# Patient Record
Sex: Male | Born: 1945 | Race: White | Hispanic: No | Marital: Married | Smoking: Former smoker
Health system: Southern US, Community
[De-identification: ages and names within clinical notes are randomized; demographics above are authoritative.]

## PROBLEM LIST (undated history)

## (undated) DIAGNOSIS — N400 Enlarged prostate without lower urinary tract symptoms: Secondary | ICD-10-CM

## (undated) DIAGNOSIS — N183 Chronic kidney disease, stage 3 unspecified: Secondary | ICD-10-CM

## (undated) DIAGNOSIS — I219 Acute myocardial infarction, unspecified: Secondary | ICD-10-CM

## (undated) DIAGNOSIS — E119 Type 2 diabetes mellitus without complications: Secondary | ICD-10-CM

## (undated) DIAGNOSIS — E1142 Type 2 diabetes mellitus with diabetic polyneuropathy: Secondary | ICD-10-CM

## (undated) DIAGNOSIS — I129 Hypertensive chronic kidney disease with stage 1 through stage 4 chronic kidney disease, or unspecified chronic kidney disease: Secondary | ICD-10-CM

## (undated) DIAGNOSIS — I10 Essential (primary) hypertension: Secondary | ICD-10-CM

## (undated) DIAGNOSIS — I519 Heart disease, unspecified: Secondary | ICD-10-CM

## (undated) DIAGNOSIS — E785 Hyperlipidemia, unspecified: Secondary | ICD-10-CM

## (undated) HISTORY — PX: HERNIA REPAIR: SHX51

## (undated) HISTORY — DX: Type 2 diabetes mellitus without complications: E11.9

## (undated) HISTORY — DX: Essential (primary) hypertension: I10

## (undated) HISTORY — DX: Type 2 diabetes mellitus with diabetic polyneuropathy: E11.42

## (undated) HISTORY — DX: Heart disease, unspecified: I51.9

## (undated) HISTORY — DX: Hyperlipidemia, unspecified: E78.5

## (undated) HISTORY — DX: Chronic kidney disease, stage 3 unspecified: N18.30

## (undated) HISTORY — DX: Chronic kidney disease, stage 3 (moderate): N18.3

## (undated) HISTORY — DX: Acute myocardial infarction, unspecified: I21.9

## (undated) HISTORY — PX: CARDIAC SURGERY: SHX584

## (undated) HISTORY — DX: Hypertensive chronic kidney disease with stage 1 through stage 4 chronic kidney disease, or unspecified chronic kidney disease: I12.9

## (undated) HISTORY — DX: Benign prostatic hyperplasia without lower urinary tract symptoms: N40.0

## (undated) HISTORY — PX: ANGIOPLASTY: SHX39

---

## 1989-04-07 HISTORY — PX: CORONARY ARTERY BYPASS GRAFT: SHX141

## 2011-12-30 DIAGNOSIS — I10 Essential (primary) hypertension: Secondary | ICD-10-CM | POA: Insufficient documentation

## 2013-04-27 ENCOUNTER — Ambulatory Visit (INDEPENDENT_AMBULATORY_CARE_PROVIDER_SITE_OTHER): Payer: Medicare Other

## 2013-04-27 ENCOUNTER — Ambulatory Visit (INDEPENDENT_AMBULATORY_CARE_PROVIDER_SITE_OTHER): Payer: Medicare Other | Admitting: Podiatry

## 2013-04-27 ENCOUNTER — Encounter: Payer: Self-pay | Admitting: Podiatry

## 2013-04-27 VITALS — BP 109/66 | HR 67 | Resp 16 | Ht 67.0 in | Wt 145.0 lb

## 2013-04-27 DIAGNOSIS — M79673 Pain in unspecified foot: Secondary | ICD-10-CM

## 2013-04-27 DIAGNOSIS — M79609 Pain in unspecified limb: Secondary | ICD-10-CM

## 2013-04-27 DIAGNOSIS — E1149 Type 2 diabetes mellitus with other diabetic neurological complication: Secondary | ICD-10-CM

## 2013-04-27 MED ORDER — GABAPENTIN 300 MG PO CAPS: ORAL_CAPSULE | ORAL | Status: DC

## 2013-04-27 NOTE — Progress Notes (Signed)
   Subjective:    Patient ID: Matthew CreaseJerry Wolf, male    DOB: 01-11-1946, 68 y.o.   MRN: [id number]  HPI Comments: My toes on both of my feet hurt. They have been hurting since 2011 and they have gotten worse over the years. i took lyrica for my feet and it didn't help. Dr Al Corpushyatt gave me neurontin and mobic and it didn't help either. i lay on my stomach and let my feet hang straight down and that gives me some relief. i also soak in epson salt and that's it.  Foot Pain      Review of Systems  Constitutional: Negative.   HENT: Negative.   Eyes: Positive for itching.  Respiratory: Negative.   Cardiovascular: Negative.   Gastrointestinal: Negative.   Endocrine: Negative.   Genitourinary: Negative.   Musculoskeletal: Negative.   Skin: Negative.   Allergic/Immunologic: Negative.   Neurological: Negative.   Hematological: Negative.   Psychiatric/Behavioral: Negative.        Objective:   Physical Exam: I have reviewed his past medical history medications allergies surgeries and social history. Pulses are strongly palpable bilateral. Neurologic sensorium per Semmes-Weinstein monofilament is diminished to the toes bilateral. Deep tendon reflexes are in non-elicitable. Muscle strength is 5 over 5 dorsiflexors plantar flexors inverters everters all intrinsic musculature is intact. Orthopedic evaluation demonstrates all joints distal to the ankle have a full range of motion without crepitus he does have hallux interphalangeum as well as hammertoe deformities 2 through 5 bilateral. Cutaneous evaluation demonstrates supple well hydrated cutis there is no erythema edema saline is drainage or odor.        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  Plan: Discussed etiology pathology conservative versus surgical therapies. At this point we are going to restart him on Neurontin. Neurontin 300 mg 1 twice daily and I will followup with him in one  month.

## 2013-05-25 ENCOUNTER — Ambulatory Visit (INDEPENDENT_AMBULATORY_CARE_PROVIDER_SITE_OTHER): Payer: Medicare Other | Admitting: Podiatry

## 2013-05-25 VITALS — BP 111/63 | HR 70 | Resp 16 | Ht 66.0 in | Wt 145.0 lb

## 2013-05-25 DIAGNOSIS — E1149 Type 2 diabetes mellitus with other diabetic neurological complication: Secondary | ICD-10-CM

## 2013-05-25 MED ORDER — GABAPENTIN 300 MG PO CAPS: ORAL_CAPSULE | ORAL | Status: DC

## 2013-05-25 NOTE — Progress Notes (Signed)
He has been taking his gabapentin twice daily, 1 300 mg capsule at lunch time and another 300 mg capsule at bedtime. He states that he really has not noticed a big difference other than being able to sleep through the night.  Objective: Vital signs are stable he is alert and oriented x3 pulses are strongly palpable bilateral foot. No change in neurological status.  Assessment: Diabetic peripheral neuropathy right greater than left.  Plan: Discussed etiology pathology conservative versus surgical therapies I increase his gabapentin to 600 mg twice daily and I will followup with him in one month at which time we will discuss the possible need for orthotics do his job type.

## 2013-06-27 ENCOUNTER — Ambulatory Visit: Payer: Medicare Other | Admitting: Podiatry

## 2013-07-26 ENCOUNTER — Ambulatory Visit: Payer: Self-pay | Admitting: Urology

## 2013-08-04 ENCOUNTER — Ambulatory Visit: Payer: Self-pay | Admitting: Gastroenterology

## 2013-08-04 LAB — HM COLONOSCOPY

## 2013-08-05 LAB — PATHOLOGY REPORT

## 2014-01-05 ENCOUNTER — Other Ambulatory Visit: Payer: Self-pay | Admitting: *Deleted

## 2014-01-05 MED ORDER — GABAPENTIN 300 MG PO CAPS: ORAL_CAPSULE | ORAL | Status: DC

## 2014-01-05 NOTE — Telephone Encounter (Signed)
Ok to refill gabapentin 

## 2014-07-29 ENCOUNTER — Other Ambulatory Visit: Payer: Self-pay | Admitting: Podiatry

## 2014-08-16 ENCOUNTER — Ambulatory Visit
Admission: RE | Admit: 2014-08-16 | Discharge: 2014-08-16 | Disposition: A | Discharge status: Home / Self Care | Payer: Medicare Other | Source: Ambulatory Visit | Attending: Family Medicine | Admitting: Family Medicine

## 2014-08-16 ENCOUNTER — Other Ambulatory Visit: Payer: Self-pay | Admitting: Family Medicine

## 2014-08-16 DIAGNOSIS — R6883 Chills (without fever): Secondary | ICD-10-CM | POA: Diagnosis not present

## 2014-08-16 DIAGNOSIS — R05 Cough: Secondary | ICD-10-CM | POA: Insufficient documentation

## 2014-08-16 DIAGNOSIS — Z951 Presence of aortocoronary bypass graft: Secondary | ICD-10-CM | POA: Diagnosis not present

## 2014-08-16 DIAGNOSIS — N183 Chronic kidney disease, stage 3 (moderate): Secondary | ICD-10-CM | POA: Diagnosis not present

## 2014-08-16 DIAGNOSIS — R059 Cough, unspecified: Secondary | ICD-10-CM

## 2014-08-16 DIAGNOSIS — D72829 Elevated white blood cell count, unspecified: Secondary | ICD-10-CM | POA: Diagnosis not present

## 2014-08-16 DIAGNOSIS — R509 Fever, unspecified: Secondary | ICD-10-CM | POA: Diagnosis not present

## 2014-08-16 DIAGNOSIS — M791 Myalgia: Secondary | ICD-10-CM | POA: Diagnosis not present

## 2014-08-16 DIAGNOSIS — E1129 Type 2 diabetes mellitus with other diabetic kidney complication: Secondary | ICD-10-CM | POA: Diagnosis not present

## 2014-08-16 DIAGNOSIS — R634 Abnormal weight loss: Secondary | ICD-10-CM | POA: Diagnosis not present

## 2014-08-18 DIAGNOSIS — I129 Hypertensive chronic kidney disease with stage 1 through stage 4 chronic kidney disease, or unspecified chronic kidney disease: Secondary | ICD-10-CM | POA: Diagnosis not present

## 2014-08-18 DIAGNOSIS — N182 Chronic kidney disease, stage 2 (mild): Secondary | ICD-10-CM | POA: Diagnosis not present

## 2014-08-18 DIAGNOSIS — E1140 Type 2 diabetes mellitus with diabetic neuropathy, unspecified: Secondary | ICD-10-CM | POA: Diagnosis not present

## 2014-08-18 DIAGNOSIS — E785 Hyperlipidemia, unspecified: Secondary | ICD-10-CM | POA: Diagnosis not present

## 2014-09-04 DIAGNOSIS — H3531 Nonexudative age-related macular degeneration: Secondary | ICD-10-CM | POA: Diagnosis not present

## 2014-09-04 DIAGNOSIS — H40003 Preglaucoma, unspecified, bilateral: Secondary | ICD-10-CM | POA: Diagnosis not present

## 2014-10-12 ENCOUNTER — Telehealth: Payer: Self-pay | Admitting: Unknown Physician Specialty

## 2014-10-12 NOTE — Telephone Encounter (Signed)
Routing to provider. Elnita MaxwellCheryl wrote this rx for the patient but Dr. Sherie DonLada is the PCP according to practice partner. Practice partner number is [phone], pharmacy [facility], and patient was last seen on 08/18/14 with Dr. Sherie DonLada.

## 2014-10-12 NOTE — Telephone Encounter (Signed)
I do believe this is your patient. Sent to you!

## 2014-10-12 NOTE — Telephone Encounter (Signed)
[facility] [place] [facility] stated pt needs refill on: Metformin.   Thanks.

## 2014-10-13 MED ORDER — METFORMIN HCL 1000 MG PO TABS: 1000.0000 mg | ORAL_TABLET | Freq: Two times a day (BID) | ORAL | Status: DC

## 2014-10-13 NOTE — Telephone Encounter (Signed)
Last creatinine 0.82; approved

## 2014-11-20 ENCOUNTER — Other Ambulatory Visit: Payer: Self-pay | Admitting: Family Medicine

## 2014-11-20 NOTE — Telephone Encounter (Signed)
E-fax came through for refill on: Rx: metFORMIN (GLUCOPHAGE) 1000 MG tablet Copy in basket.

## 2014-11-21 MED ORDER — METFORMIN HCL 1000 MG PO TABS: 1000.0000 mg | ORAL_TABLET | Freq: Two times a day (BID) | ORAL | Status: DC

## 2014-11-21 NOTE — Telephone Encounter (Signed)
He would like rx for Metformin changed to a 90 day supply.

## 2014-11-21 NOTE — Telephone Encounter (Signed)
Last creatinine reviewed; he has appt August 25th (next week) Rx approved

## 2014-11-23 DIAGNOSIS — I10 Essential (primary) hypertension: Secondary | ICD-10-CM | POA: Insufficient documentation

## 2014-11-23 DIAGNOSIS — E1142 Type 2 diabetes mellitus with diabetic polyneuropathy: Secondary | ICD-10-CM | POA: Insufficient documentation

## 2014-11-23 DIAGNOSIS — N182 Chronic kidney disease, stage 2 (mild): Secondary | ICD-10-CM

## 2014-11-23 DIAGNOSIS — I152 Hypertension secondary to endocrine disorders: Secondary | ICD-10-CM | POA: Insufficient documentation

## 2014-11-23 DIAGNOSIS — E1122 Type 2 diabetes mellitus with diabetic chronic kidney disease: Secondary | ICD-10-CM | POA: Insufficient documentation

## 2014-11-23 DIAGNOSIS — E785 Hyperlipidemia, unspecified: Secondary | ICD-10-CM

## 2014-11-23 DIAGNOSIS — I129 Hypertensive chronic kidney disease with stage 1 through stage 4 chronic kidney disease, or unspecified chronic kidney disease: Secondary | ICD-10-CM | POA: Insufficient documentation

## 2014-11-23 DIAGNOSIS — E119 Type 2 diabetes mellitus without complications: Secondary | ICD-10-CM | POA: Insufficient documentation

## 2014-11-23 DIAGNOSIS — E1169 Type 2 diabetes mellitus with other specified complication: Secondary | ICD-10-CM | POA: Insufficient documentation

## 2014-11-30 ENCOUNTER — Ambulatory Visit (INDEPENDENT_AMBULATORY_CARE_PROVIDER_SITE_OTHER): Payer: Medicare Other | Admitting: Family Medicine

## 2014-11-30 ENCOUNTER — Encounter: Payer: Self-pay | Admitting: Family Medicine

## 2014-11-30 VITALS — BP 111/67 | HR 62 | Temp 97.9°F | Ht 65.75 in | Wt 139.0 lb

## 2014-11-30 DIAGNOSIS — E785 Hyperlipidemia, unspecified: Secondary | ICD-10-CM | POA: Diagnosis not present

## 2014-11-30 DIAGNOSIS — I10 Essential (primary) hypertension: Secondary | ICD-10-CM

## 2014-11-30 DIAGNOSIS — G629 Polyneuropathy, unspecified: Secondary | ICD-10-CM | POA: Diagnosis not present

## 2014-11-30 DIAGNOSIS — E1342 Other specified diabetes mellitus with diabetic polyneuropathy: Secondary | ICD-10-CM | POA: Diagnosis not present

## 2014-11-30 DIAGNOSIS — N183 Chronic kidney disease, stage 3 unspecified: Secondary | ICD-10-CM

## 2014-11-30 DIAGNOSIS — Z5181 Encounter for therapeutic drug level monitoring: Secondary | ICD-10-CM | POA: Diagnosis not present

## 2014-11-30 DIAGNOSIS — E1142 Type 2 diabetes mellitus with diabetic polyneuropathy: Secondary | ICD-10-CM

## 2014-11-30 DIAGNOSIS — R748 Abnormal levels of other serum enzymes: Secondary | ICD-10-CM

## 2014-11-30 DIAGNOSIS — D72829 Elevated white blood cell count, unspecified: Secondary | ICD-10-CM | POA: Diagnosis not present

## 2014-11-30 DIAGNOSIS — I2510 Atherosclerotic heart disease of native coronary artery without angina pectoris: Secondary | ICD-10-CM

## 2014-11-30 MED ORDER — GABAPENTIN 600 MG PO TABS: 600.0000 mg | ORAL_TABLET | Freq: Three times a day (TID) | ORAL | Status: DC

## 2014-11-30 NOTE — Assessment & Plan Note (Signed)
Elevated when sick last visit; recheck to ensure return to baseline

## 2014-11-30 NOTE — Patient Instructions (Addendum)
For the next 5 days, take one gabapentin pill in the morning, two in the early afternoon, and two at bedtime Then increase to 600 mg three times a day New prescription was sent to your pharmacy Do not ever stop the gabapentin cold [place] Return next week one morning for fasting labs for cholesterol and diabetes Check feet every night See Dr. Clydene Pugh Try to limit salt Return in 3-1/2 months (about 14 weeks) for your next follow-up for diabetes if your A1C is 7 or higher, or we can see you back in 6-1/2 months if A1C is less than 6; we'll see what your labs show

## 2014-11-30 NOTE — Assessment & Plan Note (Signed)
Check fasting lipids next week; ideal LDL less than 70; limit saturated fats; continue statin

## 2014-11-30 NOTE — Progress Notes (Signed)
BP 111/67 mmHg  Pulse 62  Temp(Src) 97.9 F (36.6 C)  Ht 5' 5.75" (1.67 m)  Wt 139 lb (63.05 kg)  BMI 22.61 kg/m2  SpO2 97%   Subjective:    Patient ID: Matthew Wolf, male    DOB: 06-Oct-1945, 69 y.o.   MRN: [id number]  HPI: Matthew Wolf is a 69 y.o. male  Chief Complaint  Patient presents with  . Hypertension  . Hyperlipidemia  . Diabetes    Diabetes; not checking FSBS per my blessing; some dry mouth; no frequent urination; some blurred vision yesterday with stress; neuropathy in both feet, worse on the bottom, burning sensation; already on 600 mg of gabepentin BID; last eye exam UTD with Dr. Clydene Pugh, sees him every 6 months  High blood pressure; well-controlled today; does use some salt; avoids decongestants; uses Aleve every once in a while, not very often  High cholesterol; on statin; no muscle aches or abd pain; no hot dogs, bologna; might eat two eggs a week; just doesn't eat steak, maybe one a month  Coronary artery disease; he has been on 80 mg of atorvastatin ever since after his heart attack; no chest pain; [facility]son [facility] at [facility]; goes to see him December 1st  Relevant past medical, surgical, family and social history reviewed and updated as indicated. Interim medical history since our last visit reviewed. Allergies and medications reviewed and updated.  Review of Systems  Constitutional: Negative for unexpected weight change.  HENT: Negative for hearing loss.   Eyes: Positive for visual disturbance (blurred vision for just a bit yesterday; sees Dr. Clydene Pugh every 6 months).  Respiratory: Negative for wheezing.   Cardiovascular: Negative for chest pain and leg swelling.  Gastrointestinal: Negative for blood in stool.  Endocrine: Negative for cold intolerance and polydipsia.  Genitourinary: Negative for dysuria.  Skin: Negative for wound.  Allergic/Immunologic: Negative for environmental allergies.  Neurological: Positive for numbness (burning in both feet).   Hematological: Does not bruise/bleed easily.  Psychiatric/Behavioral: Negative for sleep disturbance.    Per HPI unless specifically indicated above     Objective:    BP 111/67 mmHg  Pulse 62  Temp(Src) 97.9 F (36.6 C)  Ht 5' 5.75" (1.67 m)  Wt 139 lb (63.05 kg)  BMI 22.61 kg/m2  SpO2 97%  Wt Readings from Last 3 Encounters:  11/30/14 139 lb (63.05 kg)  08/18/14 140 lb (63.504 kg)  05/25/13 145 lb (65.772 kg)    Physical Exam  Constitutional: He appears well-developed and well-nourished. No distress.  Small framed; feet size 5  HENT:  Head: Normocephalic and atraumatic.  Eyes: EOM are normal. No scleral icterus.  Neck: No thyromegaly present.  Cardiovascular: Normal rate and regular rhythm.   Pulmonary/Chest: Effort normal and breath sounds normal.  Abdominal: Soft. Bowel sounds are normal. He exhibits no distension.  Musculoskeletal: He exhibits no edema.  Neurological: Coordination normal.  Skin: Skin is warm and dry. No pallor.  Psychiatric: He has a normal mood and affect. His behavior is normal. Judgment and thought content normal.   Diabetic Foot Form - Detailed   Diabetic Foot Exam - detailed  Diabetic Foot exam was performed with the following findings:  Yes 11/30/2014  4:57 PM  Visual Foot Exam completed.:  Yes  Is there a history of foot ulcer?:  No  Can the patient see the bottom of their feet?:  Yes  Are the shoes appropriate in style and fit?:  Yes  Is there swelling or and  abnormal foot shape?:  No  Are the toenails long?:  No  Are the toenails thick?:  No  Are the toenails ingrown?:  No    Pulse Foot Exam completed.:  Yes  Right Dorsalis Pedis:  Present Left Dorsalis Pedis:  Present  Sensory Foot Exam Completed.:  Yes  Semmes-Weinstein Monofilament Test  R Site 1-Great Toe:  Neg L Site 1-Great Toe:  Neg  R Site 4:  Neg L Site 4:  Neg  R Site 5:  Neg L Site 5:  Neg       Results for orders placed or performed in visit on 11/23/14  HM  COLONOSCOPY  Result Value Ref Range   HM Colonoscopy per PP, repeat in 5 years       Assessment & Plan:   Problem List Items Addressed This Visit      Cardiovascular and Mediastinum   Essential hypertension, benign    Well-controlled on beta-blocker and ACE-I; patient does not want to decrease the dose; he is well-controlled despite not limiting salt much; avoid decongestants      Coronary artery disease    No chest pain; on aspirin, statin, beta-blocker; sees cardiologist yearly        Endocrine   Diabetic peripheral neuropathy - Primary    Increase the gabapentin from 0-600-600 to 300-600-600 for five days and then 600-600-600; new Rx sent; explained to never ever stop this medicine cold [place], could cause seizure; will check A1C to see if better controlled; A1C less than 7.5 at least, and under 7 would be ideal; foot exam by MD today; patient checking feet regularly; he sees eye doctor every six months      Relevant Medications   gabapentin (NEURONTIN) 600 MG tablet   Other Relevant Orders   Hgb A1c w/o eAG   Microalbumin / creatinine urine ratio     Genitourinary   CKD (chronic kidney disease) stage 3, GFR 30-59 ml/min    Check creatinine and GFR; continue ACE-I; he uses NSAID very sparingly      Relevant Orders   Microalbumin / creatinine urine ratio     Other   Hyperlipidemia    Check fasting lipids next week; ideal LDL less than 70; limit saturated fats; continue statin      Relevant Orders   Lipid Panel w/o Chol/HDL Ratio   Medication monitoring encounter    Check sgpt and creatinine and K+ (on statin and ACE-I, respectively)      Relevant Orders   Comprehensive metabolic panel   Alkaline phosphatase elevation    Check level, very mild elevation last time when he was ill      Relevant Orders   Comprehensive metabolic panel   Leukocytosis    Elevated when sick last visit; recheck to ensure return to baseline      Relevant Orders   CBC with  Differential/Platelet      Follow up plan: Return in about 14 weeks (around 03/08/2015) for diabetes and cholesterol.   Meds ordered this encounter  Medications  . gabapentin (NEURONTIN) 600 MG tablet    Sig: Take 1 tablet (600 mg total) by mouth 3 (three) times daily.    Dispense:  90 tablet    Refill:  3    Pharmacist -- I'm taking this over and increasing dose   Orders Placed This Encounter  Procedures  . Hgb A1c w/o eAG  . Comprehensive metabolic panel  . CBC with Differential/Platelet  . Lipid Panel w/o Chol/HDL Ratio  .  Microalbumin / creatinine urine ratio

## 2014-11-30 NOTE — Assessment & Plan Note (Signed)
Check sgpt and creatinine and K+ (on statin and ACE-I, respectively)

## 2014-11-30 NOTE — Assessment & Plan Note (Signed)
Well-controlled on beta-blocker and ACE-I; patient does not want to decrease the dose; he is well-controlled despite not limiting salt much; avoid decongestants

## 2014-11-30 NOTE — Assessment & Plan Note (Signed)
Check creatinine and GFR; continue ACE-I; he uses NSAID very sparingly

## 2014-11-30 NOTE — Assessment & Plan Note (Signed)
Check level, very mild elevation last time when he was ill

## 2014-11-30 NOTE — Assessment & Plan Note (Addendum)
No chest pain; on aspirin, statin, beta-blocker; sees cardiologist yearly

## 2014-11-30 NOTE — Assessment & Plan Note (Signed)
Increase the gabapentin from 0-600-600 to 300-600-600 for five days and then 600-600-600; new Rx sent; explained to never ever stop this medicine cold [place], could cause seizure; will check A1C to see if better controlled; A1C less than 7.5 at least, and under 7 would be ideal; foot exam by MD today; patient checking feet regularly; he sees eye doctor every six months

## 2014-12-04 ENCOUNTER — Other Ambulatory Visit: Payer: Medicare Other

## 2014-12-04 DIAGNOSIS — E785 Hyperlipidemia, unspecified: Secondary | ICD-10-CM

## 2014-12-04 DIAGNOSIS — G629 Polyneuropathy, unspecified: Secondary | ICD-10-CM | POA: Diagnosis not present

## 2014-12-04 DIAGNOSIS — E1142 Type 2 diabetes mellitus with diabetic polyneuropathy: Secondary | ICD-10-CM

## 2014-12-04 DIAGNOSIS — Z5181 Encounter for therapeutic drug level monitoring: Secondary | ICD-10-CM

## 2014-12-04 DIAGNOSIS — D72829 Elevated white blood cell count, unspecified: Secondary | ICD-10-CM

## 2014-12-04 DIAGNOSIS — R748 Abnormal levels of other serum enzymes: Secondary | ICD-10-CM | POA: Diagnosis not present

## 2014-12-04 DIAGNOSIS — E1342 Other specified diabetes mellitus with diabetic polyneuropathy: Secondary | ICD-10-CM | POA: Diagnosis not present

## 2014-12-04 DIAGNOSIS — N183 Chronic kidney disease, stage 3 unspecified: Secondary | ICD-10-CM

## 2014-12-05 ENCOUNTER — Telehealth: Payer: Self-pay | Admitting: Family Medicine

## 2014-12-05 LAB — CBC WITH DIFFERENTIAL/PLATELET
BASOS ABS: 0 10*3/uL (ref 0.0–0.2)
Basos: 1 %
EOS (ABSOLUTE): 0.2 10*3/uL (ref 0.0–0.4)
EOS: 3 %
Hematocrit: 39.8 % (ref 37.5–51.0)
Hemoglobin: 13.8 g/dL (ref 12.6–17.7)
IMMATURE GRANULOCYTES: 0 %
Immature Grans (Abs): 0 10*3/uL (ref 0.0–0.1)
Lymphocytes Absolute: 2.6 10*3/uL (ref 0.7–3.1)
Lymphs: 31 %
MCH: 32 pg (ref 26.6–33.0)
MCHC: 34.7 g/dL (ref 31.5–35.7)
MCV: 92 fL (ref 79–97)
MONOS ABS: 0.6 10*3/uL (ref 0.1–0.9)
Monocytes: 7 %
NEUTROS PCT: 58 %
Neutrophils Absolute: 4.7 10*3/uL (ref 1.4–7.0)
PLATELETS: 241 10*3/uL (ref 150–379)
RBC: 4.31 x10E6/uL (ref 4.14–5.80)
RDW: 13.1 % (ref 12.3–15.4)
WBC: 8.2 10*3/uL (ref 3.4–10.8)

## 2014-12-05 LAB — COMPREHENSIVE METABOLIC PANEL
ALK PHOS: 104 IU/L (ref 39–117)
ALT: 18 IU/L (ref 0–44)
AST: 15 IU/L (ref 0–40)
Albumin/Globulin Ratio: 2 (ref 1.1–2.5)
Albumin: 4.5 g/dL (ref 3.5–4.8)
BUN/Creatinine Ratio: 12 (ref 10–22)
BUN: 10 mg/dL (ref 8–27)
Bilirubin Total: 0.4 mg/dL (ref 0.0–1.2)
CHLORIDE: 100 mmol/L (ref 97–108)
CO2: 21 mmol/L (ref 18–29)
CREATININE: 0.83 mg/dL (ref 0.76–1.27)
Calcium: 9.6 mg/dL (ref 8.6–10.2)
GFR calc Af Amer: 102 mL/min/{1.73_m2} (ref >59)
GFR calc non Af Amer: 89 mL/min/{1.73_m2} (ref >59)
GLOBULIN, TOTAL: 2.2 g/dL (ref 1.5–4.5)
Glucose: 183 mg/dL — ABNORMAL HIGH (ref 65–99)
POTASSIUM: 4.9 mmol/L (ref 3.5–5.2)
SODIUM: 140 mmol/L (ref 134–144)
Total Protein: 6.7 g/dL (ref 6.0–8.5)

## 2014-12-05 LAB — MICROALBUMIN / CREATININE URINE RATIO
CREATININE, UR: 86.7 mg/dL
MICROALB/CREAT RATIO: 29 mg/g creat (ref 0.0–30.0)
MICROALBUM., U, RANDOM: 25.1 ug/mL

## 2014-12-05 LAB — LIPID PANEL W/O CHOL/HDL RATIO
Cholesterol, Total: 124 mg/dL (ref 100–199)
HDL: 34 mg/dL — AB (ref >39)
LDL Calculated: 64 mg/dL (ref 0–99)
Triglycerides: 130 mg/dL (ref 0–149)
VLDL CHOLESTEROL CAL: 26 mg/dL (ref 5–40)

## 2014-12-05 LAB — HGB A1C W/O EAG: Hgb A1c MFr Bld: 8 % — ABNORMAL HIGH (ref 4.8–5.6)

## 2014-12-05 NOTE — Telephone Encounter (Signed)
I talked with patient about lab results His A1C is 8, down from 8.8 in May; recheck in 3 months Offered another medicine, but he declined and will stay on the two he's got and avoid simple carbs; reviewed those in detail He does not want additional diabetic teaching LDL better; HDL not quite high enough, try walking but don't be extreme

## 2015-02-17 ENCOUNTER — Other Ambulatory Visit: Payer: Self-pay | Admitting: Family Medicine

## 2015-02-19 NOTE — Telephone Encounter (Signed)
Your patient.  Thanks 

## 2015-02-19 NOTE — Telephone Encounter (Signed)
Creatinine 0.83 August 2016; Rx approved

## 2015-03-09 ENCOUNTER — Ambulatory Visit: Payer: Medicare Other | Admitting: Family Medicine

## 2015-03-09 DIAGNOSIS — I2510 Atherosclerotic heart disease of native coronary artery without angina pectoris: Secondary | ICD-10-CM | POA: Diagnosis not present

## 2015-03-09 DIAGNOSIS — E782 Mixed hyperlipidemia: Secondary | ICD-10-CM | POA: Diagnosis not present

## 2015-03-09 DIAGNOSIS — I10 Essential (primary) hypertension: Secondary | ICD-10-CM | POA: Diagnosis not present

## 2015-03-12 ENCOUNTER — Ambulatory Visit (INDEPENDENT_AMBULATORY_CARE_PROVIDER_SITE_OTHER): Payer: Medicare Other | Admitting: Family Medicine

## 2015-03-12 ENCOUNTER — Encounter: Payer: Self-pay | Admitting: Family Medicine

## 2015-03-12 VITALS — BP 125/77 | HR 55 | Temp 97.3°F | Wt 137.0 lb

## 2015-03-12 DIAGNOSIS — I129 Hypertensive chronic kidney disease with stage 1 through stage 4 chronic kidney disease, or unspecified chronic kidney disease: Secondary | ICD-10-CM | POA: Diagnosis not present

## 2015-03-12 DIAGNOSIS — I10 Essential (primary) hypertension: Secondary | ICD-10-CM | POA: Diagnosis not present

## 2015-03-12 DIAGNOSIS — Z125 Encounter for screening for malignant neoplasm of prostate: Secondary | ICD-10-CM | POA: Diagnosis not present

## 2015-03-12 DIAGNOSIS — R634 Abnormal weight loss: Secondary | ICD-10-CM

## 2015-03-12 DIAGNOSIS — E1142 Type 2 diabetes mellitus with diabetic polyneuropathy: Secondary | ICD-10-CM | POA: Diagnosis not present

## 2015-03-12 DIAGNOSIS — I2510 Atherosclerotic heart disease of native coronary artery without angina pectoris: Secondary | ICD-10-CM

## 2015-03-12 DIAGNOSIS — E119 Type 2 diabetes mellitus without complications: Secondary | ICD-10-CM

## 2015-03-12 LAB — BAYER DCA HB A1C WAIVED: HB A1C (BAYER DCA - WAIVED): 7.2 % — ABNORMAL HIGH (ref <7.0)

## 2015-03-12 MED ORDER — METFORMIN HCL ER 500 MG PO TB24: ORAL_TABLET | ORAL | Status: DC

## 2015-03-12 MED ORDER — GABAPENTIN 600 MG PO TABS: ORAL_TABLET | ORAL | Status: DC

## 2015-03-12 NOTE — Patient Instructions (Addendum)
Do check your feet every night Keep a food diary Return in the next week for additional labs Increase the night-time gabapentin to 1.5 pills (that will equal 900 mg) I'm pleased with how you brought down your A1c Keep up the good work We'll change the metformin to the longer acting version and have you take 1000 mg in the morning and 1000 mg in the evening Return in 6 months for 30 minute visit for diabetes Return for lab in the next week (nonfasting)

## 2015-03-12 NOTE — Progress Notes (Signed)
BP 125/77 mmHg  Pulse 55  Temp(Src) 97.3 F (36.3 C)  Wt 137 lb (62.143 kg)  SpO2 100%  Subjective:    Patient ID: Matthew Wolf, male    DOB: 1946/02/18, 69 y.o.   MRN: [id number]  HPI: Matthew Wolf is a 69 y.o. male  Chief Complaint  Patient presents with  . Diabetes  . Hypertension  . Hyperlipidemia   Diabetes; type 2; has diabetic neuropathy; burning and pain from the balls of the feet distally; worse at night; no sugary drinks; no low sugars Lab Results  Component Value Date   HGBA1C 8.0* 12/04/2014   Coronary arter disease; he just saw his cardiologist last week; had 3 vessel bypass and angioplasties; they told him everything was fine; his mother passed away at age 48 Lab Results  Component Value Date   CHOL 124 12/04/2014   HDL 34* 12/04/2014   LDLCALC 64 12/04/2014   TRIG 130 12/04/2014    Dyslipidemia; has eggs once a week, bacon once a week; reviewed last lipid panel (HDL 26) at [facility]  When he gets through eating, he goes straight to the bathroom, then 2-3 times after that, then doesn't pass anything but water; some days, he is just fine; not sure if any food that he can associate; nothing really new; city water; no mucous, no blood; drinks bottled water to work; going on for 4-5 months; takes prilosec  Stage 3 CKD in the chart, but last GFR was 89 so he's actually stage 2 CKD Lab Results  Component Value Date   CREATININE 0.83 12/04/2014    Relevant past medical, surgical, family and social history reviewed and updated as indicated. Mother had heart attack at age 21; no known thyroid disease in the family  Interim medical history since our last visit reviewed. Saw cardiologist; we reviewed those labs today that were drawn March 09, 2015 Allergies and medications reviewed and updated.  Review of Systems  Constitutional: Positive for unexpected weight change (lost maybe 13 pounds over the last year).  Gastrointestinal: Positive for diarrhea. Negative  for blood in stool.   Per HPI unless specifically indicated above     Objective:    BP 125/77 mmHg  Pulse 55  Temp(Src) 97.3 F (36.3 C)  Wt 137 lb (62.143 kg)  SpO2 100%  Wt Readings from Last 3 Encounters:  03/12/15 137 lb (62.143 kg)  11/30/14 139 lb (63.05 kg)  08/18/14 140 lb (63.504 kg)  body mass index is 22.28 kg/(m^2).  Physical Exam  Constitutional: He appears well-developed and well-nourished. No distress.  Loss of three pounds over last 6-1/2 months  HENT:  Mouth/Throat: Oropharynx is clear and moist.  Eyes: EOM are normal. No scleral icterus.  Neck: No JVD present.  Cardiovascular: Normal rate and regular rhythm.   Pulmonary/Chest: Effort normal and breath sounds normal.  Abdominal: Soft. He exhibits no distension.  Musculoskeletal: He exhibits no edema.  Neurological: He is alert. He displays no tremor. A sensory deficit (diminished in toes, right foot > left foot) is present. He exhibits normal muscle tone.  Skin: No pallor.  Psychiatric: He has a normal mood and affect. His behavior is normal. Judgment and thought content normal.   Diabetic Foot Form - Detailed   Diabetic Foot Exam - detailed  Diabetic Foot exam was performed with the following findings:  Yes 03/12/2015  4:34 PM  Visual Foot Exam completed.:  Yes  Are the toenails long?:  No  Are the toenails thick?:  No  Are the toenails ingrown?:  No    Pulse Foot Exam completed.:  Yes  Right Dorsalis Pedis:  Present Left Dorsalis Pedis:  Present  Sensory Foot Exam Completed.:  Yes  Semmes-Weinstein Monofilament Test  R Site 1-Great Toe:  Pos L Site 1-Great Toe:  Pos  R Site 4:  Pos L Site 4:  Pos  R Site 5:  Pos L Site 5:  Pos        Results for orders placed or performed in visit on 03/12/15  Bayer DCA Hb A1c Waived (STAT)  Result Value Ref Range   Bayer DCA Hb A1c Waived 7.2 (H) <7.0 %      Assessment & Plan:   Problem List Items Addressed This Visit      Cardiovascular and  Mediastinum   Essential hypertension, benign    Excellent control; continue current medicines; DASH guidelines      Relevant Medications   aspirin EC 325 MG tablet   Coronary artery disease    Recently seen by cardiologist; goal LDL less than 70; continue 80 mg atorvastatin; next lipids can be checked in late February (every 6 months) along with sgpt; continue aspirin 325 mg daily and beta-blocker      Relevant Medications   aspirin EC 325 MG tablet     Endocrine   Diabetic peripheral neuropathy (HCC)    Will increase night-time gabapentin; try to keep A1c controlled, obviously; foot exam by MD today, patient to check feet every night      Relevant Medications   aspirin EC 325 MG tablet   gabapentin (NEURONTIN) 600 MG tablet   metFORMIN (GLUCOPHAGE-XR) 500 MG 24 hr tablet     Genitourinary   Hypertensive kidney disease    Stage 2 CKD; last urine microalbumin reviewed (Aug 2016) and creatinine reviewed; avoid NSAIDs; keep glucose and BP and lipids controlled        Other   Weight loss    Reviewed last set of labs; will have him return for thyroid testing, PSA, CMP, celiac panel; colonoscopy was just done April 2015, next due April 2020 (I reviewed the report from Dr. Shelle Iron personally); consider thyroid disease as cardiac symptoms such as tachycardia/palpitations which may be blocked or subdued by beta-blocker; notify me of any new sx or changes      Relevant Orders   TSH   Comprehensive metabolic panel   Celiac Disease Ab Screen w/Rfx   T4, free   Prostate cancer screening    With weight loss, will check PSA      Relevant Orders   PSA    Other Visit Diagnoses    Type 2 diabetes mellitus without complication, without long-term current use of insulin (HCC)    -  Primary    Relevant Medications    aspirin EC 325 MG tablet    metFORMIN (GLUCOPHAGE-XR) 500 MG 24 hr tablet    Other Relevant Orders    Bayer DCA Hb A1c Waived (STAT) (Completed)       Follow up  plan: Return in about 6 months (around 09/10/2015) for 30 minutes for fasting labs and diabetes, lab visit this week (NONfasting).  Orders Placed This Encounter  Procedures  . Bayer DCA Hb A1c Waived (STAT)  . PSA  . TSH  . Comprehensive metabolic panel  . Celiac Disease Ab Screen w/Rfx  . T4, free   An after-visit summary was printed and given to the patient at check-out.  Please see the patient  instructions which may contain other information and recommendations beyond what is mentioned above in the assessment and plan.

## 2015-03-14 DIAGNOSIS — E119 Type 2 diabetes mellitus without complications: Secondary | ICD-10-CM | POA: Diagnosis not present

## 2015-03-14 DIAGNOSIS — H353131 Nonexudative age-related macular degeneration, bilateral, early dry stage: Secondary | ICD-10-CM | POA: Diagnosis not present

## 2015-03-14 DIAGNOSIS — H2513 Age-related nuclear cataract, bilateral: Secondary | ICD-10-CM | POA: Diagnosis not present

## 2015-03-14 DIAGNOSIS — H40013 Open angle with borderline findings, low risk, bilateral: Secondary | ICD-10-CM | POA: Diagnosis not present

## 2015-03-14 LAB — HM DIABETES EYE EXAM

## 2015-03-16 ENCOUNTER — Ambulatory Visit: Payer: Medicare Other

## 2015-03-16 DIAGNOSIS — Z23 Encounter for immunization: Secondary | ICD-10-CM

## 2015-03-16 DIAGNOSIS — Z125 Encounter for screening for malignant neoplasm of prostate: Secondary | ICD-10-CM | POA: Insufficient documentation

## 2015-03-16 NOTE — Assessment & Plan Note (Signed)
Excellent control; continue current medicines; DASH guidelines

## 2015-03-16 NOTE — Assessment & Plan Note (Signed)
With weight loss, will check PSA

## 2015-03-16 NOTE — Assessment & Plan Note (Addendum)
Reviewed last set of labs; will have him return for thyroid testing, PSA, CMP, celiac panel; colonoscopy was just done April 2015, next due April 2020 (I reviewed the report from Dr. Shelle Ironein personally); consider thyroid disease as cardiac symptoms such as tachycardia/palpitations which may be blocked or subdued by beta-blocker; notify me of any new sx or changes

## 2015-03-16 NOTE — Assessment & Plan Note (Addendum)
Recently seen by cardiologist; goal LDL less than 70; continue 80 mg atorvastatin; next lipids can be checked in late February (every 6 months) along with sgpt; continue aspirin 325 mg daily and beta-blocker

## 2015-03-16 NOTE — Assessment & Plan Note (Signed)
Will increase night-time gabapentin; try to keep A1c controlled, obviously; foot exam by MD today, patient to check feet every night

## 2015-03-16 NOTE — Assessment & Plan Note (Signed)
Stage 2 CKD; last urine microalbumin reviewed (Aug 2016) and creatinine reviewed; avoid NSAIDs; keep glucose and BP and lipids controlled

## 2015-04-30 DIAGNOSIS — H40013 Open angle with borderline findings, low risk, bilateral: Secondary | ICD-10-CM | POA: Diagnosis not present

## 2015-04-30 DIAGNOSIS — H25012 Cortical age-related cataract, left eye: Secondary | ICD-10-CM | POA: Diagnosis not present

## 2015-04-30 DIAGNOSIS — H2512 Age-related nuclear cataract, left eye: Secondary | ICD-10-CM | POA: Diagnosis not present

## 2015-04-30 DIAGNOSIS — E119 Type 2 diabetes mellitus without complications: Secondary | ICD-10-CM | POA: Diagnosis not present

## 2015-04-30 LAB — HM DIABETES EYE EXAM

## 2015-05-15 DIAGNOSIS — H2512 Age-related nuclear cataract, left eye: Secondary | ICD-10-CM | POA: Diagnosis not present

## 2015-05-28 ENCOUNTER — Telehealth: Payer: Self-pay | Admitting: Family Medicine

## 2015-05-28 DIAGNOSIS — Z5181 Encounter for therapeutic drug level monitoring: Secondary | ICD-10-CM

## 2015-05-28 DIAGNOSIS — E785 Hyperlipidemia, unspecified: Secondary | ICD-10-CM

## 2015-05-28 DIAGNOSIS — R634 Abnormal weight loss: Secondary | ICD-10-CM

## 2015-05-28 DIAGNOSIS — Z125 Encounter for screening for malignant neoplasm of prostate: Secondary | ICD-10-CM

## 2015-05-28 NOTE — Assessment & Plan Note (Signed)
Ordering lipid

## 2015-05-28 NOTE — Assessment & Plan Note (Signed)
Check PSA. ?

## 2015-05-28 NOTE — Telephone Encounter (Signed)
Patient has several labs that still have not resulted; please ask him to come back in to have blood drawn It's almost time for his lipids to be rechecked, so I just entered that; this is in addition to the TSH, T4, PSA, CMP, and celiac tests that are outstanding

## 2015-05-28 NOTE — Telephone Encounter (Signed)
Great, thank you New orders entered

## 2015-05-28 NOTE — Telephone Encounter (Signed)
Called and left patient a voicemail asking for him to please return my call. Dr. Sherie Don, it says the TSH, T4, PSA, CMP, and celiac test orders have expired so I think they need to be re-entered. Please route this message back to me so we can get his lab appointment scheduled.

## 2015-05-28 NOTE — Telephone Encounter (Signed)
Previous orders have expired, please enter the previous orders for future

## 2015-05-28 NOTE — Telephone Encounter (Signed)
Patient returned call and scheduled lab appointment for tomorrow morning.

## 2015-05-28 NOTE — Assessment & Plan Note (Signed)
labs

## 2015-05-28 NOTE — Assessment & Plan Note (Signed)
Check thyroid, celiac, other labs

## 2015-05-29 ENCOUNTER — Other Ambulatory Visit: Payer: Medicare Other

## 2015-05-29 DIAGNOSIS — Z125 Encounter for screening for malignant neoplasm of prostate: Secondary | ICD-10-CM | POA: Diagnosis not present

## 2015-05-29 DIAGNOSIS — R634 Abnormal weight loss: Secondary | ICD-10-CM

## 2015-05-29 DIAGNOSIS — E785 Hyperlipidemia, unspecified: Secondary | ICD-10-CM | POA: Diagnosis not present

## 2015-05-29 DIAGNOSIS — Z5181 Encounter for therapeutic drug level monitoring: Secondary | ICD-10-CM | POA: Diagnosis not present

## 2015-05-30 LAB — CELIAC DISEASE AB SCREEN W/RFX
ANTIGLIADIN ABS, IGA: 2 U (ref 0–19)
IgA/Immunoglobulin A, Serum: 239 mg/dL (ref 61–437)
Transglutaminase IgA: <2 U/mL (ref 0–3)

## 2015-05-30 LAB — COMPREHENSIVE METABOLIC PANEL
ALT: 13 IU/L (ref 0–44)
AST: 14 IU/L (ref 0–40)
Albumin/Globulin Ratio: 2.4 (ref 1.1–2.5)
Albumin: 4.4 g/dL (ref 3.5–4.8)
Alkaline Phosphatase: 104 IU/L (ref 39–117)
BUN / CREAT RATIO: 18 (ref 10–22)
BUN: 14 mg/dL (ref 8–27)
Bilirubin Total: 0.5 mg/dL (ref 0.0–1.2)
CALCIUM: 8.9 mg/dL (ref 8.6–10.2)
CO2: 22 mmol/L (ref 18–29)
CREATININE: 0.8 mg/dL (ref 0.76–1.27)
Chloride: 101 mmol/L (ref 96–106)
GFR calc Af Amer: 104 mL/min/{1.73_m2} (ref >59)
GFR, EST NON AFRICAN AMERICAN: 90 mL/min/{1.73_m2} (ref >59)
GLOBULIN, TOTAL: 1.8 g/dL (ref 1.5–4.5)
GLUCOSE: 142 mg/dL — AB (ref 65–99)
Potassium: 5 mmol/L (ref 3.5–5.2)
Sodium: 141 mmol/L (ref 134–144)
Total Protein: 6.2 g/dL (ref 6.0–8.5)

## 2015-05-30 LAB — CBC WITH DIFFERENTIAL/PLATELET
BASOS ABS: 0 10*3/uL (ref 0.0–0.2)
BASOS: 0 %
EOS (ABSOLUTE): 0.3 10*3/uL (ref 0.0–0.4)
Eos: 4 %
HEMOGLOBIN: 13 g/dL (ref 12.6–17.7)
Hematocrit: 38.2 % (ref 37.5–51.0)
IMMATURE GRANS (ABS): 0 10*3/uL (ref 0.0–0.1)
IMMATURE GRANULOCYTES: 1 %
LYMPHS: 35 %
Lymphocytes Absolute: 2.7 10*3/uL (ref 0.7–3.1)
MCH: 32.3 pg (ref 26.6–33.0)
MCHC: 34 g/dL (ref 31.5–35.7)
MCV: 95 fL (ref 79–97)
MONOCYTES: 9 %
Monocytes Absolute: 0.7 10*3/uL (ref 0.1–0.9)
NEUTROS ABS: 4.1 10*3/uL (ref 1.4–7.0)
Neutrophils: 51 %
Platelets: 212 10*3/uL (ref 150–379)
RBC: 4.02 x10E6/uL — ABNORMAL LOW (ref 4.14–5.80)
RDW: 12.7 % (ref 12.3–15.4)
WBC: 7.9 10*3/uL (ref 3.4–10.8)

## 2015-05-30 LAB — LIPID PANEL W/O CHOL/HDL RATIO
CHOLESTEROL TOTAL: 101 mg/dL (ref 100–199)
HDL: 26 mg/dL — ABNORMAL LOW (ref >39)
LDL Calculated: 47 mg/dL (ref 0–99)
TRIGLYCERIDES: 142 mg/dL (ref 0–149)
VLDL Cholesterol Cal: 28 mg/dL (ref 5–40)

## 2015-05-30 LAB — PSA: PROSTATE SPECIFIC AG, SERUM: 0.7 ng/mL (ref 0.0–4.0)

## 2015-05-30 LAB — TSH: TSH: 3.16 u[IU]/mL (ref 0.450–4.500)

## 2015-05-30 LAB — T4, FREE: FREE T4: 0.85 ng/dL (ref 0.82–1.77)

## 2015-06-01 ENCOUNTER — Encounter: Payer: Self-pay | Admitting: Family Medicine

## 2015-06-04 DIAGNOSIS — H25011 Cortical age-related cataract, right eye: Secondary | ICD-10-CM | POA: Diagnosis not present

## 2015-06-04 DIAGNOSIS — H2511 Age-related nuclear cataract, right eye: Secondary | ICD-10-CM | POA: Diagnosis not present

## 2015-06-06 HISTORY — PX: CATARACT EXTRACTION, BILATERAL: SHX1313

## 2015-06-12 DIAGNOSIS — H2511 Age-related nuclear cataract, right eye: Secondary | ICD-10-CM | POA: Diagnosis not present

## 2015-07-09 ENCOUNTER — Other Ambulatory Visit: Payer: Self-pay | Admitting: Family Medicine

## 2015-07-10 NOTE — Telephone Encounter (Signed)
Please clarify if patient is taking this medicine; it was discontinued in med list earlier; thanks

## 2015-07-11 NOTE — Telephone Encounter (Signed)
Please call patient; see if he is taking this; my last note says it was discontinued; thank you

## 2015-07-13 ENCOUNTER — Telehealth: Payer: Self-pay | Admitting: Unknown Physician Specialty

## 2015-07-13 NOTE — Telephone Encounter (Signed)
rx was written for 90 with 1 refill in Dec. Pharmacy notified that he has a refill remaining.

## 2015-07-13 NOTE — Telephone Encounter (Signed)
Pt would like refill for januvia sent to [facility](only has 1 pill left). Original refill request was sent to Dr Sherie DonLada but the pt has chosen to [facility] and see C.Wicker.

## 2015-07-16 NOTE — Telephone Encounter (Signed)
Patient states was unaware that it was discontinued, so he was still taking, but has since ran out?

## 2015-07-16 NOTE — Telephone Encounter (Signed)
That's fine; it just wasn't in his med list; I sent new Rx; thank you

## 2015-08-13 ENCOUNTER — Telehealth: Payer: Self-pay

## 2015-08-13 MED ORDER — GABAPENTIN 600 MG PO TABS: ORAL_TABLET | ORAL | Status: DC

## 2015-08-13 NOTE — Telephone Encounter (Signed)
Tried to call patient because we got a refill request for him. He was seeing Dr. Sherie DonLada, but I noticed that in a previous encounter in his chart, it was stated that he was staying her to see Elnita MaxwellCheryl. According to chart, patient has follow-up appointment scheduled with [facility] at [facility]. I tried to call patient to see which office and provider he is going to see but the patient did not answer so I left him a voicemail asking for him to please return my call. Patient needs f/u scheduled with Elnita MaxwellCheryl if he is staying here at [facility].

## 2015-08-13 NOTE — Telephone Encounter (Signed)
Thanks, refilled Gabapentin

## 2015-08-13 NOTE — Telephone Encounter (Signed)
Patient returned call and stated he was staying here to see Elnita MaxwellCheryl so I schedule him a f/u with us for 09/10/15.

## 2015-09-10 ENCOUNTER — Ambulatory Visit: Payer: Self-pay | Admitting: Family Medicine

## 2015-09-10 ENCOUNTER — Encounter: Payer: Self-pay | Admitting: Unknown Physician Specialty

## 2015-09-10 ENCOUNTER — Ambulatory Visit (INDEPENDENT_AMBULATORY_CARE_PROVIDER_SITE_OTHER): Payer: Medicare Other | Admitting: Unknown Physician Specialty

## 2015-09-10 VITALS — BP 124/75 | HR 57 | Temp 97.7°F | Ht 66.3 in | Wt 142.6 lb

## 2015-09-10 DIAGNOSIS — E1142 Type 2 diabetes mellitus with diabetic polyneuropathy: Secondary | ICD-10-CM

## 2015-09-10 DIAGNOSIS — I10 Essential (primary) hypertension: Secondary | ICD-10-CM | POA: Diagnosis not present

## 2015-09-10 DIAGNOSIS — E785 Hyperlipidemia, unspecified: Secondary | ICD-10-CM

## 2015-09-10 DIAGNOSIS — E1122 Type 2 diabetes mellitus with diabetic chronic kidney disease: Secondary | ICD-10-CM | POA: Diagnosis not present

## 2015-09-10 DIAGNOSIS — N182 Chronic kidney disease, stage 2 (mild): Secondary | ICD-10-CM | POA: Diagnosis not present

## 2015-09-10 DIAGNOSIS — I129 Hypertensive chronic kidney disease with stage 1 through stage 4 chronic kidney disease, or unspecified chronic kidney disease: Secondary | ICD-10-CM

## 2015-09-10 LAB — MICROALBUMIN, URINE WAIVED
CREATININE, URINE WAIVED: 200 mg/dL (ref 10–300)
MICROALB, UR WAIVED: 80 mg/L — AB (ref 0–19)

## 2015-09-10 LAB — BAYER DCA HB A1C WAIVED: HB A1C: 8.6 % — AB (ref <7.0)

## 2015-09-10 MED ORDER — SITAGLIPTIN PHOSPHATE 100 MG PO TABS: 100.0000 mg | ORAL_TABLET | Freq: Every day | ORAL | Status: DC

## 2015-09-10 MED ORDER — GLIPIZIDE ER 5 MG PO TB24: 5.0000 mg | ORAL_TABLET | Freq: Every day | ORAL | Status: DC

## 2015-09-10 NOTE — Assessment & Plan Note (Signed)
Hgb A1C is 8.6.  He did not tolerate Metformin.  Will start Glucotrol 5 mg XR.

## 2015-09-10 NOTE — Progress Notes (Signed)
BP 124/75 mmHg  Pulse 57  Temp(Src) 97.7 F (36.5 C)  Ht 5' 6.3" (1.684 m)  Wt 142 lb 9.6 oz (64.683 kg)  BMI 22.81 kg/m2  SpO2 98%   Subjective:    Patient ID: Matthew RakeJerry C Wolf, male    DOB: 1946/03/16, 70 y.o.   MRN: [id number]  HPI: Matthew Wolf is a 70 y.o. male  Chief Complaint  Patient presents with  . Diabetes  . Hyperlipidemia  . Hypertension   Diabetes: Just taking Januvia.  Stopped Metformin due to diarrhea.   Using medications without difficulties No hypoglycemic episodes No hyperglycemic episodes Feet problems: Takes Gabapentin for his feet.  This is not helping and is in pain all the time.  Night time seems to be the worse.   Blood Sugars averaging: eye exam within last year Last Hgb A1C: 7.2  Hypertension  Using medications without difficulty Average home BPs runs wnl   Using medication without problems or lightheadedness No chest pain with exertion or shortness of breath No Edema  Elevated Cholesterol Using medications without problems No Muscle aches  Diet: good Exercise: None besides work.    Relevant past medical, surgical, family and social history reviewed and updated as indicated. Interim medical history since our last visit reviewed. Allergies and medications reviewed and updated.  Review of Systems  Per HPI unless specifically indicated above     Objective:    BP 124/75 mmHg  Pulse 57  Temp(Src) 97.7 F (36.5 C)  Ht 5' 6.3" (1.684 m)  Wt 142 lb 9.6 oz (64.683 kg)  BMI 22.81 kg/m2  SpO2 98%  Wt Readings from Last 3 Encounters:  09/10/15 142 lb 9.6 oz (64.683 kg)  03/12/15 137 lb (62.143 kg)  11/30/14 139 lb (63.05 kg)    Physical Exam  Results for orders placed or performed in visit on 05/29/15  Lipid Panel w/o Chol/HDL Ratio  Result Value Ref Range   Cholesterol, Total 101 100 - 199 mg/dL   Triglycerides 409142 0 - 149 mg/dL   HDL 26 (L) >81>39 mg/dL   VLDL Cholesterol Cal 28 5 - 40 mg/dL   LDL Calculated 47 0 - 99 mg/dL   T4, free  Result Value Ref Range   Free T4 0.85 0.82 - 1.77 ng/dL  CBC with Differential/Platelet  Result Value Ref Range   WBC 7.9 3.4 - 10.8 x10E3/uL   RBC 4.02 (L) 4.14 - 5.80 x10E6/uL   Hemoglobin 13.0 12.6 - 17.7 g/dL   Hematocrit 19.138.2 47.837.5 - 51.0 %   MCV 95 79 - 97 fL   MCH 32.3 26.6 - 33.0 pg   MCHC 34.0 31.5 - 35.7 g/dL   RDW 29.512.7 62.112.3 - 30.815.4 %   Platelets 212 150 - 379 x10E3/uL   Neutrophils 51 %   Lymphs 35 %   Monocytes 9 %   Eos 4 %   Basos 0 %   Neutrophils Absolute 4.1 1.4 - 7.0 x10E3/uL   Lymphocytes Absolute 2.7 0.7 - 3.1 x10E3/uL   Monocytes Absolute 0.7 0.1 - 0.9 x10E3/uL   EOS (ABSOLUTE) 0.3 0.0 - 0.4 x10E3/uL   Basophils Absolute 0.0 0.0 - 0.2 x10E3/uL   Immature Granulocytes 1 %   Immature Grans (Abs) 0.0 0.0 - 0.1 x10E3/uL  PSA  Result Value Ref Range   Prostate Specific Ag, Serum 0.7 0.0 - 4.0 ng/mL  TSH  Result Value Ref Range   TSH 3.160 0.450 - 4.500 uIU/mL  Comprehensive metabolic panel  Result  Value Ref Range   Glucose 142 (H) 65 - 99 mg/dL   BUN 14 8 - 27 mg/dL   Creatinine, Ser 1.61 0.76 - 1.27 mg/dL   GFR calc non Af Amer 90 >59 mL/min/1.73   GFR calc Af Amer 104 >59 mL/min/1.73   BUN/Creatinine Ratio 18 10 - 22   Sodium 141 134 - 144 mmol/L   Potassium 5.0 3.5 - 5.2 mmol/L   Chloride 101 96 - 106 mmol/L   CO2 22 18 - 29 mmol/L   Calcium 8.9 8.6 - 10.2 mg/dL   Total Protein 6.2 6.0 - 8.5 g/dL   Albumin 4.4 3.5 - 4.8 g/dL   Globulin, Total 1.8 1.5 - 4.5 g/dL   Albumin/Globulin Ratio 2.4 1.1 - 2.5   Bilirubin Total 0.5 0.0 - 1.2 mg/dL   Alkaline Phosphatase 104 39 - 117 IU/L   AST 14 0 - 40 IU/L   ALT 13 0 - 44 IU/L  Celiac Disease Ab Screen w/Rfx  Result Value Ref Range   Antigliadin Abs, IgA 2 0 - 19 units   Transglutaminase IgA <2 0 - 3 U/mL   IgA/Immunoglobulin A, Serum 239 61 - 437 mg/dL      Assessment & Plan:   Problem List Items Addressed This Visit      Unprioritized   Diabetic peripheral neuropathy (HCC)     This is a big problem for him.  Discussed supplements.  Consider Lyrica.        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  He did not tolerate Metformin.  Will start Glucotrol 5 mg XR.        Relevant Medications   glipiZIDE (GLUCOTROL XL) 5 MG 24 hr tablet   Other Relevant Orders   Bayer DCA Hb A1c Waived   Comprehensive metabolic panel       Follow up plan: Return in about 3 months (around 12/11/2015).

## 2015-09-10 NOTE — Assessment & Plan Note (Signed)
This is a big problem for him.  Discussed supplements.  Consider Lyrica.

## 2015-09-10 NOTE — Patient Instructions (Signed)
For foot pain: ALA - alpha lipoic Acid Evening primrose oil

## 2015-09-10 NOTE — Addendum Note (Signed)
Addended by: Gabriel CirriWICKER, Raymond Bhardwaj on: 09/10/2015 03:52 PM   Modules accepted: Orders

## 2015-09-11 DIAGNOSIS — H401131 Primary open-angle glaucoma, bilateral, mild stage: Secondary | ICD-10-CM | POA: Diagnosis not present

## 2015-09-11 DIAGNOSIS — H353131 Nonexudative age-related macular degeneration, bilateral, early dry stage: Secondary | ICD-10-CM | POA: Diagnosis not present

## 2015-09-11 LAB — COMPREHENSIVE METABOLIC PANEL
ALBUMIN: 4.2 g/dL (ref 3.5–4.8)
ALK PHOS: 95 IU/L (ref 39–117)
ALT: 15 IU/L (ref 0–44)
AST: 15 IU/L (ref 0–40)
Albumin/Globulin Ratio: 1.9 (ref 1.2–2.2)
BUN / CREAT RATIO: 21 (ref 10–24)
BUN: 23 mg/dL (ref 8–27)
Bilirubin Total: 0.5 mg/dL (ref 0.0–1.2)
CALCIUM: 8.6 mg/dL (ref 8.6–10.2)
CO2: 20 mmol/L (ref 18–29)
CREATININE: 1.07 mg/dL (ref 0.76–1.27)
Chloride: 93 mmol/L — ABNORMAL LOW (ref 96–106)
GFR calc Af Amer: 80 mL/min/{1.73_m2} (ref >59)
GFR, EST NON AFRICAN AMERICAN: 69 mL/min/{1.73_m2} (ref >59)
GLOBULIN, TOTAL: 2.2 g/dL (ref 1.5–4.5)
GLUCOSE: 144 mg/dL — AB (ref 65–99)
Potassium: 5.2 mmol/L (ref 3.5–5.2)
SODIUM: 130 mmol/L — AB (ref 134–144)
Total Protein: 6.4 g/dL (ref 6.0–8.5)

## 2015-09-11 LAB — HM DIABETES EYE EXAM

## 2015-12-25 ENCOUNTER — Encounter: Payer: Self-pay | Admitting: Unknown Physician Specialty

## 2015-12-25 ENCOUNTER — Ambulatory Visit (INDEPENDENT_AMBULATORY_CARE_PROVIDER_SITE_OTHER): Payer: Medicare Other | Admitting: Unknown Physician Specialty

## 2015-12-25 VITALS — BP 114/68 | HR 62 | Temp 97.5°F | Ht 65.7 in | Wt 145.0 lb

## 2015-12-25 DIAGNOSIS — Z23 Encounter for immunization: Secondary | ICD-10-CM

## 2015-12-25 DIAGNOSIS — E785 Hyperlipidemia, unspecified: Secondary | ICD-10-CM

## 2015-12-25 DIAGNOSIS — N182 Chronic kidney disease, stage 2 (mild): Secondary | ICD-10-CM | POA: Diagnosis not present

## 2015-12-25 DIAGNOSIS — I129 Hypertensive chronic kidney disease with stage 1 through stage 4 chronic kidney disease, or unspecified chronic kidney disease: Secondary | ICD-10-CM | POA: Diagnosis not present

## 2015-12-25 DIAGNOSIS — E1142 Type 2 diabetes mellitus with diabetic polyneuropathy: Secondary | ICD-10-CM | POA: Diagnosis not present

## 2015-12-25 DIAGNOSIS — I10 Essential (primary) hypertension: Secondary | ICD-10-CM

## 2015-12-25 DIAGNOSIS — E1122 Type 2 diabetes mellitus with diabetic chronic kidney disease: Secondary | ICD-10-CM

## 2015-12-25 LAB — BAYER DCA HB A1C WAIVED: HB A1C (BAYER DCA - WAIVED): 7 % — ABNORMAL HIGH (ref <7.0)

## 2015-12-25 NOTE — Assessment & Plan Note (Addendum)
Still symptomatic with gabapentin. Seeing a podiatrist for further management.

## 2015-12-25 NOTE — Assessment & Plan Note (Signed)
Stable, continue current medications.  

## 2015-12-25 NOTE — Progress Notes (Signed)
BP 114/68 (BP Location: Left Arm, Patient Position: Sitting, Cuff Size: Normal)   Pulse 62   Temp 97.5 F (36.4 C)   Ht 5' 5.7" (1.669 m) Comment: pt had shoes on  Wt 145 lb (65.8 kg) Comment: pt had shoes on  SpO2 95%   BMI 23.62 kg/m    Subjective:    Patient ID: Matthew Wolf, male    DOB: [birth year], 70 y.o.   MRN: [id number]  HPI: Matthew Wolf is a 70 y.o. male presents today for follow up regarding Diabetes and Hypertension.  Chief Complaint  Patient presents with  . Diabetes  . Hypertension   Diabetes Not checking blood sugar at home. Still having complaints of neuropathy in bilateral feet despite gabapentin use, seeing a podiatrist next week.  Denies symptoms of hypoglycemia, including feeling clammy, weak or shaky.  Doing well with his diet, reports eating fruits, vegetables and lean meats.  Doesn't have a designated exercise regimen, but remains active working 6 days a week.  Last hbg A1C 8.6, started on Glucotrol and continued Januvia, taking as instructed. hbg A1C today was 7.0.   Hypertension Checking at home, reports 110's/60's is typical for him.  Denies headaches, blurred vision. Also denies chest pain, palpitations, shortness of breath or swelling.    Relevant past medical, surgical, family and social history reviewed and updated as indicated. Interim medical history since our last visit reviewed. Allergies and medications reviewed and updated.  Review of Systems  Respiratory: Negative for shortness of breath.   Cardiovascular: Negative for chest pain, palpitations and leg swelling.  Neurological: Positive for numbness.    Per HPI unless specifically indicated above     Objective:    BP 114/68 (BP Location: Left Arm, Patient Position: Sitting, Cuff Size: Normal)   Pulse 62   Temp 97.5 F (36.4 C)   Ht 5' 5.7" (1.669 m) Comment: pt had shoes on  Wt 145 lb (65.8 kg) Comment: pt had shoes on  SpO2 95%   BMI 23.62 kg/m   Wt Readings from  Last 3 Encounters:  12/25/15 145 lb (65.8 kg)  09/10/15 142 lb 9.6 oz (64.7 kg)  03/12/15 137 lb (62.1 kg)    Physical Exam  Constitutional: He is oriented to person, place, and time. He appears well-developed and well-nourished. No distress.  Cardiovascular: Normal rate, regular rhythm, normal heart sounds and intact distal pulses.   Pulmonary/Chest: Effort normal and breath sounds normal.  Musculoskeletal: He exhibits no edema.       Right foot: There is no swelling.       Left foot: There is no swelling.       Feet:  Neurological: He is alert and oriented to person, place, and time.  Skin:     Psychiatric: He has a normal mood and affect. His behavior is normal. Judgment and thought content normal.    Results for orders placed or performed in visit on 09/12/15  HM DIABETES EYE EXAM  Result Value Ref Range   HM Diabetic Eye Exam No Retinopathy No Retinopathy      Assessment & Plan:   Problem List Items Addressed This Visit      Cardiovascular and Mediastinum   Essential hypertension, benign    Stable, continue current medications.       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 Continue current medications.       Relevant Orders  Comprehensive metabolic panel   Bayer DCA Hb W0J Waived     Endocrine   Diabetic peripheral neuropathy (HCC)    Still symptomatic with gabapentin. Seeing a podiatrist for further management.         Other   Hyperlipidemia    Continue Lipitor.       Other Visit Diagnoses    Need for influenza vaccination    -  Primary   Relevant Orders   Flu vaccine HIGH DOSE PF (Completed)       Follow up plan: Follow up for physical in 2 months.

## 2015-12-25 NOTE — Progress Notes (Deleted)
   BP 114/68 (BP Location: Left Arm, Patient Position: Sitting, Cuff Size: Normal)   Pulse 62   Temp 97.5 F (36.4 C)   Ht 5' 5.7" (1.669 m) Comment: pt had shoes on  Wt 145 lb (65.8 kg) Comment: pt had shoes on  SpO2 95%   BMI 23.62 kg/m    Subjective:    Patient ID: Keith RakeJerry C Zynda, male    DOB: [birth year], 70 y.o.   MRN: [id number]  HPI: Keith RakeJerry C Mesina is a 70 y.o. male  Chief Complaint  Patient presents with  . Diabetes  . Hypertension    Relevant past medical, surgical, family and social history reviewed and updated as indicated. Interim medical history since our last visit reviewed. Allergies and medications reviewed and updated.  Review of Systems  Per HPI unless specifically indicated above     Objective:    BP 114/68 (BP Location: Left Arm, Patient Position: Sitting, Cuff Size: Normal)   Pulse 62   Temp 97.5 F (36.4 C)   Ht 5' 5.7" (1.669 m) Comment: pt had shoes on  Wt 145 lb (65.8 kg) Comment: pt had shoes on  SpO2 95%   BMI 23.62 kg/m   Wt Readings from Last 3 Encounters:  12/25/15 145 lb (65.8 kg)  09/10/15 142 lb 9.6 oz (64.7 kg)  03/12/15 137 lb (62.1 kg)    Physical Exam  Results for orders placed or performed in visit on 09/12/15  HM DIABETES EYE EXAM  Result Value Ref Range   HM Diabetic Eye Exam No Retinopathy No Retinopathy      Assessment & Plan:   Problem List Items Addressed This Visit    None    Visit Diagnoses    Need for influenza vaccination    -  Primary   Relevant Orders   Flu vaccine HIGH DOSE PF (Completed)       Follow up plan: No Follow-up on file.

## 2015-12-25 NOTE — Patient Instructions (Addendum)

## 2015-12-25 NOTE — Assessment & Plan Note (Signed)
-  Continue Lipitor °

## 2015-12-25 NOTE — Assessment & Plan Note (Signed)
hbg A1C today was 7.0. Continue current medications.

## 2015-12-26 ENCOUNTER — Encounter: Payer: Self-pay | Admitting: Unknown Physician Specialty

## 2015-12-26 LAB — COMPREHENSIVE METABOLIC PANEL
ALK PHOS: 115 IU/L (ref 39–117)
ALT: 16 IU/L (ref 0–44)
AST: 13 IU/L (ref 0–40)
Albumin/Globulin Ratio: 2.2 (ref 1.2–2.2)
Albumin: 4.3 g/dL (ref 3.5–4.8)
BILIRUBIN TOTAL: 0.8 mg/dL (ref 0.0–1.2)
BUN/Creatinine Ratio: 16 (ref 10–24)
BUN: 14 mg/dL (ref 8–27)
CHLORIDE: 96 mmol/L (ref 96–106)
CO2: 23 mmol/L (ref 18–29)
CREATININE: 0.87 mg/dL (ref 0.76–1.27)
Calcium: 8.7 mg/dL (ref 8.6–10.2)
GFR calc Af Amer: 100 mL/min/{1.73_m2} (ref >59)
GFR calc non Af Amer: 86 mL/min/{1.73_m2} (ref >59)
GLOBULIN, TOTAL: 2 g/dL (ref 1.5–4.5)
GLUCOSE: 72 mg/dL (ref 65–99)
Potassium: 4.1 mmol/L (ref 3.5–5.2)
SODIUM: 135 mmol/L (ref 134–144)
Total Protein: 6.3 g/dL (ref 6.0–8.5)

## 2016-01-01 ENCOUNTER — Encounter: Payer: Self-pay | Admitting: Podiatry

## 2016-01-01 ENCOUNTER — Ambulatory Visit (INDEPENDENT_AMBULATORY_CARE_PROVIDER_SITE_OTHER): Payer: Medicare Other | Admitting: Podiatry

## 2016-01-01 VITALS — BP 137/68 | HR 58 | Ht 67.0 in | Wt 140.0 lb

## 2016-01-01 DIAGNOSIS — M79671 Pain in right foot: Secondary | ICD-10-CM | POA: Diagnosis not present

## 2016-01-01 DIAGNOSIS — E1143 Type 2 diabetes mellitus with diabetic autonomic (poly)neuropathy: Secondary | ICD-10-CM | POA: Diagnosis not present

## 2016-01-01 DIAGNOSIS — M79672 Pain in left foot: Secondary | ICD-10-CM | POA: Diagnosis not present

## 2016-01-03 ENCOUNTER — Telehealth: Payer: Self-pay | Admitting: *Deleted

## 2016-01-03 NOTE — Telephone Encounter (Addendum)
Pt states his gabapentin was not at the pharmacy, and the cream was to come from [facility]. 01/04/2016-I left message informing pt the medication discussed yesterday were ordered by Dr. Logan BoresEvans.

## 2016-01-04 ENCOUNTER — Other Ambulatory Visit: Payer: Self-pay | Admitting: Podiatry

## 2016-01-04 DIAGNOSIS — E1143 Type 2 diabetes mellitus with diabetic autonomic (poly)neuropathy: Secondary | ICD-10-CM

## 2016-01-04 MED ORDER — GABAPENTIN 600 MG PO TABS: ORAL_TABLET | ORAL | 3 refills | Status: DC

## 2016-01-04 MED ORDER — NONFORMULARY OR COMPOUNDED ITEM: 1.0000 g | Freq: Four times a day (QID) | 2 refills | Status: DC

## 2016-01-04 MED ORDER — L-METHYLFOLATE-B6-B12 3-35-2 MG PO TABS: 1.0000 | ORAL_TABLET | Freq: Every day | ORAL | 2 refills | Status: AC

## 2016-01-04 NOTE — Telephone Encounter (Signed)
We refaxed a [facility] this morning, and I reordered his Gabapentin this AM as well. It should be at his pharmacy.  Thanks!

## 2016-01-04 NOTE — Progress Notes (Signed)
Order placed for Metanx and Gabapentin based on office visit 01/01/16.  Felecia ShellingBrent M. Brodie Scovell

## 2016-01-05 NOTE — Progress Notes (Signed)
Subjective:  Patient presents today for pain and tenderness to the toes bilaterally. Patient states that he's had the pain since February 2015. Patient is concerned that his neuropathy is getting worse in both of his feet. Patient presents today for further treatment and evaluation    Objective: Physical Exam General: The patient is alert and oriented x3 in no acute distress.  Dermatology: Skin is warm, dry and supple bilateral lower extremities. Negative for open lesions or macerations.  Vascular: Palpable pedal pulses bilaterally. No edema or erythema noted. Capillary refill within normal limits.  Neurological: Epicritic and protective threshold grossly intact bilaterally.   Musculoskeletal Exam: Range of motion within normal limits to all pedal and ankle joints bilateral. Muscle strength 5/5 in all groups bilateral.   Radiographic Exam:    Normal osseous mineralization. Joint spaces preserved. No fracture/dislocation/boney destruction.     Assessment: #1 diabetes mellitus with manifestations of peripheral neuropathy bilateral lower extremities. #2 diabetes mellitus #3 pain in bilateral feet   Plan of Care:  #1 Patient was evaluated. #2 recommend trying Lyrica versus gabapentin medication therapy. Also recommend custom orthotics. #3 today prescription for metanx was since the pharmacy as well as a prescription for peripheral neuropathy pain cream dispensed through [facility]. #4 patient is to return to clinic in 3 months     Dr. Felecia ShellingBrent M. Evans, DPM [facility]

## 2016-01-05 NOTE — Patient Instructions (Signed)
Diabetes and Foot Care Diabetes may cause you to have problems because of poor blood supply (circulation) to your feet and legs. This may cause the skin on your feet to become thinner, break easier, and heal more slowly. Your skin may become dry, and the skin may peel and crack. You may also have nerve damage in your legs and feet causing decreased feeling in them. You may not notice minor injuries to your feet that could lead to infections or more serious problems. Taking care of your feet is one of the most important things you can do for yourself.  HOME CARE INSTRUCTIONS  Wear shoes at all times, even in the house. Do not go barefoot. Bare feet are easily injured.  Check your feet daily for blisters, cuts, and redness. If you cannot see the bottom of your feet, use a mirror or ask someone for help.  Wash your feet with warm water (do not use hot water) and mild soap. Then pat your feet and the areas between your toes until they are completely dry. Do not soak your feet as this can dry your skin.  Apply a moisturizing lotion or petroleum jelly (that does not contain alcohol and is unscented) to the skin on your feet and to dry, brittle toenails. Do not apply lotion between your toes.  Trim your toenails straight across. Do not dig under them or around the cuticle. File the edges of your nails with an emery board or nail file.  Do not cut corns or calluses or try to remove them with medicine.  Wear clean socks or stockings every day. Make sure they are not too tight. Do not wear knee-high stockings since they may decrease blood flow to your legs.  Wear shoes that fit properly and have enough cushioning. To break in new shoes, wear them for just a few hours a day. This prevents you from injuring your feet. Always look in your shoes before you put them on to be sure there are no objects inside.  Do not cross your legs. This may decrease the blood flow to your feet.  If you find a minor scrape,  cut, or break in the skin on your feet, keep it and the skin around it clean and dry. These areas may be cleansed with mild soap and water. Do not cleanse the area with peroxide, alcohol, or iodine.  When you remove an adhesive bandage, be sure not to damage the skin around it.  If you have a wound, look at it several times a day to make sure it is healing.  Do not use heating pads or hot water bottles. They may burn your skin. If you have lost feeling in your feet or legs, you may not know it is happening until it is too late.  Make sure your health care provider performs a complete foot exam at least annually or more often if you have foot problems. Report any cuts, sores, or bruises to your health care provider immediately. SEEK MEDICAL CARE IF:   You have an injury that is not healing.  You have cuts or breaks in the skin.  You have an ingrown nail.  You notice redness on your legs or feet.  You feel burning or tingling in your legs or feet.  You have pain or cramps in your legs and feet.  Your legs or feet are numb.  Your feet always feel cold. SEEK IMMEDIATE MEDICAL CARE IF:   There is increasing redness,   swelling, or pain in or around a wound.  There is a red line that goes up your leg.  Pus is coming from a wound.  You develop a fever or as directed by your health care provider.  You notice a bad smell coming from an ulcer or wound.   This information is not intended to replace advice given to you by your health care provider. Make sure you discuss any questions you have with your health care provider.   Document Released: 03/21/2000 Document Revised: 11/24/2012 Document Reviewed: 08/31/2012 Elsevier Interactive Patient Education 2016 [employer]  

## 2016-01-07 ENCOUNTER — Telehealth: Payer: Self-pay | Admitting: *Deleted

## 2016-01-07 NOTE — Telephone Encounter (Signed)
Dr. Logan BoresEvans ordered [facility] Peripheral neuropathy cream. Faxed.

## 2016-01-23 DIAGNOSIS — H401131 Primary open-angle glaucoma, bilateral, mild stage: Secondary | ICD-10-CM | POA: Diagnosis not present

## 2016-01-23 DIAGNOSIS — H353131 Nonexudative age-related macular degeneration, bilateral, early dry stage: Secondary | ICD-10-CM | POA: Diagnosis not present

## 2016-02-13 ENCOUNTER — Encounter (INDEPENDENT_AMBULATORY_CARE_PROVIDER_SITE_OTHER): Payer: Self-pay

## 2016-02-25 ENCOUNTER — Encounter: Payer: Self-pay | Admitting: Unknown Physician Specialty

## 2016-02-25 ENCOUNTER — Ambulatory Visit (INDEPENDENT_AMBULATORY_CARE_PROVIDER_SITE_OTHER): Payer: Medicare Other | Admitting: Unknown Physician Specialty

## 2016-02-25 VITALS — BP 120/71 | HR 62 | Temp 97.5°F | Ht 66.8 in | Wt 148.0 lb

## 2016-02-25 DIAGNOSIS — Z0000 Encounter for general adult medical examination without abnormal findings: Secondary | ICD-10-CM | POA: Diagnosis not present

## 2016-02-25 NOTE — Progress Notes (Signed)
BP 120/71 (BP Location: Left Arm, Patient Position: Sitting, Cuff Size: Normal)   Pulse 62   Temp 97.5 F (36.4 C)   Ht 5' 6.8" (1.697 m)   Wt 148 lb (67.1 kg)   SpO2 96%   BMI 23.32 kg/m    Subjective:    Patient ID: Matthew RakeJerry C Strack, male    DOB: 24-Mar-1946, 70 y.o.   MRN: [id number]  HPI: Matthew Wolf is a 70 y.o. male  Chief Complaint  Patient presents with  . Medicare Wellness   Functional Status Survey: Is the patient deaf or have difficulty hearing?: No Does the patient have difficulty seeing, even when wearing glasses/contacts?: No Does the patient have difficulty concentrating, remembering, or making decisions?: No Does the patient have difficulty walking or climbing stairs?: No Does the patient have difficulty dressing or bathing?: No Does the patient have difficulty doing errands alone such as visiting a doctor's office or shopping?: No Fall Risk  02/25/2016 03/12/2015  Falls in the past year? No No   [facility] 02/25/2016 03/12/2015  Decreased Interest 0 0  Down, Depressed, Hopeless 0 0  PHQ - 2 Score 0 0    Social History   Social History  . Marital status: Married    Spouse name: N/A  . Number of children: N/A  . Years of education: N/A   Occupational History  . Not on file.   Social History Main Topics  . Smoking status: Former Smoker    Types: Cigarettes    Quit date: 04/07/1984  . Smokeless tobacco: Never Used  . Alcohol use No  . Drug use: No  . Sexual activity: Yes   Other Topics Concern  . Not on file   Social History Narrative  . No narrative on file   Family History  Problem Relation Age of Onset  . Diabetes Mother   . Heart disease Mother   . Hypertension Mother   . Emphysema Father   . Cancer Father     lung  . Cancer Sister     breast  . Heart disease Maternal Aunt   . Stroke Neg Hx    Past Medical History:  Diagnosis Date  . BPH (benign prostatic hypertrophy)   . CKD (chronic kidney disease) stage 3,  GFR 30-59 ml/min   . Diabetes (HCC)   . Diabetic peripheral neuropathy (HCC)   . HBP (high blood pressure)   . Heart trouble   . Hyperlipidemia   . Hypertensive kidney disease   . MI (myocardial infarction) 1991, 1996   Past Surgical History:  Procedure Laterality Date  . ANGIOPLASTY     x 2  . CARDIAC SURGERY    . CATARACT EXTRACTION, BILATERAL  06/2015  . CORONARY ARTERY BYPASS GRAFT  1991  . HERNIA REPAIR      Relevant past medical, surgical, family and social history reviewed and updated as indicated. Interim medical history since our last visit reviewed. Allergies and medications reviewed and updated.  Review of Systems  All other systems reviewed and are negative.   Per HPI unless specifically indicated above     Objective:    BP 120/71 (BP Location: Left Arm, Patient Position: Sitting, Cuff Size: Normal)   Pulse 62   Temp 97.5 F (36.4 C)   Ht 5' 6.8" (1.697 m)   Wt 148 lb (67.1 kg)   SpO2 96%   BMI 23.32 kg/m   Wt Readings from Last 3 Encounters:  02/25/16 148  lb (67.1 kg)  01/01/16 140 lb (63.5 kg)  12/25/15 145 lb (65.8 kg)    Physical Exam  Constitutional: He is oriented to person, place, and time. He appears well-developed and well-nourished.  HENT:  Head: Normocephalic.  Right Ear: Tympanic membrane, external ear and ear canal normal.  Left Ear: Tympanic membrane, external ear and ear canal normal.  Mouth/Throat: Uvula is midline, oropharynx is clear and moist and mucous membranes are normal.  Eyes: Pupils are equal, round, and reactive to light.  Cardiovascular: Normal rate, regular rhythm and normal heart sounds.  Exam reveals no gallop and no friction rub.   No murmur heard. Pulmonary/Chest: Effort normal and breath sounds normal. No respiratory distress.  Abdominal: Soft. Bowel sounds are normal. He exhibits no distension. There is no tenderness.  Musculoskeletal: Normal range of motion.  Neurological: He is alert and oriented to person,  place, and time. He has normal reflexes.  Skin: Skin is warm and dry.  Psychiatric: He has a normal mood and affect. His behavior is normal. Judgment and thought content normal.    Results for orders placed or performed in visit on 12/25/15  Comprehensive metabolic panel  Result Value Ref Range   Glucose 72 65 - 99 mg/dL   BUN 14 8 - 27 mg/dL   Creatinine, Ser 7.820.87 0.76 - 1.27 mg/dL   GFR calc non Af Amer 86 >59 mL/min/1.73   GFR calc Af Amer 100 >59 mL/min/1.73   BUN/Creatinine Ratio 16 10 - 24   Sodium 135 134 - 144 mmol/L   Potassium 4.1 3.5 - 5.2 mmol/L   Chloride 96 96 - 106 mmol/L   CO2 23 18 - 29 mmol/L   Calcium 8.7 8.6 - 10.2 mg/dL   Total Protein 6.3 6.0 - 8.5 g/dL   Albumin 4.3 3.5 - 4.8 g/dL   Globulin, Total 2.0 1.5 - 4.5 g/dL   Albumin/Globulin Ratio 2.2 1.2 - 2.2   Bilirubin Total 0.8 0.0 - 1.2 mg/dL   Alkaline Phosphatase 115 39 - 117 IU/L   AST 13 0 - 40 IU/L   ALT 16 0 - 44 IU/L  Bayer DCA Hb A1c Waived  Result Value Ref Range   Bayer DCA Hb A1c Waived 7.0 (H) <7.0 %  -------    Assessment & Plan:   Problem List Items Addressed This Visit    None    Visit Diagnoses    Annual physical exam    -  Primary       Follow up plan: Return in about 1 month (around 03/26/2016) for diabetes check.

## 2016-03-11 DIAGNOSIS — I25810 Atherosclerosis of coronary artery bypass graft(s) without angina pectoris: Secondary | ICD-10-CM | POA: Diagnosis not present

## 2016-03-11 DIAGNOSIS — E782 Mixed hyperlipidemia: Secondary | ICD-10-CM | POA: Diagnosis not present

## 2016-03-11 DIAGNOSIS — I10 Essential (primary) hypertension: Secondary | ICD-10-CM | POA: Diagnosis not present

## 2016-03-12 DIAGNOSIS — H40013 Open angle with borderline findings, low risk, bilateral: Secondary | ICD-10-CM | POA: Diagnosis not present

## 2016-03-12 DIAGNOSIS — H353121 Nonexudative age-related macular degeneration, left eye, early dry stage: Secondary | ICD-10-CM | POA: Diagnosis not present

## 2016-03-14 ENCOUNTER — Other Ambulatory Visit: Payer: Self-pay | Admitting: Unknown Physician Specialty

## 2016-03-19 ENCOUNTER — Ambulatory Visit (INDEPENDENT_AMBULATORY_CARE_PROVIDER_SITE_OTHER): Payer: Medicare Other | Admitting: Unknown Physician Specialty

## 2016-03-19 ENCOUNTER — Encounter: Payer: Self-pay | Admitting: Unknown Physician Specialty

## 2016-03-19 VITALS — BP 111/70 | HR 65 | Temp 97.8°F | Wt 150.6 lb

## 2016-03-19 DIAGNOSIS — H6122 Impacted cerumen, left ear: Secondary | ICD-10-CM | POA: Diagnosis not present

## 2016-03-19 NOTE — Progress Notes (Signed)
BP 111/70 (BP Location: Left Arm, Patient Position: Sitting, Cuff Size: Normal)   Pulse 65   Temp 97.8 F (36.6 C)   Wt 150 lb 9.6 oz (68.3 kg)   SpO2 97%   BMI 23.73 kg/m    Subjective:    Patient ID: Matthew Wolf, male    DOB: 12-01-1945, 70 y.o.   MRN: [id number]  HPI: Matthew Wolf is a 70 y.o. male  Chief Complaint  Patient presents with  . Ear Fullness    pt states his right ear feels full and he states he knows something is wrong    Ear States his right ear is bothering with fullness but no pain. Can;t really explain the issue. He does think he hears better out of the left ear.   Relevant past medical, surgical, family and social history reviewed and updated as indicated. Interim medical history since our last visit reviewed. Allergies and medications reviewed and updated.  Review of Systems  Per HPI unless specifically indicated above     Objective:    BP 111/70 (BP Location: Left Arm, Patient Position: Sitting, Cuff Size: Normal)   Pulse 65   Temp 97.8 F (36.6 C)   Wt 150 lb 9.6 oz (68.3 kg)   SpO2 97%   BMI 23.73 kg/m   Wt Readings from Last 3 Encounters:  03/19/16 150 lb 9.6 oz (68.3 kg)  02/25/16 148 lb (67.1 kg)  01/01/16 140 lb (63.5 kg)    Physical Exam  Constitutional: He is oriented to person, place, and time. He appears well-developed and well-nourished. No distress.  HENT:  Head: Normocephalic and atraumatic.  Cerumen left eared cleared with irrigations  Eyes: Conjunctivae and lids are normal. Right eye exhibits no discharge. Left eye exhibits no discharge. No scleral icterus.  Cardiovascular: Normal rate.   Pulmonary/Chest: Effort normal.  Abdominal: Normal appearance. There is no splenomegaly or hepatomegaly.  Musculoskeletal: Normal range of motion.  Neurological: He is alert and oriented to person, place, and time.  Skin: Skin is intact. No rash noted. No pallor.  Psychiatric: He has a normal mood and affect. His behavior is  normal. Judgment and thought content normal.    Results for orders placed or performed in visit on 12/25/15  Comprehensive metabolic panel  Result Value Ref Range   Glucose 72 65 - 99 mg/dL   BUN 14 8 - 27 mg/dL   Creatinine, Ser 6.210.87 0.76 - 1.27 mg/dL   GFR calc non Af Amer 86 >59 mL/min/1.73   GFR calc Af Amer 100 >59 mL/min/1.73   BUN/Creatinine Ratio 16 10 - 24   Sodium 135 134 - 144 mmol/L   Potassium 4.1 3.5 - 5.2 mmol/L   Chloride 96 96 - 106 mmol/L   CO2 23 18 - 29 mmol/L   Calcium 8.7 8.6 - 10.2 mg/dL   Total Protein 6.3 6.0 - 8.5 g/dL   Albumin 4.3 3.5 - 4.8 g/dL   Globulin, Total 2.0 1.5 - 4.5 g/dL   Albumin/Globulin Ratio 2.2 1.2 - 2.2   Bilirubin Total 0.8 0.0 - 1.2 mg/dL   Alkaline Phosphatase 115 39 - 117 IU/L   AST 13 0 - 40 IU/L   ALT 16 0 - 44 IU/L  Bayer DCA Hb A1c Waived  Result Value Ref Range   Bayer DCA Hb A1c Waived 7.0 (H) <7.0 %      Assessment & Plan:   Problem List Items Addressed This Visit    None  Follow up plan: No Follow-up on file.

## 2016-05-13 ENCOUNTER — Other Ambulatory Visit: Payer: Self-pay | Admitting: Podiatry

## 2016-05-13 NOTE — Telephone Encounter (Signed)
Pt's pharmacy request refill of Gabapentin. Dr. Logan BoresEvans 01/01/2016 note states he recommended Lyrica instead of Gabapentin, but no Lyrica rx is in orders.

## 2016-05-18 NOTE — Telephone Encounter (Signed)
If patient does not feel improvement with Gabapentin we can change Rx to Lyrica. If patient feels like Gabapentin is working, then refill Gabapentin.  Dr. Logan BoresEvans

## 2016-05-19 NOTE — Telephone Encounter (Signed)
Pt needs an appt for follow up. 

## 2016-06-20 ENCOUNTER — Telehealth: Payer: Self-pay | Admitting: *Deleted

## 2016-06-20 MED ORDER — GABAPENTIN 600 MG PO TABS: ORAL_TABLET | ORAL | 0 refills | Status: DC

## 2016-06-20 NOTE — Telephone Encounter (Signed)
Received refill request from [id number] for Gabapentin 600mg  #105. Dr. Philomena DohenyEvans okayed refill pt must have an appt prior to future refills.

## 2016-07-29 ENCOUNTER — Telehealth: Payer: Self-pay | Admitting: *Deleted

## 2016-07-29 MED ORDER — GABAPENTIN 600 MG PO TABS: ORAL_TABLET | ORAL | 0 refills | Status: DC

## 2016-08-19 ENCOUNTER — Other Ambulatory Visit: Payer: Self-pay | Admitting: Unknown Physician Specialty

## 2016-09-09 DIAGNOSIS — H353121 Nonexudative age-related macular degeneration, left eye, early dry stage: Secondary | ICD-10-CM | POA: Diagnosis not present

## 2016-09-09 DIAGNOSIS — H401131 Primary open-angle glaucoma, bilateral, mild stage: Secondary | ICD-10-CM | POA: Diagnosis not present

## 2016-09-09 LAB — HM DIABETES EYE EXAM

## 2016-09-23 NOTE — Telephone Encounter (Signed)
Received refill request for gabapentin 600mg . Dr. Philomena DohenyEvans okayed for #50, pt needs an appt.09/23/2016-Received refill request for Gabapentin 600mg . Return fax okayed for #50 with note to make an appt. Left message informing pt his gabapentin had been filled and he needed to make an appt.

## 2016-10-07 ENCOUNTER — Encounter: Payer: Self-pay | Admitting: Podiatry

## 2016-10-07 ENCOUNTER — Ambulatory Visit (INDEPENDENT_AMBULATORY_CARE_PROVIDER_SITE_OTHER): Payer: Medicare HMO | Admitting: Podiatry

## 2016-10-07 DIAGNOSIS — E0842 Diabetes mellitus due to underlying condition with diabetic polyneuropathy: Secondary | ICD-10-CM

## 2016-10-10 NOTE — Progress Notes (Signed)
Subjective:  Patient presents today for a diabetic foot exam. He reports h/o neuropathy and reports constant numbness, tingling and burning sensation to bilateral feet that is worse at night. He has been taking Gabapentin with no significant relief. He is here for further evaluation and treatment.     Objective: Physical Exam General: The patient is alert and oriented x3 in no acute distress.  Dermatology: Skin is warm, dry and supple bilateral lower extremities. Negative for open lesions or macerations.  Vascular: Palpable pedal pulses bilaterally. No edema or erythema noted. Capillary refill within normal limits.  Neurological: Epicritic and protective threshold diminished bilaterally.   Musculoskeletal Exam: Range of motion within normal limits to all pedal and ankle joints bilateral. Muscle strength 5/5 in all groups bilateral.    Assessment: #1 diabetes mellitus with manifestations of peripheral neuropathy bilateral lower extremities. #2 diabetes mellitus #3 pain in bilateral feet   Plan of Care:  #1 Patient was evaluated. #2 Continue taking Gabapentin, Neuropathy cream from [facility] and [facility]. #3 Recommended good shoe gear. #4 Authorization for DM shoes. Patient molded today. #5 Return to clinic when necessary.     Dr. Felecia ShellingBrent M. Jetta Murray, DPM [facility]

## 2016-10-17 ENCOUNTER — Telehealth: Payer: Self-pay

## 2016-10-17 NOTE — Telephone Encounter (Signed)
Thank you :)

## 2016-10-17 NOTE — Telephone Encounter (Signed)
Received a form through the fax for patient's diabetic footwear. Per the form, documentation is needed within the last 6 months for patient's diabetes management. Patient has not been seen here for a diabetes check since 12/2015. Tried calling patient to schedule an appointment but he did not answer so I left him a VM asking for him to please return my call.

## 2016-10-17 NOTE — Telephone Encounter (Signed)
Patient returned Brittany's call.  Scheduled appt for Monday afternoon 7/16 @ 3:30

## 2016-10-20 ENCOUNTER — Other Ambulatory Visit: Payer: Self-pay | Admitting: Unknown Physician Specialty

## 2016-10-20 ENCOUNTER — Encounter: Payer: Self-pay | Admitting: Unknown Physician Specialty

## 2016-10-20 ENCOUNTER — Ambulatory Visit (INDEPENDENT_AMBULATORY_CARE_PROVIDER_SITE_OTHER): Payer: Medicare HMO | Admitting: Unknown Physician Specialty

## 2016-10-20 VITALS — BP 136/73 | HR 61 | Temp 98.0°F | Ht 67.0 in | Wt 146.2 lb

## 2016-10-20 DIAGNOSIS — N182 Chronic kidney disease, stage 2 (mild): Secondary | ICD-10-CM | POA: Diagnosis not present

## 2016-10-20 DIAGNOSIS — I10 Essential (primary) hypertension: Secondary | ICD-10-CM | POA: Diagnosis not present

## 2016-10-20 DIAGNOSIS — E1142 Type 2 diabetes mellitus with diabetic polyneuropathy: Secondary | ICD-10-CM | POA: Diagnosis not present

## 2016-10-20 DIAGNOSIS — E1122 Type 2 diabetes mellitus with diabetic chronic kidney disease: Secondary | ICD-10-CM

## 2016-10-20 DIAGNOSIS — I129 Hypertensive chronic kidney disease with stage 1 through stage 4 chronic kidney disease, or unspecified chronic kidney disease: Secondary | ICD-10-CM | POA: Diagnosis not present

## 2016-10-20 NOTE — Assessment & Plan Note (Signed)
Stable, continue present medications.   

## 2016-10-20 NOTE — Progress Notes (Signed)
BP 136/73   Pulse 61   Temp 98 F (36.7 C)   Ht 5\' 7"  (1.702 m)   Wt 146 lb 3.2 oz (66.3 kg)   SpO2 97%   BMI 22.90 kg/m    Subjective:    Patient ID: Matthew Wolf, male    DOB: 06-Jan-1946, 71 y.o.   MRN: [id number]  HPI: Matthew Wolf is a 71 y.o. male  Chief Complaint  Patient presents with  . Diabetes    paperwork to be filled out for diabetic shoes, states last eye exam was this year- will fax form to Dr. Leonides Cave office.  . Hypertension   Pt went to the podiatrist for his Neuropathy.  Diabetic shoes were recommended.  Lost to f/u with me  Diabetes: Using medications without difficulties No hypoglycemic episodes No hyperglycemic episodes Feet problems: Nephropathy Blood Sugars averaging:Not checking eye exam within last year Last Hgb A1C: 7.0  Hypertension  Using medications without difficulty Average home BPs  115-125 SBP  Using medication without problems or lightheadedness No chest pain with exertion or shortness of breath No Edema  Elevated Cholesterol Using medications without problems No Muscle aches       Relevant past medical, surgical, family and social history reviewed and updated as indicated. Interim medical history since our last visit reviewed. Allergies and medications reviewed and updated.  Review of Systems  Per HPI unless specifically indicated above     Objective:    BP 136/73   Pulse 61   Temp 98 F (36.7 C)   Ht 5\' 7"  (1.702 m)   Wt 146 lb 3.2 oz (66.3 kg)   SpO2 97%   BMI 22.90 kg/m   Wt Readings from Last 3 Encounters:  10/20/16 146 lb 3.2 oz (66.3 kg)  03/19/16 150 lb 9.6 oz (68.3 kg)  02/25/16 148 lb (67.1 kg)    Physical Exam  Constitutional: He is oriented to person, place, and time. He appears well-developed and well-nourished. No distress.  HENT:  Head: Normocephalic and atraumatic.  Eyes: Conjunctivae and lids are normal. Right eye exhibits no discharge. Left eye exhibits no discharge. No scleral  icterus.  Neck: Normal range of motion. Neck supple. No JVD present. Carotid bruit is not present.  Cardiovascular: Normal rate, regular rhythm and normal heart sounds.   Pulmonary/Chest: Effort normal and breath sounds normal. No respiratory distress.  Abdominal: Normal appearance. There is no splenomegaly or hepatomegaly.  Musculoskeletal: Normal range of motion.  Neurological: He is alert and oriented to person, place, and time.  Skin: Skin is warm, dry and intact. No rash noted. No pallor.  Psychiatric: He has a normal mood and affect. His behavior is normal. Judgment and thought content normal.    Results for orders placed or performed in visit on 12/25/15  Comprehensive metabolic panel  Result Value Ref Range   Glucose 72 65 - 99 mg/dL   BUN 14 8 - 27 mg/dL   Creatinine, Ser 4.09 0.76 - 1.27 mg/dL   GFR calc non Af Amer 86 >59 mL/min/1.73   GFR calc Af Amer 100 >59 mL/min/1.73   BUN/Creatinine Ratio 16 10 - 24   Sodium 135 134 - 144 mmol/L   Potassium 4.1 3.5 - 5.2 mmol/L   Chloride 96 96 - 106 mmol/L   CO2 23 18 - 29 mmol/L   Calcium 8.7 8.6 - 10.2 mg/dL   Total Protein 6.3 6.0 - 8.5 g/dL   Albumin 4.3 3.5 - 4.8 g/dL  Globulin, Total 2.0 1.5 - 4.5 g/dL   Albumin/Globulin Ratio 2.2 1.2 - 2.2   Bilirubin Total 0.8 0.0 - 1.2 mg/dL   Alkaline Phosphatase 115 39 - 117 IU/L   AST 13 0 - 40 IU/L   ALT 16 0 - 44 IU/L  Bayer DCA Hb A1c Waived  Result Value Ref Range   Bayer DCA Hb A1c Waived 7.0 (H) <7.0 %      Assessment & Plan:   Problem List Items Addressed This Visit      Unprioritized   Diabetic peripheral neuropathy (HCC)    OK for diabetic shoes.  Form filled out      Essential hypertension, benign    Stable, continue present medications.        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  Unable to take Metformin due to stomach issues.  Increase exercise      Relevant Orders   Comprehensive metabolic panel   Lipid Panel  w/o Chol/HDL Ratio   Bayer DCA Hb A1c Waived       Follow up plan: Return in about 3 months (around 01/20/2017).

## 2016-10-20 NOTE — Assessment & Plan Note (Signed)
OK for diabetic shoes.  Form filled out

## 2016-10-20 NOTE — Assessment & Plan Note (Addendum)
Hgb A1C 7.2.  Unable to take Metformin due to stomach issues.  Increase exercise

## 2016-10-21 ENCOUNTER — Encounter: Payer: Self-pay | Admitting: Unknown Physician Specialty

## 2016-10-21 ENCOUNTER — Telehealth: Payer: Self-pay | Admitting: Unknown Physician Specialty

## 2016-10-21 LAB — COMPREHENSIVE METABOLIC PANEL
ALK PHOS: 130 IU/L — AB (ref 39–117)
ALT: 27 IU/L (ref 0–44)
AST: 22 IU/L (ref 0–40)
Albumin/Globulin Ratio: 2.4 — ABNORMAL HIGH (ref 1.2–2.2)
Albumin: 4.5 g/dL (ref 3.5–4.8)
BILIRUBIN TOTAL: 0.5 mg/dL (ref 0.0–1.2)
BUN/Creatinine Ratio: 15 (ref 10–24)
BUN: 13 mg/dL (ref 8–27)
CHLORIDE: 103 mmol/L (ref 96–106)
CO2: 17 mmol/L — ABNORMAL LOW (ref 20–29)
Calcium: 8.7 mg/dL (ref 8.6–10.2)
Creatinine, Ser: 0.87 mg/dL (ref 0.76–1.27)
GFR calc Af Amer: 99 mL/min/{1.73_m2} (ref >59)
GFR calc non Af Amer: 86 mL/min/{1.73_m2} (ref >59)
GLOBULIN, TOTAL: 1.9 g/dL (ref 1.5–4.5)
GLUCOSE: 138 mg/dL — AB (ref 65–99)
POTASSIUM: 4.1 mmol/L (ref 3.5–5.2)
SODIUM: 140 mmol/L (ref 134–144)
Total Protein: 6.4 g/dL (ref 6.0–8.5)

## 2016-10-21 LAB — LIPID PANEL W/O CHOL/HDL RATIO
CHOLESTEROL TOTAL: 128 mg/dL (ref 100–199)
HDL: 30 mg/dL — ABNORMAL LOW (ref >39)
LDL Calculated: 63 mg/dL (ref 0–99)
Triglycerides: 175 mg/dL — ABNORMAL HIGH (ref 0–149)
VLDL Cholesterol Cal: 35 mg/dL (ref 5–40)

## 2016-10-21 NOTE — Telephone Encounter (Signed)
Forms faxed and placed in scan basket to be scanned into chart.

## 2016-10-21 NOTE — Telephone Encounter (Signed)
Patient's wife stopped by to drop off paperwork for therapeutic shoes that was suppose to be given to provider during appointment on 10/20/2016.  Paper work placed in Community education officerprovider mail box.  Please Advise.  Thank you

## 2016-10-30 LAB — BAYER DCA HB A1C WAIVED: HB A1C: 7.2 % — AB (ref <7.0)

## 2017-01-19 ENCOUNTER — Telehealth: Payer: Self-pay | Admitting: Unknown Physician Specialty

## 2017-01-19 NOTE — Telephone Encounter (Signed)
Called pt to sched for Annual Wellness Visit with Nurse Health Advisor. C/b #  [phone]  Manuela Schwartz AFTER 10/20

## 2017-01-22 ENCOUNTER — Ambulatory Visit (INDEPENDENT_AMBULATORY_CARE_PROVIDER_SITE_OTHER): Payer: Medicare HMO

## 2017-01-22 VITALS — BP 109/67 | HR 58 | Temp 97.7°F | Resp 16 | Ht 67.0 in | Wt 141.0 lb

## 2017-01-22 DIAGNOSIS — Z23 Encounter for immunization: Secondary | ICD-10-CM | POA: Diagnosis not present

## 2017-01-22 DIAGNOSIS — Z0000 Encounter for general adult medical examination without abnormal findings: Secondary | ICD-10-CM

## 2017-01-22 DIAGNOSIS — Z1159 Encounter for screening for other viral diseases: Secondary | ICD-10-CM | POA: Diagnosis not present

## 2017-01-22 NOTE — Patient Instructions (Addendum)
[name] , Thank you for taking time to come for your Medicare Wellness Visit. I appreciate your ongoing commitment to your health goals. Please review the following plan we discussed and let me know if I can assist you in the future.   Screening recommendations/referrals: Colonoscopy: completed 08/04/2013 Recommended yearly ophthalmology/optometry visit for glaucoma screening and checkup Recommended yearly dental visit for hygiene and checkup  Vaccinations: Influenza vaccine: due today Pneumococcal vaccine: up to date Tdap vaccine: due, check with your insurance company for coverage Shingles vaccine: up to date  Advanced directives: Please bring a copy of your health care power of attorney and living will to the office at your convenience.  Conditions/risks identified: Recommend drinking at least 6-8 glasses of water a day  Next appointment: Follow up in one year for your annual wellness exam. Follow up 01/27/2017 at 10:45 am with Phineas Inchesheryl Wicker,NP.  Preventive Care 5465 Years and Older, Male Preventive care refers to lifestyle choices and visits with your health care provider that can promote health and wellness. What does preventive care include?  A yearly physical exam. This is also called an annual well check.  Dental exams once or twice a year.  Routine eye exams. Ask your health care provider how often you should have your eyes checked.  Personal lifestyle choices, including:  Daily care of your teeth and gums.  Regular physical activity.  Eating a healthy diet.  Avoiding tobacco and drug use.  Limiting alcohol use.  Practicing safe sex.  Taking low doses of aspirin every day.  Taking vitamin and mineral supplements as recommended by your health care provider. What happens during an annual well check? The services and screenings done by your health care provider during your annual well check will depend on your age, overall health, lifestyle risk factors, and  family history of disease. Counseling  Your health care provider may ask you questions about your:  Alcohol use.  Tobacco use.  Drug use.  Emotional well-being.  Home and relationship well-being.  Sexual activity.  Eating habits.  History of falls.  Memory and ability to understand (cognition).  Work and work [employer]. Screening  You may have the following tests or measurements:  Height, weight, and BMI.  Blood pressure.  Lipid and cholesterol levels. These may be checked every 5 years, or more frequently if you are over 73 years old.  Skin check.  Lung cancer screening. You may have this screening every year starting at age 73 if you have a 30-pack-year history of smoking and currently smoke or have quit within the past 15 years.  Fecal occult blood test (FOBT) of the stool. You may have this test every year starting at age 73.  Flexible sigmoidoscopy or colonoscopy. You may have a sigmoidoscopy every 5 years or a colonoscopy every 10 years starting at age 73.  Prostate cancer screening. Recommendations will vary depending on your family history and other risks.  Hepatitis C blood test.  Hepatitis B blood test.  Sexually transmitted disease (STD) testing.  Diabetes screening. This is done by checking your blood sugar (glucose) after you have not eaten for a while (fasting). You may have this done every 1-3 years.  Abdominal aortic aneurysm (AAA) screening. You may need this if you are a current or former smoker.  Osteoporosis. You may be screened starting at age 73 if you are at high risk. Talk with your health care provider about your test results, treatment options, and if necessary, the need for more  tests. Vaccines  Your health care provider may recommend certain vaccines, such as:  Influenza vaccine. This is recommended every year.  Tetanus, diphtheria, and acellular pertussis (Tdap, Td) vaccine. You may need a Td booster every 10 years.  Zoster  vaccine. You may need this after age 82.  Pneumococcal 13-valent conjugate (PCV13) vaccine. One dose is recommended after age 26.  Pneumococcal polysaccharide (PPSV23) vaccine. One dose is recommended after age 35. Talk to your health care provider about which screenings and vaccines you need and how often you need them. This information is not intended to replace advice given to you by your health care provider. Make sure you discuss any questions you have with your health care provider. Document Released: 04/20/2015 Document Revised: 12/12/2015 Document Reviewed: 01/23/2015 Elsevier Interactive Patient Education  2017 [place] Prevention in the Home Falls can cause injuries. They can happen to people of all ages. There are many things you can do to make your home safe and to help prevent falls. What can I do on the outside of my home?  Regularly fix the edges of walkways and driveways and fix any cracks.  Remove anything that might make you trip as you walk through a door, such as a raised step or threshold.  Trim any bushes or trees on the path to your home.  Use bright outdoor lighting.  Clear any walking paths of anything that might make someone trip, such as rocks or tools.  Regularly check to see if handrails are loose or broken. Make sure that both sides of any steps have handrails.  Any raised decks and porches should have guardrails on the edges.  Have any leaves, snow, or ice cleared regularly.  Use sand or salt on walking paths during winter.  Clean up any spills in your garage right away. This includes oil or grease spills. What can I do in the bathroom?  Use night lights.  Install grab bars by the toilet and in the tub and shower. Do not use towel bars as grab bars.  Use non-skid mats or decals in the tub or shower.  If you need to sit down in the shower, use a plastic, non-slip stool.  Keep the floor dry. Clean up any water that spills on the  floor as soon as it happens.  Remove soap buildup in the tub or shower regularly.  Attach bath mats securely with double-sided non-slip rug tape.  Do not have throw rugs and other things on the floor that can make you trip. What can I do in the bedroom?  Use night lights.  Make sure that you have a light by your bed that is easy to reach.  Do not use any sheets or blankets that are too big for your bed. They should not hang down onto the floor.  Have a firm chair that has side arms. You can use this for support while you get dressed.  Do not have throw rugs and other things on the floor that can make you trip. What can I do in the kitchen?  Clean up any spills right away.  Avoid walking on wet floors.  Keep items that you use a lot in easy-to-reach places.  If you need to reach something above you, use a strong step stool that has a grab bar.  Keep electrical cords out of the way.  Do not use floor polish or wax that makes floors slippery. If you must use wax, use non-skid floor  wax.  Do not have throw rugs and other things on the floor that can make you trip. What can I do with my stairs?  Do not leave any items on the stairs.  Make sure that there are handrails on both sides of the stairs and use them. Fix handrails that are broken or loose. Make sure that handrails are as long as the stairways.  Check any carpeting to make sure that it is firmly attached to the stairs. Fix any carpet that is loose or worn.  Avoid having throw rugs at the top or bottom of the stairs. If you do have throw rugs, attach them to the floor with carpet tape.  Make sure that you have a light switch at the top of the stairs and the bottom of the stairs. If you do not have them, ask someone to add them for you. What else can I do to help prevent falls?  Wear shoes that:  Do not have high heels.  Have rubber bottoms.  Are comfortable and fit you well.  Are closed at the toe. Do not wear  sandals.  If you use a stepladder:  Make sure that it is fully opened. Do not climb a closed stepladder.  Make sure that both sides of the stepladder are locked into place.  Ask someone to hold it for you, if possible.  Clearly mark and make sure that you can see:  Any grab bars or handrails.  First and last steps.  Where the edge of each step is.  Use tools that help you move around (mobility aids) if they are needed. These include:  Canes.  Walkers.  Scooters.  Crutches.  Turn on the lights when you go into a dark area. Replace any light bulbs as soon as they burn out.  Set up your furniture so you have a clear path. Avoid moving your furniture around.  If any of your floors are uneven, fix them.  If there are any pets around you, be aware of where they are.  Review your medicines with your doctor. Some medicines can make you feel dizzy. This can increase your chance of falling. Ask your doctor what other things that you can do to help prevent falls. This information is not intended to replace advice given to you by your health care provider. Make sure you discuss any questions you have with your health care provider. Document Released: 01/18/2009 Document Revised: 08/30/2015 Document Reviewed: 04/28/2014 Elsevier Interactive Patient Education  2017 [place].  Influenza (Flu) Vaccine (Inactivated or Recombinant): What You Need to Know 1. Why get vaccinated? Influenza ("flu") is a contagious disease that spreads around the [place] every year, usually between October and May. Flu is caused by influenza viruses, and is spread mainly by coughing, sneezing, and close contact. Anyone can get flu. Flu strikes suddenly and can last several days. Symptoms vary by age, but can include:  fever/chills  sore throat  muscle aches  fatigue  cough  headache  runny or stuffy nose  Flu can also lead to pneumonia and blood infections, and cause diarrhea and  seizures in children. If you have a medical condition, such as heart or lung disease, flu can make it worse. Flu is more dangerous for some people. Infants and young children, people 20 years of age and older, pregnant women, and people with certain health conditions or a weakened immune system are at greatest risk. Each year thousands of people in the [place] die  from flu, and many more are hospitalized. Flu vaccine can:  keep you from getting flu,  make flu less severe if you do get it, and  keep you from spreading flu to your family and other people. 2. Inactivated and recombinant flu vaccines A dose of flu vaccine is recommended every flu season. Children 6 months through 34 years of age may need two doses during the same flu season. Everyone else needs only one dose each flu season. Some inactivated flu vaccines contain a very small amount of a mercury-based preservative called thimerosal. Studies have not shown thimerosal in vaccines to be harmful, but flu vaccines that do not contain thimerosal are available. There is no live flu virus in flu shots. They cannot cause the flu. There are many flu viruses, and they are always changing. Each year a new flu vaccine is made to protect against three or four viruses that are likely to cause disease in the upcoming flu season. But even when the vaccine doesn't exactly match these viruses, it may still provide some protection. Flu vaccine cannot prevent:  flu that is caused by a virus not covered by the vaccine, or  illnesses that look like flu but are not.  It takes about 2 weeks for protection to develop after vaccination, and protection lasts through the flu season. 3. Some people should not get this vaccine Tell the person who is giving you the vaccine:  If you have any severe, life-threatening allergies. If you ever had a life-threatening allergic reaction after a dose of flu vaccine, or have a severe allergy to any part of this  vaccine, you may be advised not to get vaccinated. Most, but not all, types of flu vaccine contain a small amount of egg protein.  If you ever had Guillain-Barr Syndrome (also called GBS). Some people with a history of GBS should not get this vaccine. This should be discussed with your doctor.  If you are not feeling well. It is usually okay to get flu vaccine when you have a mild illness, but you might be asked to come back when you feel better.  4. Risks of a vaccine reaction With any medicine, including vaccines, there is a chance of reactions. These are usually mild and go away on their own, but serious reactions are also possible. Most people who get a flu shot do not have any problems with it. Minor problems following a flu shot include:  soreness, redness, or swelling where the shot was given  hoarseness  sore, red or itchy eyes  cough  fever  aches  headache  itching  fatigue  If these problems occur, they usually begin soon after the shot and last 1 or 2 days. More serious problems following a flu shot can include the following:  There may be a small increased risk of Guillain-Barre Syndrome (GBS) after inactivated flu vaccine. This risk has been estimated at 1 or 2 additional cases per million people vaccinated. This is much lower than the risk of severe complications from flu, which can be prevented by flu vaccine.  Young children who get the flu shot along with pneumococcal vaccine (PCV13) and/or DTaP vaccine at the same time might be slightly more likely to have a seizure caused by fever. Ask your doctor for more information. Tell your doctor if a child who is getting flu vaccine has ever had a seizure.  Problems that could happen after any injected vaccine:  People sometimes faint after a medical procedure, including  vaccination. Sitting or lying down for about 15 minutes can help prevent fainting, and injuries caused by a fall. Tell your doctor if you feel dizzy,  or have vision changes or ringing in the ears.  Some people get severe pain in the shoulder and have difficulty moving the arm where a shot was given. This happens very rarely.  Any medication can cause a severe allergic reaction. Such reactions from a vaccine are very rare, estimated at about 1 in a million doses, and would happen within a few minutes to a few hours after the vaccination. As with any medicine, there is a very remote chance of a vaccine causing a serious injury or death. The safety of vaccines is always being monitored. For more information, visit: http://www.aguilar.org/ 5. What if there is a serious reaction? What should I look for? Look for anything that concerns you, such as signs of a severe allergic reaction, very high fever, or unusual behavior. Signs of a severe allergic reaction can include hives, swelling of the face and throat, difficulty breathing, a fast heartbeat, dizziness, and weakness. These would start a few minutes to a few hours after the vaccination. What should I do?  If you think it is a severe allergic reaction or other emergency that can't wait, call 9-1-1 and get the person to the nearest hospital. Otherwise, call your doctor.  Reactions should be reported to the Vaccine Adverse Event Reporting System (VAERS). Your doctor should file this report, or you can do it yourself through the VAERS web site at www.vaers.SamedayNews.es, or by calling 806 553 5523. ? VAERS does not give medical advice. 6. The National Vaccine Injury Compensation Program The [employer] Vaccine Injury Compensation Program (VICP) is a federal program that was created to compensate people who may have been injured by certain vaccines. Persons who believe they may have been injured by a vaccine can learn about the program and about filing a claim by calling 2516646597 or visiting the [place] website at GoldCloset.com.ee. There is a time limit to file a claim for  compensation. 7. How can I learn more?  Ask your healthcare provider. He or she can give you the vaccine package insert or suggest other sources of information.  Call your local or state health department.  Contact the Centers for Disease Control and Prevention (CDC): ? Call 567-590-2636 (1-800-CDC-INFO) or ? Visit CDC's website at https://gibson.com/ Vaccine Information Statement, Inactivated Influenza Vaccine (11/11/2013) This information is not intended to replace advice given to you by your health care provider. Make sure you discuss any questions you have with your health care provider. Document Released: 01/16/2006 Document Revised: 12/13/2015 Document Reviewed: 12/13/2015 Elsevier Interactive Patient Education  2017 [employer].

## 2017-01-22 NOTE — Progress Notes (Signed)
Subjective:   Matthew Wolf is a 71 y.o. male who presents for Medicare Annual/Subsequent preventive examination.  Review of Systems:  Cardiac Risk Factors include: male gender;advanced age (>72men, >36 women);diabetes mellitus;dyslipidemia;hypertension     Objective:    Vitals: BP 109/67 (BP Location: Left Arm, Patient Position: Sitting)   Pulse (!) 58   Temp 97.7 F (36.5 C) (Oral)   Resp 16   Ht 5\' 7"  (1.702 m)   Wt 141 lb (64 kg)   BMI 22.08 kg/m   Body mass index is 22.08 kg/m.  Tobacco History  Smoking Status  . Former Smoker  . Types: Cigarettes  . Quit date: 04/07/1984  Smokeless Tobacco  . Never Used     Counseling given: Not Answered   Past Medical History:  Diagnosis Date  . BPH (benign prostatic hypertrophy)   . CKD (chronic kidney disease) stage 3, GFR 30-59 ml/min (HCC)   . Diabetes (HCC)   . Diabetic peripheral neuropathy (HCC)   . HBP (high blood pressure)   . Heart trouble   . Hyperlipidemia   . Hypertensive kidney disease   . MI (myocardial infarction) (HCC) 1991, 1996   Past Surgical History:  Procedure Laterality Date  . ANGIOPLASTY     x 2  . CARDIAC SURGERY    . CATARACT EXTRACTION, BILATERAL  06/2015  . CORONARY ARTERY BYPASS GRAFT  1991  . HERNIA REPAIR     Family History  Problem Relation Age of Onset  . Diabetes Mother   . Heart disease Mother   . Hypertension Mother   . Emphysema Father   . Cancer Father        lung  . Cancer Sister        breast  . Heart disease Maternal Aunt   . Stroke Neg Hx    History  Sexual Activity  . Sexual activity: Yes    Outpatient Encounter Prescriptions as of 01/22/2017  Medication Sig  . aspirin EC 325 MG tablet Take 325 mg by mouth daily.  [name] atenolol (TENORMIN) 50 MG tablet Take 50 mg by mouth.   [name] atorvastatin (LIPITOR) 80 MG tablet Take 80 mg by mouth at bedtime.   [name] glipiZIDE (GLUCOTROL XL) 5 MG 24 hr tablet TAKE 1 TABLET (5 MG TOTAL) BY MOUTH DAILY WITH BREAKFAST.  . ramipril  (ALTACE) 5 MG capsule TAKE 1 CAPSULE (5 MG TOTAL) BY MOUTH EVERY MORNING.  . NONFORMULARY OR COMPOUNDED ITEM Apply 1-2 g topically 4 (four) times daily. Peripheral Neuropathy cream: Bupivacaine 1% Doxepin 3% Gabapentin 6% Ibuprofen 3% Pentoxifylline 3% (Patient not taking: Reported on 01/22/2017)  . [DISCONTINUED] gabapentin (NEURONTIN) 600 MG tablet TAKE 1 TAB BY MOUTH EVERY MORNING, 1 EVERY EARLY AFTERNOON, AND 1.5 EVERY NIGHT (Patient not taking: Reported on 01/22/2017)   No facility-administered encounter medications on file as of 01/22/2017.     Activities of Daily Living In your present state of health, do you have any difficulty performing the following activities: 01/22/2017 02/25/2016  Hearing? N N  Vision? N N  Difficulty concentrating or making decisions? N N  Walking or climbing stairs? N N  Dressing or bathing? N N  Doing errands, shopping? N N  Preparing Food and eating ? N -  Using the Toilet? N -  In the past six months, have you accidently leaked urine? N -  Do you have problems with loss of bowel control? N -  Managing your Medications? N -  Managing your Finances? N -  Housekeeping or managing your Housekeeping? N -  Some recent data might be hidden    Patient Care Team: Gabriel CirriWicker, Cheryl, NP as PCP - General (Nurse Practitioner) Raynelle Janoe, Matthew, MD as Referring Physician (Cardiology)   Assessment:     Exercise Activities and Dietary recommendations Current Exercise Habits: The patient does not participate in regular exercise at present;The patient has a physically strenous job, but has no regular exercise apart from work., Exercise limited by: None identified  Goals    . Increase water intake          Recommend drinking at least 6-8 glasses of water a day      Fall Risk Fall Risk  01/22/2017 02/25/2016 03/12/2015  Falls in the past year? No No No   Depression Screen PHQ 2/9 Scores 01/22/2017 02/25/2016 03/12/2015  PHQ - 2 Score 0 0 0    Cognitive  Function     6CIT Screen 01/22/2017  What Year? 0 points  What month? 0 points  What time? 0 points  Count back from 20 0 points  Months in reverse 0 points  Repeat phrase 0 points  Total Score 0    Immunization History  Administered Date(s) Administered  . Influenza, High Dose Seasonal PF 12/25/2015, 01/22/2017  . Influenza,inj,Quad PF,6+ Mos 03/16/2015  . Pneumococcal Conjugate-13 09/27/2013  . Pneumococcal Polysaccharide-23 12/21/2006, 12/22/2011  . Zoster 12/21/2006   Screening Tests Health Maintenance  Topic Date Due  . Hepatitis C Screening  1945-07-18  . FOOT EXAM  12/24/2016  . TETANUS/TDAP  10/20/2017 (Originally 07/01/1964)  . HEMOGLOBIN A1C  04/22/2017  . OPHTHALMOLOGY EXAM  09/09/2017  . COLONOSCOPY  08/05/2018  . INFLUENZA VACCINE  Completed  . PNA vac Low Risk Adult  Completed      Plan:    I have personally reviewed and addressed the Medicare Annual Wellness questionnaire and have noted the following in the patient's chart:  A. Medical and social history B. Use of alcohol, tobacco or illicit drugs  C. Current medications and supplements D. Functional ability and status E.  Nutritional status F.  Physical activity G. Advance directives H. List of other physicians I.  Hospitalizations, surgeries, and ER visits in previous 12 months J.  Vitals K. Screenings such as hearing and vision if needed, cognitive and depression L. Referrals and appointments   In addition, I have reviewed and discussed with patient certain preventive protocols, quality metrics, and best practice recommendations. A written personalized care plan for preventive services as well as general preventive health recommendations were provided to patient.   Signed,  Marin Robertsiffany Hill, LPN Nurse Health Advisor   MD Recommendations: none

## 2017-01-27 ENCOUNTER — Ambulatory Visit (INDEPENDENT_AMBULATORY_CARE_PROVIDER_SITE_OTHER): Payer: Medicare HMO | Admitting: Unknown Physician Specialty

## 2017-01-27 ENCOUNTER — Encounter: Payer: Self-pay | Admitting: Unknown Physician Specialty

## 2017-01-27 VITALS — BP 121/69 | HR 64 | Temp 97.5°F | Wt 141.2 lb

## 2017-01-27 DIAGNOSIS — E1142 Type 2 diabetes mellitus with diabetic polyneuropathy: Secondary | ICD-10-CM

## 2017-01-27 DIAGNOSIS — N182 Chronic kidney disease, stage 2 (mild): Secondary | ICD-10-CM

## 2017-01-27 DIAGNOSIS — E1122 Type 2 diabetes mellitus with diabetic chronic kidney disease: Secondary | ICD-10-CM

## 2017-01-27 DIAGNOSIS — I129 Hypertensive chronic kidney disease with stage 1 through stage 4 chronic kidney disease, or unspecified chronic kidney disease: Secondary | ICD-10-CM | POA: Diagnosis not present

## 2017-01-27 LAB — BAYER DCA HB A1C WAIVED: HB A1C (BAYER DCA - WAIVED): 7.1 % — ABNORMAL HIGH (ref <7.0)

## 2017-01-27 MED ORDER — NORTRIPTYLINE HCL 10 MG PO CAPS: 10.0000 mg | ORAL_CAPSULE | Freq: Every day | ORAL | 1 refills | Status: DC

## 2017-01-27 NOTE — Progress Notes (Signed)
BP 121/69   Pulse 64   Temp (!) 97.5 F (36.4 C)   Wt 141 lb 3.2 oz (64 kg)   SpO2 96%   BMI 22.12 kg/m    Subjective:    Patient ID: Matthew Wolf, male    DOB: 10-Oct-1945, 71 y.o.   MRN: [id number]  HPI: Matthew Wolf is a 71 y.o. male  Chief Complaint  Patient presents with  . Diabetes  . Hyperlipidemia  . Hypertension  . Foot Pain    pt states he has had bilateral foot pain for a while, states it is worse at night. Has tried Gabapentin which did not help, and Lyrica was too expensive per patient.    Neuropathy  Diabetes: Using medications without difficulties No hypoglycemic episodes No hyperglycemic episodes Feet problems: Pt is having a great deal of pain as above.  Referred to podiatry without any help.  Gabapentin not working.  Lyrica too expensive.  Compounded cream was not helping.  Tried a TENS unit Blood Sugars averaging: eye exam within last year Last Hgb A1C:  Hypertension  Using medications without difficulty Average home BPs   Using medication without problems or lightheadedness No chest pain with exertion or shortness of breath No Edema  Elevated Cholesterol Using medications without problems No Muscle aches  Diet: Exercise: Stays active  Relevant past medical, surgical, family and social history reviewed and updated as indicated. Interim medical history since our last visit reviewed. Allergies and medications reviewed and updated.  Review of Systems  Per HPI unless specifically indicated above     Objective:    BP 121/69   Pulse 64   Temp (!) 97.5 F (36.4 C)   Wt 141 lb 3.2 oz (64 kg)   SpO2 96%   BMI 22.12 kg/m   Wt Readings from Last 3 Encounters:  01/27/17 141 lb 3.2 oz (64 kg)  01/22/17 141 lb (64 kg)  10/20/16 146 lb 3.2 oz (66.3 kg)    Physical Exam  Constitutional: He is oriented to person, place, and time. He appears well-developed and well-nourished. No distress.  HENT:  Head: Normocephalic and atraumatic.    Eyes: Conjunctivae and lids are normal. Right eye exhibits no discharge. Left eye exhibits no discharge. No scleral icterus.  Neck: Normal range of motion. Neck supple. No JVD present. Carotid bruit is not present.  Cardiovascular: Normal rate, regular rhythm and normal heart sounds.   Pulmonary/Chest: Effort normal and breath sounds normal. No respiratory distress.  Abdominal: Normal appearance. There is no splenomegaly or hepatomegaly.  Musculoskeletal: Normal range of motion.  Neurological: He is alert and oriented to person, place, and time.  Skin: Skin is warm, dry and intact. No rash noted. No pallor.  Psychiatric: He has a normal mood and affect. His behavior is normal. Judgment and thought content normal.    Results for orders placed or performed in visit on 10/21/16  HM DIABETES EYE EXAM  Result Value Ref Range   HM Diabetic Eye Exam No Retinopathy No Retinopathy      Assessment & Plan:   Problem List Items Addressed This Visit      Unprioritized   Diabetic peripheral neuropathy (HCC)    Will rx Nortriptyline to see if it helps with neuropathy.        Relevant Medications   nortriptyline (PAMELOR) 10 MG capsule   Hypertensive kidney disease    Stable, continue present medications.        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

## 2017-01-27 NOTE — Assessment & Plan Note (Addendum)
Will rx Nortriptyline to see if it helps with neuropathy.

## 2017-01-27 NOTE — Assessment & Plan Note (Signed)
Stable, continue present medications.   

## 2017-01-27 NOTE — Assessment & Plan Note (Addendum)
Hgb A1C is 7.1.  Work on diet and exercise

## 2017-01-28 ENCOUNTER — Encounter: Payer: Self-pay | Admitting: Unknown Physician Specialty

## 2017-01-28 LAB — COMPREHENSIVE METABOLIC PANEL
ALK PHOS: 127 IU/L — AB (ref 39–117)
ALT: 18 IU/L (ref 0–44)
AST: 18 IU/L (ref 0–40)
Albumin/Globulin Ratio: 2.2 (ref 1.2–2.2)
Albumin: 4.4 g/dL (ref 3.5–4.8)
BUN/Creatinine Ratio: 20 (ref 10–24)
BUN: 16 mg/dL (ref 8–27)
Bilirubin Total: 0.8 mg/dL (ref 0.0–1.2)
CALCIUM: 9.1 mg/dL (ref 8.6–10.2)
CO2: 21 mmol/L (ref 20–29)
CREATININE: 0.79 mg/dL (ref 0.76–1.27)
Chloride: 99 mmol/L (ref 96–106)
GFR calc Af Amer: 103 mL/min/{1.73_m2} (ref >59)
GFR, EST NON AFRICAN AMERICAN: 89 mL/min/{1.73_m2} (ref >59)
GLUCOSE: 76 mg/dL (ref 65–99)
Globulin, Total: 2 g/dL (ref 1.5–4.5)
Potassium: 4.3 mmol/L (ref 3.5–5.2)
Sodium: 137 mmol/L (ref 134–144)
Total Protein: 6.4 g/dL (ref 6.0–8.5)

## 2017-02-06 ENCOUNTER — Other Ambulatory Visit: Payer: Self-pay | Admitting: Unknown Physician Specialty

## 2017-03-04 ENCOUNTER — Encounter: Payer: Self-pay | Admitting: Unknown Physician Specialty

## 2017-03-04 ENCOUNTER — Ambulatory Visit (INDEPENDENT_AMBULATORY_CARE_PROVIDER_SITE_OTHER): Payer: Medicare HMO | Admitting: Unknown Physician Specialty

## 2017-03-04 DIAGNOSIS — E1142 Type 2 diabetes mellitus with diabetic polyneuropathy: Secondary | ICD-10-CM

## 2017-03-04 DIAGNOSIS — I129 Hypertensive chronic kidney disease with stage 1 through stage 4 chronic kidney disease, or unspecified chronic kidney disease: Secondary | ICD-10-CM

## 2017-03-04 DIAGNOSIS — N182 Chronic kidney disease, stage 2 (mild): Secondary | ICD-10-CM | POA: Diagnosis not present

## 2017-03-04 DIAGNOSIS — E1122 Type 2 diabetes mellitus with diabetic chronic kidney disease: Secondary | ICD-10-CM | POA: Diagnosis not present

## 2017-03-04 MED ORDER — NORTRIPTYLINE HCL 25 MG PO CAPS: 25.0000 mg | ORAL_CAPSULE | Freq: Every day | ORAL | 1 refills | Status: DC

## 2017-03-04 NOTE — Assessment & Plan Note (Signed)
Some improvement but still a problem.  Increase Nortriptyline to 25 m QHS.  OK to titrate to 50 mg as needed

## 2017-03-04 NOTE — Progress Notes (Addendum)
BP 116/71   Pulse 64   Temp (!) 97.4 F (36.3 C) (Oral)   Wt 139 lb 12.8 oz (63.4 kg)   SpO2 98%   BMI 21.90 kg/m    Subjective:    Patient ID: Matthew Wolf, male    DOB: May 10, 1945, 71 y.o.   MRN: [id number]  HPI: Matthew Wolf is a 71 y.o. male  Chief Complaint  Patient presents with  . Diabetic Neuropathy    4 week f/up- pt states the medication is helping a little bit   F/U of diabetic neuropathy.  Started Nortrytipline which seems to help.  Only on 10 m and wonders if he can increase the dose.  DM not to goal but stable.  Last Hgb A1C was 7.1.  No change in blood sugar readings  Relevant past medical, surgical, family and social history reviewed and updated as indicated. Interim medical history since our last visit reviewed. Allergies and medications reviewed and updated.  Review of Systems  Constitutional: Negative.   Respiratory: Negative.   Cardiovascular: Negative.   Psychiatric/Behavioral: Negative.     Per HPI unless specifically indicated above     Objective:    BP 116/71   Pulse 64   Temp (!) 97.4 F (36.3 C) (Oral)   Wt 139 lb 12.8 oz (63.4 kg)   SpO2 98%   BMI 21.90 kg/m   Wt Readings from Last 3 Encounters:  03/04/17 139 lb 12.8 oz (63.4 kg)  01/27/17 141 lb 3.2 oz (64 kg)  01/22/17 141 lb (64 kg)    Physical Exam  Constitutional: He is oriented to person, place, and time. He appears well-developed and well-nourished. No distress.  HENT:  Head: Normocephalic and atraumatic.  Eyes: Conjunctivae and lids are normal. Right eye exhibits no discharge. Left eye exhibits no discharge. No scleral icterus.  Cardiovascular: Normal rate.  Pulmonary/Chest: Effort normal.  Abdominal: Normal appearance. There is no splenomegaly or hepatomegaly.  Musculoskeletal: Normal range of motion.  Neurological: He is alert and oriented to person, place, and time.  Skin: Skin is intact. No rash noted. No pallor.  Psychiatric: He has a normal mood and affect.  His behavior is normal. Judgment and thought content normal.    Results for orders placed or performed in visit on 01/27/17  Comprehensive metabolic panel  Result Value Ref Range   Glucose 76 65 - 99 mg/dL   BUN 16 8 - 27 mg/dL   Creatinine, Ser 7.820.79 0.76 - 1.27 mg/dL   GFR calc non Af Amer 89 >59 mL/min/1.73   GFR calc Af Amer 103 >59 mL/min/1.73   BUN/Creatinine Ratio 20 10 - 24   Sodium 137 134 - 144 mmol/L   Potassium 4.3 3.5 - 5.2 mmol/L   Chloride 99 96 - 106 mmol/L   CO2 21 20 - 29 mmol/L   Calcium 9.1 8.6 - 10.2 mg/dL   Total Protein 6.4 6.0 - 8.5 g/dL   Albumin 4.4 3.5 - 4.8 g/dL   Globulin, Total 2.0 1.5 - 4.5 g/dL   Albumin/Globulin Ratio 2.2 1.2 - 2.2   Bilirubin Total 0.8 0.0 - 1.2 mg/dL   Alkaline Phosphatase 127 (H) 39 - 117 IU/L   AST 18 0 - 40 IU/L   ALT 18 0 - 44 IU/L  Bayer DCA Hb A1c Waived  Result Value Ref Range   Bayer DCA Hb A1c Waived 7.1 (H) <7.0 %      Assessment & Plan:   Problem List Items  Addressed This Visit      Unprioritized   Diabetic peripheral neuropathy (HCC)    Some improvement but still a problem.  Increase Nortriptyline to 25 m QHS.  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  Recheck BS in 2 months          Follow up plan: Return in about 2 months (around 05/04/2017) for regular diabetes visit.

## 2017-03-04 NOTE — Assessment & Plan Note (Signed)
Continue with life-style changes.  Recheck BS in 2 months

## 2017-03-10 DIAGNOSIS — H401131 Primary open-angle glaucoma, bilateral, mild stage: Secondary | ICD-10-CM | POA: Diagnosis not present

## 2017-03-10 DIAGNOSIS — H353131 Nonexudative age-related macular degeneration, bilateral, early dry stage: Secondary | ICD-10-CM | POA: Diagnosis not present

## 2017-03-17 DIAGNOSIS — I10 Essential (primary) hypertension: Secondary | ICD-10-CM | POA: Diagnosis not present

## 2017-03-17 DIAGNOSIS — I2510 Atherosclerotic heart disease of native coronary artery without angina pectoris: Secondary | ICD-10-CM | POA: Diagnosis not present

## 2017-03-17 DIAGNOSIS — E782 Mixed hyperlipidemia: Secondary | ICD-10-CM | POA: Diagnosis not present

## 2017-03-17 DIAGNOSIS — E1100 Type 2 diabetes mellitus with hyperosmolarity without nonketotic hyperglycemic-hyperosmolar coma (NKHHC): Secondary | ICD-10-CM | POA: Diagnosis not present

## 2017-03-22 ENCOUNTER — Other Ambulatory Visit: Payer: Self-pay | Admitting: Unknown Physician Specialty

## 2017-05-04 ENCOUNTER — Encounter: Payer: Self-pay | Admitting: Unknown Physician Specialty

## 2017-05-04 ENCOUNTER — Ambulatory Visit (INDEPENDENT_AMBULATORY_CARE_PROVIDER_SITE_OTHER): Payer: Medicare HMO | Admitting: Unknown Physician Specialty

## 2017-05-04 VITALS — BP 114/68 | HR 71 | Temp 97.5°F | Wt 141.8 lb

## 2017-05-04 DIAGNOSIS — I10 Essential (primary) hypertension: Secondary | ICD-10-CM | POA: Diagnosis not present

## 2017-05-04 DIAGNOSIS — E1142 Type 2 diabetes mellitus with diabetic polyneuropathy: Secondary | ICD-10-CM | POA: Diagnosis not present

## 2017-05-04 DIAGNOSIS — I129 Hypertensive chronic kidney disease with stage 1 through stage 4 chronic kidney disease, or unspecified chronic kidney disease: Secondary | ICD-10-CM

## 2017-05-04 DIAGNOSIS — N182 Chronic kidney disease, stage 2 (mild): Secondary | ICD-10-CM

## 2017-05-04 DIAGNOSIS — E785 Hyperlipidemia, unspecified: Secondary | ICD-10-CM

## 2017-05-04 DIAGNOSIS — E1122 Type 2 diabetes mellitus with diabetic chronic kidney disease: Secondary | ICD-10-CM | POA: Diagnosis not present

## 2017-05-04 LAB — BAYER DCA HB A1C WAIVED: HB A1C: 7.7 % — AB (ref <7.0)

## 2017-05-04 MED ORDER — GLIPIZIDE ER 10 MG PO TB24: 10.0000 mg | ORAL_TABLET | Freq: Every day | ORAL | 1 refills | Status: DC

## 2017-05-04 NOTE — Assessment & Plan Note (Signed)
Hgb A1C is 7.7%.  Intolerant to Metformin.  Increase Glipizide to 10 mg.

## 2017-05-04 NOTE — Assessment & Plan Note (Signed)
Stable, continue present medications.   

## 2017-05-04 NOTE — Assessment & Plan Note (Signed)
Tis is stable or Nortirptyline

## 2017-05-04 NOTE — Progress Notes (Signed)
   BP 114/68   Pulse 71   Temp (!) 97.5 F (36.4 C) (Oral)   Wt 141 lb 12.8 oz (64.3 kg)   SpO2 98%   BMI 22.21 kg/m    Subjective:    Patient ID: Matthew RakeJerry C Vidrio, male    DOB: 1946-01-14, 72 y.o.   MRN: [id number]  HPI: Matthew Wolf is a 72 y.o. male  Chief Complaint  Patient presents with  . Diabetes  . Hyperlipidemia  . Hypertension   Diabetes: Using medications without difficulties No hypoglycemic episodes No hyperglycemic episodes Feet problems: taking Nortryptiline and sleeping a little better Blood Sugars averaging: eye exam within last year Last Hgb A1C: 7.1  Hypertension  Using medications without difficulty Average home BPs   Using medication without problems or lightheadedness No chest pain with exertion or shortness of breath No Edema  Elevated Cholesterol Using medications without problems No Muscle aches  Diet: Exercise: Good diet and exercises   Relevant past medical, surgical, family and social history reviewed and updated as indicated. Interim medical history since our last visit reviewed. Allergies and medications reviewed and updated.  Review of Systems  Per HPI unless specifically indicated above     Objective:    BP 114/68   Pulse 71   Temp (!) 97.5 F (36.4 C) (Oral)   Wt 141 lb 12.8 oz (64.3 kg)   SpO2 98%   BMI 22.21 kg/m   Wt Readings from Last 3 Encounters:  05/04/17 141 lb 12.8 oz (64.3 kg)  03/04/17 139 lb 12.8 oz (63.4 kg)  01/27/17 141 lb 3.2 oz (64 kg)    Physical Exam  Constitutional: He is oriented to person, place, and time. He appears well-developed and well-nourished. No distress.  HENT:  Head: Normocephalic and atraumatic.  Eyes: Conjunctivae and lids are normal. Right eye exhibits no discharge. Left eye exhibits no discharge. No scleral icterus.  Neck: Normal range of motion. Neck supple. No JVD present. Carotid bruit is not present.  Cardiovascular: Normal rate, regular rhythm and normal heart sounds.    Pulmonary/Chest: Effort normal and breath sounds normal. No respiratory distress.  Abdominal: Normal appearance. There is no splenomegaly or hepatomegaly.  Musculoskeletal: Normal range of motion.  Neurological: He is alert and oriented to person, place, and time.  Skin: Skin is warm, dry and intact. No rash noted. No pallor.  Psychiatric: He has a normal mood and affect. His behavior is normal. Judgment and thought content normal.    Assessment & Plan:   Problem List Items Addressed This Visit      Unprioritized   Diabetic peripheral neuropathy (HCC)    Tis is stable or Nortirptyline      Relevant Medications   glipiZIDE (GLUCOTROL XL) 10 MG 24 hr tablet   Essential hypertension, benign    Stable, continue present medications.        Hyperlipidemia    Stable, continue present medications.        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  Intolerant to Metformin.  Increase Glipizide to 10 mg.        Relevant Medications   glipiZIDE (GLUCOTROL XL) 10 MG 24 hr tablet   Other Relevant Orders   Comprehensive metabolic panel   Bayer DCA Hb W0JA1c Waived       Follow up plan: Return in about 3 months (around 08/02/2017).

## 2017-05-05 LAB — COMPREHENSIVE METABOLIC PANEL
A/G RATIO: 2 (ref 1.2–2.2)
ALT: 14 IU/L (ref 0–44)
AST: 13 IU/L (ref 0–40)
Albumin: 4.5 g/dL (ref 3.5–4.8)
Alkaline Phosphatase: 139 IU/L — ABNORMAL HIGH (ref 39–117)
BILIRUBIN TOTAL: 0.4 mg/dL (ref 0.0–1.2)
BUN/Creatinine Ratio: 16 (ref 10–24)
BUN: 14 mg/dL (ref 8–27)
CALCIUM: 8.9 mg/dL (ref 8.6–10.2)
CHLORIDE: 100 mmol/L (ref 96–106)
CO2: 20 mmol/L (ref 20–29)
Creatinine, Ser: 0.89 mg/dL (ref 0.76–1.27)
GFR calc Af Amer: 98 mL/min/{1.73_m2} (ref >59)
GFR calc non Af Amer: 85 mL/min/{1.73_m2} (ref >59)
GLOBULIN, TOTAL: 2.3 g/dL (ref 1.5–4.5)
Glucose: 189 mg/dL — ABNORMAL HIGH (ref 65–99)
POTASSIUM: 4.3 mmol/L (ref 3.5–5.2)
SODIUM: 137 mmol/L (ref 134–144)
Total Protein: 6.8 g/dL (ref 6.0–8.5)

## 2017-05-25 DIAGNOSIS — Z791 Long term (current) use of non-steroidal anti-inflammatories (NSAID): Secondary | ICD-10-CM | POA: Diagnosis not present

## 2017-05-25 DIAGNOSIS — E785 Hyperlipidemia, unspecified: Secondary | ICD-10-CM | POA: Diagnosis not present

## 2017-05-25 DIAGNOSIS — E1140 Type 2 diabetes mellitus with diabetic neuropathy, unspecified: Secondary | ICD-10-CM | POA: Diagnosis not present

## 2017-05-25 DIAGNOSIS — I2510 Atherosclerotic heart disease of native coronary artery without angina pectoris: Secondary | ICD-10-CM | POA: Diagnosis not present

## 2017-05-25 DIAGNOSIS — I252 Old myocardial infarction: Secondary | ICD-10-CM | POA: Diagnosis not present

## 2017-05-25 DIAGNOSIS — Z803 Family history of malignant neoplasm of breast: Secondary | ICD-10-CM | POA: Diagnosis not present

## 2017-05-25 DIAGNOSIS — Z7984 Long term (current) use of oral hypoglycemic drugs: Secondary | ICD-10-CM | POA: Diagnosis not present

## 2017-05-25 DIAGNOSIS — I10 Essential (primary) hypertension: Secondary | ICD-10-CM | POA: Diagnosis not present

## 2017-05-25 DIAGNOSIS — Z7982 Long term (current) use of aspirin: Secondary | ICD-10-CM | POA: Diagnosis not present

## 2017-05-25 DIAGNOSIS — G8929 Other chronic pain: Secondary | ICD-10-CM | POA: Diagnosis not present

## 2017-07-27 ENCOUNTER — Other Ambulatory Visit: Payer: Self-pay | Admitting: Family Medicine

## 2017-08-04 ENCOUNTER — Ambulatory Visit (INDEPENDENT_AMBULATORY_CARE_PROVIDER_SITE_OTHER): Payer: Medicare HMO | Admitting: Unknown Physician Specialty

## 2017-08-04 ENCOUNTER — Encounter: Payer: Self-pay | Admitting: Unknown Physician Specialty

## 2017-08-04 VITALS — BP 102/65 | HR 68 | Temp 98.1°F | Ht 67.0 in | Wt 138.8 lb

## 2017-08-04 DIAGNOSIS — E1122 Type 2 diabetes mellitus with diabetic chronic kidney disease: Secondary | ICD-10-CM

## 2017-08-04 DIAGNOSIS — N182 Chronic kidney disease, stage 2 (mild): Secondary | ICD-10-CM | POA: Diagnosis not present

## 2017-08-04 DIAGNOSIS — E1142 Type 2 diabetes mellitus with diabetic polyneuropathy: Secondary | ICD-10-CM | POA: Diagnosis not present

## 2017-08-04 DIAGNOSIS — I129 Hypertensive chronic kidney disease with stage 1 through stage 4 chronic kidney disease, or unspecified chronic kidney disease: Secondary | ICD-10-CM | POA: Diagnosis not present

## 2017-08-04 LAB — BAYER DCA HB A1C WAIVED: HB A1C: 7.3 % — AB (ref <7.0)

## 2017-08-04 MED ORDER — NORTRIPTYLINE HCL 25 MG PO CAPS: 25.0000 mg | ORAL_CAPSULE | Freq: Every day | ORAL | 1 refills | Status: DC

## 2017-08-04 NOTE — Assessment & Plan Note (Signed)
Refill Nortriptiline

## 2017-08-04 NOTE — Assessment & Plan Note (Signed)
Hgb A1C 7.3% which is down form 7.7%.  Continue present treatment and recheck in 3 months

## 2017-08-04 NOTE — Progress Notes (Signed)
BP 102/65   Pulse 68   Temp 98.1 F (36.7 C) (Oral)   Ht  (1.702 m)   Wt 138 lb 12.8 oz (63 kg)   SpO2 98%   BMI 21.74 kg/m    Subjective:    Patient ID: Matthew Wolf, male    DOB: 11-Feb-1946, 72 y.o.   MRN: [id number]  HPI: Matthew Wolf is a 72 y.o. male  Chief Complaint  Patient presents with  . Diabetes  . Hyperlipidemia  . Hypertension   Diabetes: Using medications without difficulties No hypoglycemic episodes No hyperglycemic episodes Feet problems: has neuropathy Blood Sugars averaging:not checking eye exam within last year Last Hgb A1C: 7.7  Hypertension  Using medications without difficulty Average home BPs   Using medication without problems or lightheadedness No chest pain with exertion or shortness of breath No Edema  Elevated Cholesterol Using medications without problems No Muscle aches  Diet: Needs to cut out sweets Exercise:Works     Relevant past medical, surgical, family and social history reviewed and updated as indicated. Interim medical history since our last visit reviewed. Allergies and medications reviewed and updated.  Review of Systems  Per HPI unless specifically indicated above     Objective:    BP 102/65   Pulse 68   Temp 98.1 F (36.7 C) (Oral)   Ht  (1.702 m)   Wt 138 lb 12.8 oz (63 kg)   SpO2 98%   BMI 21.74 kg/m   Wt Readings from Last 3 Encounters:  08/04/17 138 lb 12.8 oz (63 kg)  05/04/17 141 lb 12.8 oz (64.3 kg)  03/04/17 139 lb 12.8 oz (63.4 kg)    Physical Exam  Constitutional: He is oriented to person, place, and time. He appears well-developed and well-nourished. No distress.  HENT:  Head: Normocephalic and atraumatic.  Eyes: Conjunctivae and lids are normal. Right eye exhibits no discharge. Left eye exhibits no discharge. No scleral icterus.  Neck: Normal range of motion. Neck supple. No JVD present. Carotid bruit is not present.  Cardiovascular: Normal rate, regular rhythm and  normal heart sounds.  Pulmonary/Chest: Effort normal and breath sounds normal. No respiratory distress.  Abdominal: Normal appearance. There is no splenomegaly or hepatomegaly.  Musculoskeletal: Normal range of motion.  Neurological: He is alert and oriented to person, place, and time.  Skin: Skin is warm, dry and intact. No rash noted. No pallor.  Psychiatric: He has a normal mood and affect. His behavior is normal. Judgment and thought content normal.    Results for orders placed or performed in visit on 05/04/17  Comprehensive metabolic panel  Result Value Ref Range   Glucose 189 (H) 65 - 99 mg/dL   BUN 14 8 - 27 mg/dL   Creatinine, Ser 4.09 0.76 - 1.27 mg/dL   GFR calc non Af Amer 85 >59 mL/min/1.73   GFR calc Af Amer 98 >59 mL/min/1.73   BUN/Creatinine Ratio 16 10 - 24   Sodium 137 134 - 144 mmol/L   Potassium 4.3 3.5 - 5.2 mmol/L   Chloride 100 96 - 106 mmol/L   CO2 20 20 - 29 mmol/L   Calcium 8.9 8.6 - 10.2 mg/dL   Total Protein 6.8 6.0 - 8.5 g/dL   Albumin 4.5 3.5 - 4.8 g/dL   Globulin, Total 2.3 1.5 - 4.5 g/dL   Albumin/Globulin Ratio 2.0 1.2 - 2.2   Bilirubin Total 0.4 0.0 - 1.2 mg/dL   Alkaline Phosphatase 139 (H) 39 -  117 IU/L   AST 13 0 - 40 IU/L   ALT 14 0 - 44 IU/L  Bayer DCA Hb A1c Waived  Result Value Ref Range   Bayer DCA Hb A1c Waived 7.7 (H) <7.0 %      Assessment & Plan:   Problem List Items Addressed This Visit      Unprioritized   Diabetic peripheral neuropathy (HCC)    Refill Nortriptiline      Relevant Medications   nortriptyline (PAMELOR) 25 MG capsule   Type 2 DM with CKD stage 2 and hypertension (HCC) - Primary    Hgb A1C 7.3% which is down form 7.7%.  Continue present treatment and recheck in 3 months      Relevant Orders   Comprehensive metabolic panel   Bayer DCA Hb A5W Waived       Follow up plan: Return in about 3 months (around 11/03/2017).

## 2017-08-05 ENCOUNTER — Encounter: Payer: Self-pay | Admitting: Unknown Physician Specialty

## 2017-08-05 LAB — COMPREHENSIVE METABOLIC PANEL
ALT: 17 IU/L (ref 0–44)
AST: 18 IU/L (ref 0–40)
Albumin/Globulin Ratio: 2.1 (ref 1.2–2.2)
Albumin: 4.5 g/dL (ref 3.5–4.8)
Alkaline Phosphatase: 131 IU/L — ABNORMAL HIGH (ref 39–117)
BILIRUBIN TOTAL: 0.6 mg/dL (ref 0.0–1.2)
BUN/Creatinine Ratio: 14 (ref 10–24)
BUN: 13 mg/dL (ref 8–27)
CALCIUM: 9.2 mg/dL (ref 8.6–10.2)
CHLORIDE: 100 mmol/L (ref 96–106)
CO2: 22 mmol/L (ref 20–29)
Creatinine, Ser: 0.9 mg/dL (ref 0.76–1.27)
GFR calc non Af Amer: 84 mL/min/{1.73_m2} (ref >59)
GFR, EST AFRICAN AMERICAN: 97 mL/min/{1.73_m2} (ref >59)
Globulin, Total: 2.1 g/dL (ref 1.5–4.5)
Glucose: 171 mg/dL — ABNORMAL HIGH (ref 65–99)
Potassium: 4.2 mmol/L (ref 3.5–5.2)
Sodium: 139 mmol/L (ref 134–144)
TOTAL PROTEIN: 6.6 g/dL (ref 6.0–8.5)

## 2017-08-25 DIAGNOSIS — H401131 Primary open-angle glaucoma, bilateral, mild stage: Secondary | ICD-10-CM | POA: Diagnosis not present

## 2017-08-25 DIAGNOSIS — H353131 Nonexudative age-related macular degeneration, bilateral, early dry stage: Secondary | ICD-10-CM | POA: Diagnosis not present

## 2017-08-25 LAB — HM DIABETES EYE EXAM

## 2017-10-22 ENCOUNTER — Other Ambulatory Visit: Payer: Self-pay | Admitting: Unknown Physician Specialty

## 2017-11-03 ENCOUNTER — Ambulatory Visit: Payer: Medicare HMO | Admitting: Unknown Physician Specialty

## 2018-01-13 ENCOUNTER — Encounter: Payer: Self-pay | Admitting: Nurse Practitioner

## 2018-01-13 ENCOUNTER — Ambulatory Visit (INDEPENDENT_AMBULATORY_CARE_PROVIDER_SITE_OTHER): Payer: Medicare HMO | Admitting: Nurse Practitioner

## 2018-01-13 ENCOUNTER — Other Ambulatory Visit: Payer: Self-pay

## 2018-01-13 VITALS — BP 116/69 | HR 75 | Temp 97.5°F | Ht 67.0 in | Wt 135.0 lb

## 2018-01-13 DIAGNOSIS — I129 Hypertensive chronic kidney disease with stage 1 through stage 4 chronic kidney disease, or unspecified chronic kidney disease: Secondary | ICD-10-CM | POA: Diagnosis not present

## 2018-01-13 DIAGNOSIS — E785 Hyperlipidemia, unspecified: Secondary | ICD-10-CM

## 2018-01-13 DIAGNOSIS — E1142 Type 2 diabetes mellitus with diabetic polyneuropathy: Secondary | ICD-10-CM | POA: Diagnosis not present

## 2018-01-13 DIAGNOSIS — Z23 Encounter for immunization: Secondary | ICD-10-CM | POA: Diagnosis not present

## 2018-01-13 DIAGNOSIS — E1122 Type 2 diabetes mellitus with diabetic chronic kidney disease: Secondary | ICD-10-CM | POA: Diagnosis not present

## 2018-01-13 DIAGNOSIS — N182 Chronic kidney disease, stage 2 (mild): Secondary | ICD-10-CM

## 2018-01-13 LAB — BAYER DCA HB A1C WAIVED: HB A1C (BAYER DCA - WAIVED): 7.7 % — ABNORMAL HIGH (ref <7.0)

## 2018-01-13 MED ORDER — GLIPIZIDE ER 10 MG PO TB24: 10.0000 mg | ORAL_TABLET | Freq: Every day | ORAL | 2 refills | Status: DC

## 2018-01-13 MED ORDER — NORTRIPTYLINE HCL 25 MG PO CAPS: 25.0000 mg | ORAL_CAPSULE | Freq: Two times a day (BID) | ORAL | 2 refills | Status: DC

## 2018-01-13 NOTE — Assessment & Plan Note (Signed)
Stable at this time.  Lipid panel today.  Continue current medication regimen.

## 2018-01-13 NOTE — Assessment & Plan Note (Addendum)
HgbA1C previous 7.3% and today 7.7%.  Will encourage patient to check FSBS at home and adjust meds next visit if continued elevation.  Continue current medication regimen and recheck in three months.

## 2018-01-13 NOTE — Progress Notes (Signed)
BP 116/69   Pulse 75   Temp (!) 97.5 F (36.4 C) (Oral)   Ht 5\' 7"  (1.702 m)   Wt 135 lb (61.2 kg)   SpO2 97%   BMI 21.14 kg/m    Subjective:    Patient ID: Matthew Wolf, male    DOB: 09/18/1945, 72 y.o.   MRN: [id number]  HPI: Matthew Wolf is a 72 y.o. male presents for follow-up T2DM and HTN/HLD  Chief Complaint  Patient presents with  . Diabetes    glipizide refill  . Hypertension   DIABETES WITH NEUROPATHY Hypoglycemic episodes:no Polydipsia/polyuria: no Visual disturbance: no Chest pain: no Paresthesias: yes Glucose Monitoring: no  Accucheck frequency: Not Checking  Fasting glucose:  Post prandial:  Evening:  Before meals: Taking Insulin?: no  Long acting insulin:  Short acting insulin: Blood Pressure Monitoring: weekly Retinal Examination: Up to Date Foot Exam: Up to Date Diabetic Education: Completed Pneumovax: Up to Date Influenza: Up to Date, today Aspirin: yes Encouraged follow-up with podiatry.  He reports he was told to get diabetic shoes a year ago, but never received them.  States that he take Nortriptyline at home, which offers relief in evenings.  He states "I'm gonna be honest with you, I have been taking it in the morning at times too and I find it helps better taking it two times a day".  Has tried Gabapentin in past with no relief.  Discussed increasing Nortriptyline to BID with him and educated on risks/benefits, he wishes to change dosing at this time.    HYPERTENSION / HYPERLIPIDEMIA Satisfied with current treatment? yes Duration of hypertension: chronic BP monitoring frequency: weekly BP range:  BP medication side effects: no Past BP meds: followed by [facility] Duration of hyperlipidemia: chronic Cholesterol medication side effects: no Cholesterol supplements: none Past cholesterol medications: none Medication compliance: excellent compliance Aspirin: yes Recent stressors: no Recurrent headaches: no Visual changes:  no Palpitations: no Dyspnea: no Chest pain: no Lower extremity edema: no Dizzy/lightheaded: no Is followed by [facility] cardiology and has upcoming appointment in the next month.  Denies any current issues with medication regimen.  He reports he "knows how to" take medications to ensure no side effects or low BP.    Relevant past medical, surgical, family and social history reviewed and updated as indicated. Interim medical history since our last visit reviewed. Allergies and medications reviewed and updated.  Review of Systems  Constitutional: Negative for activity change, fatigue and fever.  Respiratory: Negative for cough, chest tightness and shortness of breath.   Cardiovascular: Negative for chest pain, palpitations and leg swelling.  Gastrointestinal: Negative for abdominal distention and abdominal pain.  Musculoskeletal: Negative for gait problem and myalgias.  Neurological: Positive for numbness. Negative for dizziness, weakness and light-headedness.       Paresthesia in bilateral feet      Per HPI unless specifically indicated above     Objective:    BP 116/69   Pulse 75   Temp (!) 97.5 F (36.4 C) (Oral)   Ht 5\' 7"  (1.702 m)   Wt 135 lb (61.2 kg)   SpO2 97%   BMI 21.14 kg/m   Wt Readings from Last 3 Encounters:  01/13/18 135 lb (61.2 kg)  08/04/17 138 lb 12.8 oz (63 kg)  05/04/17 141 lb 12.8 oz (64.3 kg)    Physical Exam  Constitutional: He is oriented to person, place, and time. He appears well-developed and well-nourished.  HENT:  Head: Normocephalic and  atraumatic.  Eyes: Pupils are equal, round, and reactive to light.  Cardiovascular: Normal rate, regular rhythm and normal heart sounds.  Pulmonary/Chest: Effort normal and breath sounds normal.  Abdominal: Soft. Bowel sounds are normal.  Neurological: He is alert and oriented to person, place, and time.  Skin: Skin is warm and dry.  Psychiatric: He has a normal mood and affect. His behavior is normal.     Diabetic Foot Exam - Simple   Simple Foot Form Visual Inspection No deformities, no ulcerations, no other skin breakdown bilaterally:  Yes Sensation Testing See comments:  Yes Pulse Check See comments:  Yes Comments Diminished pulses bilateral feet, T & D.  No edema.  No skin breakdown.  Small callus to bottom left heel, intact.  Toenails intact, thick.  Decreased sensation on monofilament exam to both feet, R>L.       Results for orders placed or performed in visit on 09/11/17  HM DIABETES EYE EXAM  Result Value Ref Range   HM Diabetic Eye Exam No Retinopathy No Retinopathy      Assessment & Plan:   Problem List Items Addressed This Visit      Cardiovascular and Mediastinum   Type 2 DM with CKD stage 2 and hypertension (HCC)    HgbA1C previous 7.3% and today 7.7%.  Will encourage patient to check FSBS at home and adjust meds next visit if continued elevation.  Continue current medication regimen and recheck in three months.      Relevant Medications   glipiZIDE (GLUCOTROL XL) 10 MG 24 hr tablet   Other Relevant Orders   Bayer DCA Hb A1c Waived (STAT)   Hepatitis C Antibody     Endocrine   Diabetic peripheral neuropathy (HCC)    Reports relief with increase of his Nortriptyline to 25MG  twice a day, which he has been doing on own.  Will increase dose to BID at this time.  Has used Gabapentin without relief. Follow-up 3 months.      Relevant Medications   nortriptyline (PAMELOR) 25 MG capsule   glipiZIDE (GLUCOTROL XL) 10 MG 24 hr tablet     Other   Hyperlipidemia    Stable at this time.  Lipid panel today.  Continue current medication regimen.      Relevant Orders   Lipid Profile    Other Visit Diagnoses    Flu vaccine need    -  Primary   Relevant Orders   Flu vaccine HIGH DOSE PF (Completed)       Follow up plan: Return in about 3 months (around 04/15/2018), or if symptoms worsen or fail to improve, for for T2DM and A1C.

## 2018-01-13 NOTE — Assessment & Plan Note (Signed)
Reports relief with increase of his Nortriptyline to 25MG  twice a day, which he has been doing on own.  Will increase dose to BID at this time.  Has used Gabapentin without relief. Follow-up 3 months.

## 2018-01-13 NOTE — Patient Instructions (Addendum)
Peripheral Neuropathy Peripheral neuropathy is a type of nerve damage. It affects nerves that carry signals between the spinal cord and other parts of the body. These are called peripheral nerves. With peripheral neuropathy, one nerve or a group of nerves may be damaged. What are the causes? Many things can damage peripheral nerves. For some people with peripheral neuropathy, the cause is unknown. Some causes include:  Diabetes. This is the most common cause of peripheral neuropathy.  Injury to a nerve.  Pressure or stress on a nerve that lasts a long time.  Too little vitamin B. Alcoholism can lead to this.  Infections.  Autoimmune diseases, such as multiple sclerosis and systemic lupus erythematosus.  Inherited nerve diseases.  Some medicines, such as cancer drugs.  Toxic substances, such as lead and mercury.  Too little blood flowing to the legs.  Kidney disease.  Thyroid disease.  What are the signs or symptoms? Different people have different symptoms. The symptoms you have will depend on which of your nerves is damaged. Common symptoms include:  Loss of feeling (numbness) in the feet and hands.  Tingling in the feet and hands.  Pain that burns.  Very sensitive skin.  Weakness.  Not being able to move a part of the body (paralysis).  Muscle twitching.  Clumsiness or poor coordination.  Loss of balance.  Not being able to control your bladder.  Feeling dizzy.  Sexual problems.  How is this diagnosed? Peripheral neuropathy is a symptom, not a disease. Finding the cause of peripheral neuropathy can be hard. To figure that out, your health care provider will take a medical history and do a physical exam. A neurological exam will also be done. This involves checking things affected by your brain, spinal cord, and nerves (nervous system). For example, your health care provider will check your reflexes, how you move, and what you can feel. Other types of tests  may also be ordered, such as:  Blood tests.  A test of the fluid in your spinal cord.  Imaging tests, such as CT scans or an MRI.  Electromyography (EMG). This test checks the nerves that control muscles.  Nerve conduction velocity tests. These tests check how fast messages pass through your nerves.  Nerve biopsy. A small piece of nerve is removed. It is then checked under a microscope.  How is this treated?  Medicine is often used to treat peripheral neuropathy. Medicines may include: ? Pain-relieving medicines. Prescription or over-the-counter medicine may be suggested. ? Antiseizure medicine. This may be used for pain. ? Antidepressants. These also may help ease pain from neuropathy. ? Lidocaine. This is a numbing medicine. You might wear a patch or be given a shot. ? Mexiletine. This medicine is typically used to help control irregular heart rhythms.  Surgery. Surgery may be needed to relieve pressure on a nerve or to destroy a nerve that is causing pain.  Physical therapy to help movement.  Assistive devices to help movement. Follow these instructions at home:  Only take over-the-counter or prescription medicines as directed by your health care provider. Follow the instructions carefully for any given medicines. Do not take any other medicines without first getting approval from your health care provider.  If you have diabetes, work closely with your health care provider to keep your blood sugar under control.  If you have numbness in your feet: ? Check every day for signs of injury or infection. Watch for redness, warmth, and swelling. ? Wear padded socks and comfortable   shoes. These help protect your feet.  Do not do things that put pressure on your damaged nerve.  Do not smoke. Smoking keeps blood from getting to damaged nerves.  Avoid or limit alcohol. Too much alcohol can cause a lack of B vitamins. These vitamins are needed for healthy nerves.  Develop a good  support system. Coping with peripheral neuropathy can be stressful. Talk to a mental health specialist or join a support group if you are struggling.  Follow up with your health care provider as directed. Contact a health care provider if:  You have new signs or symptoms of peripheral neuropathy.  You are struggling emotionally from dealing with peripheral neuropathy.  You have a fever. Get help right away if:  You have an injury or infection that is not healing.  You feel very dizzy or begin vomiting.  You have chest pain.  You have trouble breathing. This information is not intended to replace advice given to you by your health care provider. Make sure you discuss any questions you have with your health care provider. Document Released: 03/14/2002 Document Revised: 08/30/2015 Document Reviewed: 11/29/2012 Elsevier Interactive Patient Education  2017 Else[employer] Nortriptyline capsules What is this medicine? NORTRIPTYLINE (nor TRIP ti leen) is used to treat depression. This medicine may be used for other purposes; ask your health care provider or pharmacist if you have questions. COMMON BRAND NAME(S): Aventyl, Pamelor What should I tell my health care provider before I take this medicine? They need to know if you have any of these conditions: -an alcohol problem -bipolar disorder or schizophrenia -difficulty passing urine, prostate trouble -glaucoma -heart disease or recent heart attack -liver disease -over active thyroid -seizures -thoughts or plans of suicide or a previous suicide attempt or family history of suicide attempt -an unusual or allergic reaction to nortriptyline, other medicines, foods, dyes, or preservatives -pregnant or trying to get pregnant -breast-feeding How should I use this medicine? Take this medicine by mouth with a glass of water. Follow the directions on the prescription label. Take your doses at regular intervals. Do not take it more often than  directed. Do not stop taking this medicine suddenly except upon the advice of your doctor. Stopping this medicine too quickly may cause serious side effects or your condition may worsen. A special MedGuide will be given to you by the pharmacist with each prescription and refill. Be sure to read this information carefully each time. Talk to your pediatrician regarding the use of this medicine in children. Special care may be needed. Overdosage: If you think you have taken too much of this medicine contact a poison control center or emergency room at once. NOTE: This medicine is only for you. Do not share this medicine with others. What if I miss a dose? If you miss a dose, take it as soon as you can. If it is almost time for your next dose, take only that dose. Do not take double or extra doses. What may interact with this medicine? Do not take this medicine with any of the following medications: -arsenic trioxide -certain medicines medicines for irregular heart beat -cisapride -halofantrine -linezolid -MAOIs like Carbex, Eldepryl, Marplan, Nardil, and Parnate -methylene blue (injected into a vein) -other medicines for mental depression -phenothiazines like perphenazine, thioridazine and chlorpromazine -pimozide -probucol -procarbazine -sparfloxacin -St. John's Wort -ziprasidone This medicine may also interact with any of the following medications: -atropine and related drugs like hyoscyamine, scopolamine, tolterodine and others -barbiturate medicines for inducing sleep or treating  seizures, such as phenobarbital -cimetidine -medicines for diabetes -medicines for seizures like carbamazepine or phenytoin -reserpine -thyroid medicine This list may not describe all possible interactions. Give your health care provider a list of all the medicines, herbs, non-prescription drugs, or dietary supplements you use. Also tell them if you smoke, drink alcohol, or use illegal drugs. Some items may  interact with your medicine. What should I watch for while using this medicine? Tell your doctor if your symptoms do not get better or if they get worse. Visit your doctor or health care professional for regular checks on your progress. Because it may take several weeks to see the full effects of this medicine, it is important to continue your treatment as prescribed by your doctor. Patients and their families should watch out for new or worsening thoughts of suicide or depression. Also watch out for sudden changes in feelings such as feeling anxious, agitated, panicky, irritable, hostile, aggressive, impulsive, severely restless, overly excited and hyperactive, or not being able to sleep. If this happens, especially at the beginning of treatment or after a change in dose, call your health care professional. Bonita Quin may get drowsy or dizzy. Do not drive, use machinery, or do anything that needs mental alertness until you know how this medicine affects you. Do not stand or sit up quickly, especially if you are an older patient. This reduces the risk of dizzy or fainting spells. Alcohol may interfere with the effect of this medicine. Avoid alcoholic drinks. Do not treat yourself for coughs, colds, or allergies without asking your doctor or health care professional for advice. Some ingredients can increase possible side effects. Your mouth may get dry. Chewing sugarless gum or sucking hard candy, and drinking plenty of water may help. Contact your doctor if the problem does not go away or is severe. This medicine may cause dry eyes and blurred vision. If you wear contact lenses you may feel some discomfort. Lubricating drops may help. See your eye doctor if the problem does not go away or is severe. This medicine can cause constipation. Try to have a bowel movement at least every 2 to 3 days. If you do not have a bowel movement for 3 days, call your doctor or health care professional. This medicine can make you  more sensitive to the sun. Keep out of the sun. If you cannot avoid being in the sun, wear protective clothing and use sunscreen. Do not use sun lamps or tanning beds/booths. What side effects may I notice from receiving this medicine? Side effects that you should report to your doctor or health care professional as soon as possible: -allergic reactions like skin rash, itching or hives, swelling of the face, lips, or tongue -anxious -breathing problems -changes in vision -confusion -elevated mood, decreased need for sleep, racing thoughts, impulsive behavior -eye pain -fast, irregular heartbeat -feeling faint or lightheaded, falls -feeling agitated, angry, or irritable -fever with increased sweating -hallucination, loss of contact with reality -seizures -stiff muscles -suicidal thoughts or other mood changes -tingling, pain, or numbness in the feet or hands -trouble passing urine or change in the amount of urine -trouble sleeping -unusually weak or tired -vomiting -yellowing of the eyes or skin Side effects that usually do not require medical attention (report to your doctor or health care professional if they continue or are bothersome): -change in sex drive or performance -change in appetite or weight -constipation -dizziness -dry mouth -nausea -tired -tremors -upset stomach This list may not describe all possible side  effects. Call your doctor for medical advice about side effects. You may report side effects to FDA at 1-800-FDA-1088. Where should I keep my medicine? Keep out of the reach of children. Store at room temperature between 15 and 30 degrees C (59 and 86 degrees F). Keep container tightly closed. Throw away any unused medicine after the expiration date. NOTE: This sheet is a summary. It may not cover all possible information. If you have questions about this medicine, talk to your doctor, pharmacist, or health care provider.  2018 Elsevier/Gold Standard  (2015-08-24 12:53:08)

## 2018-01-14 LAB — LIPID PANEL
CHOL/HDL RATIO: 4.2 ratio (ref 0.0–5.0)
CHOLESTEROL TOTAL: 134 mg/dL (ref 100–199)
HDL: 32 mg/dL — AB (ref >39)
LDL Calculated: 69 mg/dL (ref 0–99)
TRIGLYCERIDES: 163 mg/dL — AB (ref 0–149)
VLDL Cholesterol Cal: 33 mg/dL (ref 5–40)

## 2018-01-14 LAB — HEPATITIS C ANTIBODY: Hep C Virus Ab: <0.1 s/co ratio (ref 0.0–0.9)

## 2018-01-25 ENCOUNTER — Ambulatory Visit (INDEPENDENT_AMBULATORY_CARE_PROVIDER_SITE_OTHER): Payer: Medicare HMO

## 2018-01-25 VITALS — BP 100/62 | HR 65 | Temp 97.6°F | Resp 16 | Ht 66.0 in | Wt 136.4 lb

## 2018-01-25 DIAGNOSIS — Z0000 Encounter for general adult medical examination without abnormal findings: Secondary | ICD-10-CM

## 2018-01-25 NOTE — Progress Notes (Signed)
Subjective:   Matthew Wolf is a 72 y.o. male who presents for Medicare Annual/Subsequent preventive examination.  Review of Systems:   Cardiac Risk Factors include: advanced age (>70men, >57 women);dyslipidemia;hypertension;diabetes mellitus;male gender     Objective:    Vitals: BP 100/62 (BP Location: Left Arm, Patient Position: Sitting, Cuff Size: Normal)   Pulse 65   Temp 97.6 F (36.4 C) (Temporal)   Resp 16   Ht 5\' 6"  (1.676 m)   Wt 136 lb 6.4 oz (61.9 kg)   BMI 22.02 kg/m   Body mass index is 22.02 kg/m.  Advanced Directives 01/25/2018 01/22/2017 02/25/2016  Does Patient Have a Medical Advance Directive? Yes Yes Yes  Type of Estate agent of [place];Living will Healthcare Power of [place];Living will Healthcare Power of [place];Living will  Copy of Healthcare Power of Attorney in Chart? No - copy requested No - copy requested -    Tobacco Social History   Tobacco Use  Smoking Status Former Smoker  . Types: Cigarettes  . Last attempt to quit: 04/07/1984  . Years since quitting: 33.8  Smokeless Tobacco Never Used     Counseling given: Not Answered   Clinical Intake:  Pre-visit preparation completed: Yes  Pain : No/denies pain      Nutrition Risk Assessment:  Has the patient had any N/V/D within the last 2 months?  No  Does the patient have any non-healing wounds?  No  Has the patient had any unintentional weight loss or weight gain?  No   Diabetes:  Is the patient diabetic?  Yes  If diabetic, was a CBG obtained today?  No  Did the patient bring in their glucometer from home?  No  How often do you monitor your CBG's? Does not monitor at home, on oral medications   Financial Strains and Diabetes Management:  Are you having any financial strains with the device, your supplies or your medication? No .   Would the patient like to be referred to a Nutritionist or for Diabetic Management?  No   Diabetic Exams:  Diabetic  Eye Exam: Completed 08/25/2017.   Diabetic Foot Exam:  Pt has been advised about the importance in completing this exam. Pt is scheduled for diabetic foot exam on 04/16/2018 with Jolene Cannady,NP.  How often do you need to have someone help you when you read instructions, pamphlets, or other written materials from your doctor or pharmacy?: 1 - Never What is the last grade level you completed in school?: 12th grade  Interpreter Needed?: No  Information entered by :: Kimmarie Pascale,LPN  Past Medical History:  Diagnosis Date  . BPH (benign prostatic hypertrophy)   . CKD (chronic kidney disease) stage 3, GFR 30-59 ml/min (HCC)   . Diabetes (HCC)   . Diabetic peripheral neuropathy (HCC)   . HBP (high blood pressure)   . Heart trouble   . Hyperlipidemia   . Hypertensive kidney disease   . MI (myocardial infarction) (HCC) 1991, 1996   Past Surgical History:  Procedure Laterality Date  . ANGIOPLASTY     x 2  . CARDIAC SURGERY    . CATARACT EXTRACTION, BILATERAL  06/2015  . CORONARY ARTERY BYPASS GRAFT  1991  . HERNIA REPAIR     Family History  Problem Relation Age of Onset  . Diabetes Mother   . Heart disease Mother   . Hypertension Mother   . Emphysema Father   . Lung cancer Father   . Breast cancer Sister   .  Heart disease Maternal Aunt   . Stroke Neg Hx    Social History   Socioeconomic History  . Marital status: Married    Spouse name: Not on file  . Number of children: Not on file  . Years of education: Not on file  . Highest education level: 12th grade  Occupational History  . Occupation: Data processing manager    Comment: fulltime   Social Needs  . Financial resource strain: Not hard at all  . Food insecurity:    Worry: Never true    Inability: Never true  . Transportation needs:    Medical: No    Non-medical: No  Tobacco Use  . Smoking status: Former Smoker    Types: Cigarettes    Last attempt to quit: 04/07/1984    Years since quitting: 33.8  . Smokeless tobacco:  Never Used  Substance and Sexual Activity  . Alcohol use: No  . Drug use: No  . Sexual activity: Yes  Lifestyle  . Physical activity:    Days per week: 0 days    Minutes per session: 0 min  . Stress: Not at all  Relationships  . Social connections:    Talks on phone: More than three times a week    Gets together: More than three times a week    Attends religious service: More than 4 times per year    Active member of club or organization: No    Attends meetings of clubs or organizations: Never    Relationship status: Married  Other Topics Concern  . Not on file  Social History Narrative  . Not on file    Outpatient Encounter Medications as of 01/25/2018  Medication Sig  . aspirin EC 325 MG tablet Take 325 mg by mouth daily.  [name] atenolol (TENORMIN) 50 MG tablet Take 50 mg by mouth.   [name] atorvastatin (LIPITOR) 80 MG tablet Take 80 mg by mouth at bedtime.   [name] glipiZIDE (GLUCOTROL XL) 10 MG 24 hr tablet Take 1 tablet (10 mg total) by mouth daily with breakfast.  . nortriptyline (PAMELOR) 25 MG capsule Take 1 capsule (25 mg total) by mouth 2 (two) times daily.  . ramipril (ALTACE) 5 MG capsule TAKE 1 CAPSULE (5 MG TOTAL) BY MOUTH EVERY MORNING.   No facility-administered encounter medications on file as of 01/25/2018.     Activities of Daily Living In your present state of health, do you have any difficulty performing the following activities: 01/25/2018  Hearing? N  Comment declines hearing aids   Vision? N  Comment wears glasses   Difficulty concentrating or making decisions? N  Walking or climbing stairs? N  Dressing or bathing? N  Doing errands, shopping? N  Preparing Food and eating ? N  Comment partial dentures   Using the Toilet? N  In the past six months, have you accidently leaked urine? N  Do you have problems with loss of bowel control? N  Managing your Medications? N  Managing your Finances? N  Housekeeping or managing your Housekeeping? N  Some recent data  might be hidden    Patient Care Team: Marjie Skiff, NP as PCP - General (Nurse Practitioner) Raynelle Jan, MD as Referring Physician (Cardiology)   Assessment:   This is a routine wellness examination for Matthew Wolf.  Exercise Activities and Dietary recommendations Current Exercise Habits: The patient does not participate in regular exercise at present, Exercise limited by: None identified  Goals    . Increase water intake  Recommend drinking at least 6-8 glasses of water a day       Fall Risk Fall Risk  01/25/2018 01/22/2017 02/25/2016 03/12/2015  Falls in the past year? No No No No   FALL RISK PREVENTION PERTAINING TO THE HOME:  Any stairs in or around the home WITH handrails? Yes  Home free of loose throw rugs in walkways, pet beds, electrical cords, etc? Yes  Adequate lighting in your home to reduce risk of falls? Yes   ASSISTIVE DEVICES UTILIZED TO PREVENT FALLS:  Life alert? No  Use of a cane, walker or w/c? No  Grab bars in the bathroom? No  Shower chair or bench in shower? No  Elevated toilet seat or a handicapped toilet? No   DME ORDERS:  DME order needed?  No   TIMED UP AND GO:  Was the test performed? Yes .  Length of time to ambulate 10 feet: 8 sec.   GAIT:  Appearance of gait: Gait stead-fast and without the use of an assistive device.  Education: Fall risk prevention has been discussed.  Intervention(s) required? No    Depression Screen PHQ 2/9 Scores 01/25/2018 01/22/2017 02/25/2016 03/12/2015  PHQ - 2 Score 0 0 0 0    Cognitive Function     6CIT Screen 01/25/2018 01/22/2017  What Year? 0 points 0 points  What month? 0 points 0 points  What time? 0 points 0 points  Count back from 20 0 points 0 points  Months in reverse 0 points 0 points  Repeat phrase 0 points 0 points  Total Score 0 0    Immunization History  Administered Date(s) Administered  . Influenza, High Dose Seasonal PF 12/25/2015, 01/22/2017, 01/13/2018  .  Influenza,inj,Quad PF,6+ Mos 03/16/2015  . Pneumococcal Conjugate-13 09/27/2013  . Pneumococcal Polysaccharide-23 12/21/2006, 12/22/2011  . Zoster 12/21/2006    Qualifies for Shingles Vaccine? Yes  Zostavax completed 12/21/2006. Due for Shingrix. Education has been provided regarding the importance of this vaccine. Pt has been advised to call insurance company to determine out of pocket expense. Advised may also receive vaccine at local pharmacy or Health Dept. Verbalized acceptance and understanding.  Tdap: Although this vaccine is not a covered service during a Wellness Exam, does the patient still wish to receive this vaccine today?  No .  Education has been provided regarding the importance of this vaccine. Advised may receive this vaccine at local pharmacy or Health Dept. Aware to provide a copy of the vaccination record if obtained from local pharmacy or Health Dept. Verbalized acceptance and understanding.  Flu Vaccine: completed 01/13/2018  Pneumococcal Vaccine: completed series   Screening Tests Health Maintenance  Topic Date Due  . FOOT EXAM  12/24/2016  . TETANUS/TDAP  01/14/2019 (Originally 07/01/1964)  . HEMOGLOBIN A1C  07/15/2018  . COLONOSCOPY  08/05/2018  . OPHTHALMOLOGY EXAM  08/26/2018  . INFLUENZA VACCINE  Completed  . Hepatitis C Screening  Completed  . PNA vac Low Risk Adult  Completed   Cancer Screenings:  Colorectal Screening: Completed 08/04/2013. Repeat every 5 years.  Lung Cancer Screening: (Low Dose CT Chest recommended if Age 47-80 years, 30 pack-year currently smoking OR have quit w/in 15years.) does not qualify.    Additional Screening:  Hepatitis C Screening: does not qualify   Dental Screening: Recommended annual dental exams for proper oral hygiene  Community Resource Referral:  CRR required this visit?  No       Plan:    I have personally reviewed and addressed the  Medicare Annual Wellness questionnaire and have noted the following in the  patient's chart:  A. Medical and social history B. Use of alcohol, tobacco or illicit drugs  C. Current medications and supplements D. Functional ability and status E.  Nutritional status F.  Physical activity G. Advance directives H. List of other physicians I.  Hospitalizations, surgeries, and ER visits in previous 12 months J.  Vitals K. Screenings such as hearing and vision if needed, cognitive and depression L. Referrals and appointments   In addition, I have reviewed and discussed with patient certain preventive protocols, quality metrics, and best practice recommendations. A written personalized care plan for preventive services as well as general preventive health recommendations were provided to patient.   Signed,  Marin Roberts, LPN Nurse Health Advisor   Nurse Notes:none

## 2018-01-25 NOTE — Patient Instructions (Signed)
Matthew Wolf , Thank you for taking time to come for your Medicare Wellness Visit. I appreciate your ongoing commitment to your health goals. Please review the following plan we discussed and let me know if I can assist you in the future.   Screening recommendations/referrals: Colonoscopy: completed 08/04/2013 Recommended yearly ophthalmology/optometry visit for glaucoma screening and checkup Recommended yearly dental visit for hygiene and checkup  Vaccinations: Influenza vaccine: completed  Pneumococcal vaccine: completed series  Tdap vaccine: up to date Shingles vaccine: shingrix eligible, check with your insurance company for coverage   Advanced directives: Please bring a copy of your health care power of attorney and living will to the office at your convenience.  Conditions/risks identified: recommend drinking at least 6-8 glasses of water a day   Next appointment: Follow up in one year for your annual wellness exam.   Preventive Care 65 Years and Older, Male Preventive care refers to lifestyle choices and visits with your health care provider that can promote health and wellness. What does preventive care include?  A yearly physical exam. This is also called an annual well check.  Dental exams once or twice a year.  Routine eye exams. Ask your health care provider how often you should have your eyes checked.  Personal lifestyle choices, including:  Daily care of your teeth and gums.  Regular physical activity.  Eating a healthy diet.  Avoiding tobacco and drug use.  Limiting alcohol use.  Practicing safe sex.  Taking low doses of aspirin every day.  Taking vitamin and mineral supplements as recommended by your health care provider. What happens during an annual well check? The services and screenings done by your health care provider during your annual well check will depend on your age, overall health, lifestyle risk factors, and family history of  disease. Counseling  Your health care provider may ask you questions about your:  Alcohol use.  Tobacco use.  Drug use.  Emotional well-being.  Home and relationship well-being.  Sexual activity.  Eating habits.  History of falls.  Memory and ability to understand (cognition).  Work and work Astronomer. Screening  You may have the following tests or measurements:  Height, weight, and BMI.  Blood pressure.  Lipid and cholesterol levels. These may be checked every 5 years, or more frequently if you are over 50 years old.  Skin check.  Lung cancer screening. You may have this screening every year starting at age 20 if you have a 30-pack-year history of smoking and currently smoke or have quit within the past 15 years.  Fecal occult blood test (FOBT) of the stool. You may have this test every year starting at age 47.  Flexible sigmoidoscopy or colonoscopy. You may have a sigmoidoscopy every 5 years or a colonoscopy every 10 years starting at age 44.  Prostate cancer screening. Recommendations will vary depending on your family history and other risks.  Hepatitis C blood test.  Hepatitis B blood test.  Sexually transmitted disease (STD) testing.  Diabetes screening. This is done by checking your blood sugar (glucose) after you have not eaten for a while (fasting). You may have this done every 1-3 years.  Abdominal aortic aneurysm (AAA) screening. You may need this if you are a current or former smoker.  Osteoporosis. You may be screened starting at age 31 if you are at high risk. Talk with your health care provider about your test results, treatment options, and if necessary, the need for more tests. Vaccines  Your health  care provider may recommend certain vaccines, such as:  Influenza vaccine. This is recommended every year.  Tetanus, diphtheria, and acellular pertussis (Tdap, Td) vaccine. You may need a Td booster every 10 years.  Zoster vaccine. You may  need this after age 8.  Pneumococcal 13-valent conjugate (PCV13) vaccine. One dose is recommended after age 66.  Pneumococcal polysaccharide (PPSV23) vaccine. One dose is recommended after age 40. Talk to your health care provider about which screenings and vaccines you need and how often you need them. This information is not intended to replace advice given to you by your health care provider. Make sure you discuss any questions you have with your health care provider. Document Released: 04/20/2015 Document Revised: 12/12/2015 Document Reviewed: 01/23/2015 Elsevier Interactive Patient Education  2017 [place] Prevention in the Home Falls can cause injuries. They can happen to people of all ages. There are many things you can do to make your home safe and to help prevent falls. What can I do on the outside of my home?  Regularly fix the edges of walkways and driveways and fix any cracks.  Remove anything that might make you trip as you walk through a door, such as a raised step or threshold.  Trim any bushes or trees on the path to your home.  Use bright outdoor lighting.  Clear any walking paths of anything that might make someone trip, such as rocks or tools.  Regularly check to see if handrails are loose or broken. Make sure that both sides of any steps have handrails.  Any raised decks and porches should have guardrails on the edges.  Have any leaves, snow, or ice cleared regularly.  Use sand or salt on walking paths during winter.  Clean up any spills in your garage right away. This includes oil or grease spills. What can I do in the bathroom?  Use night lights.  Install grab bars by the toilet and in the tub and shower. Do not use towel bars as grab bars.  Use non-skid mats or decals in the tub or shower.  If you need to sit down in the shower, use a plastic, non-slip stool.  Keep the floor dry. Clean up any water that spills on the floor as soon as it  happens.  Remove soap buildup in the tub or shower regularly.  Attach bath mats securely with double-sided non-slip rug tape.  Do not have throw rugs and other things on the floor that can make you trip. What can I do in the bedroom?  Use night lights.  Make sure that you have a light by your bed that is easy to reach.  Do not use any sheets or blankets that are too big for your bed. They should not hang down onto the floor.  Have a firm chair that has side arms. You can use this for support while you get dressed.  Do not have throw rugs and other things on the floor that can make you trip. What can I do in the kitchen?  Clean up any spills right away.  Avoid walking on wet floors.  Keep items that you use a lot in easy-to-reach places.  If you need to reach something above you, use a strong step stool that has a grab bar.  Keep electrical cords out of the way.  Do not use floor polish or wax that makes floors slippery. If you must use wax, use non-skid floor wax.  Do not have  throw rugs and other things on the floor that can make you trip. What can I do with my stairs?  Do not leave any items on the stairs.  Make sure that there are handrails on both sides of the stairs and use them. Fix handrails that are broken or loose. Make sure that handrails are as long as the stairways.  Check any carpeting to make sure that it is firmly attached to the stairs. Fix any carpet that is loose or worn.  Avoid having throw rugs at the top or bottom of the stairs. If you do have throw rugs, attach them to the floor with carpet tape.  Make sure that you have a light switch at the top of the stairs and the bottom of the stairs. If you do not have them, ask someone to add them for you. What else can I do to help prevent falls?  Wear shoes that:  Do not have high heels.  Have rubber bottoms.  Are comfortable and fit you well.  Are closed at the toe. Do not wear sandals.  If you  use a stepladder:  Make sure that it is fully opened. Do not climb a closed stepladder.  Make sure that both sides of the stepladder are locked into place.  Ask someone to hold it for you, if possible.  Clearly mark and make sure that you can see:  Any grab bars or handrails.  First and last steps.  Where the edge of each step is.  Use tools that help you move around (mobility aids) if they are needed. These include:  Canes.  Walkers.  Scooters.  Crutches.  Turn on the lights when you go into a dark area. Replace any light bulbs as soon as they burn out.  Set up your furniture so you have a clear path. Avoid moving your furniture around.  If any of your floors are uneven, fix them.  If there are any pets around you, be aware of where they are.  Review your medicines with your doctor. Some medicines can make you feel dizzy. This can increase your chance of falling. Ask your doctor what other things that you can do to help prevent falls. This information is not intended to replace advice given to you by your health care provider. Make sure you discuss any questions you have with your health care provider. Document Released: 01/18/2009 Document Revised: 08/30/2015 Document Reviewed: 04/28/2014 Elsevier Interactive Patient Education  2017 [employer].

## 2018-01-28 ENCOUNTER — Other Ambulatory Visit: Payer: Self-pay | Admitting: Unknown Physician Specialty

## 2018-01-28 NOTE — Telephone Encounter (Signed)
Requested Prescriptions  Pending Prescriptions Disp Refills  . nortriptyline (PAMELOR) 25 MG capsule [Pharmacy Med Name: NORTRIPTYLINE HCL 25 MG CAP] 90 capsule 1    Sig: TAKE 1 CAPSULE (25 MG TOTAL) BY MOUTH AT BEDTIME.     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

## 2018-02-23 DIAGNOSIS — H353121 Nonexudative age-related macular degeneration, left eye, early dry stage: Secondary | ICD-10-CM | POA: Diagnosis not present

## 2018-02-23 DIAGNOSIS — H401131 Primary open-angle glaucoma, bilateral, mild stage: Secondary | ICD-10-CM | POA: Diagnosis not present

## 2018-03-16 DIAGNOSIS — I10 Essential (primary) hypertension: Secondary | ICD-10-CM | POA: Diagnosis not present

## 2018-03-16 DIAGNOSIS — I2510 Atherosclerotic heart disease of native coronary artery without angina pectoris: Secondary | ICD-10-CM | POA: Diagnosis not present

## 2018-03-16 DIAGNOSIS — E782 Mixed hyperlipidemia: Secondary | ICD-10-CM | POA: Diagnosis not present

## 2018-04-16 ENCOUNTER — Encounter: Payer: Self-pay | Admitting: Nurse Practitioner

## 2018-04-16 ENCOUNTER — Ambulatory Visit (INDEPENDENT_AMBULATORY_CARE_PROVIDER_SITE_OTHER): Payer: Medicare HMO | Admitting: Nurse Practitioner

## 2018-04-16 VITALS — BP 116/67 | HR 61 | Temp 97.6°F | Ht 67.0 in | Wt 134.1 lb

## 2018-04-16 DIAGNOSIS — E1142 Type 2 diabetes mellitus with diabetic polyneuropathy: Secondary | ICD-10-CM

## 2018-04-16 DIAGNOSIS — I10 Essential (primary) hypertension: Secondary | ICD-10-CM | POA: Diagnosis not present

## 2018-04-16 DIAGNOSIS — N182 Chronic kidney disease, stage 2 (mild): Secondary | ICD-10-CM | POA: Diagnosis not present

## 2018-04-16 DIAGNOSIS — E1122 Type 2 diabetes mellitus with diabetic chronic kidney disease: Secondary | ICD-10-CM

## 2018-04-16 DIAGNOSIS — I129 Hypertensive chronic kidney disease with stage 1 through stage 4 chronic kidney disease, or unspecified chronic kidney disease: Secondary | ICD-10-CM | POA: Diagnosis not present

## 2018-04-16 DIAGNOSIS — E785 Hyperlipidemia, unspecified: Secondary | ICD-10-CM

## 2018-04-16 LAB — LP+ALT+AST PICCOLO, WAIVED
ALT (SGPT) Piccolo, Waived: 24 U/L (ref 10–47)
AST (SGOT) Piccolo, Waived: 30 U/L (ref 11–38)
CHOLESTEROL PICCOLO, WAIVED: 121 mg/dL (ref <200)
Chol/HDL Ratio Piccolo,Waive: 4.1 mg/dL
HDL Chol Piccolo, Waived: 30 mg/dL — ABNORMAL LOW (ref >59)
LDL CHOL CALC PICCOLO WAIVED: 60 mg/dL (ref <100)
Triglycerides Piccolo,Waived: 159 mg/dL — ABNORMAL HIGH (ref <150)
VLDL CHOL CALC PICCOLO,WAIVE: 32 mg/dL — AB (ref <30)

## 2018-04-16 LAB — BAYER DCA HB A1C WAIVED: HB A1C (BAYER DCA - WAIVED): 8.4 % — ABNORMAL HIGH (ref <7.0)

## 2018-04-16 MED ORDER — SITAGLIPTIN PHOSPHATE 100 MG PO TABS: 100.0000 mg | ORAL_TABLET | Freq: Every day | ORAL | 5 refills | Status: DC

## 2018-04-16 NOTE — Progress Notes (Signed)
BP 116/67 (BP Location: Left Arm, Patient Position: Sitting, Cuff Size: Normal)   Pulse 61   Temp 97.6 F (36.4 C) (Oral)   Ht 5\' 7"  (1.702 m)   Wt 134 lb 1.6 oz (60.8 kg)   SpO2 98%   BMI 21.00 kg/m    Subjective:    Patient ID: Matthew Wolf, male    DOB: 09-22-1945, 73 y.o.   MRN: [id number]  HPI: Matthew Wolf is a 73 y.o. male presents for follow-up  Chief Complaint  Patient presents with  . Hyperlipidemia  . Diabetes   DIABETES Continues on Glipizide XL 10 MG daily.  Last A1C 7.7%, was focusing on diet at home.  Has been on Metformin in the past, but did not tolerate it.  A1C today 8.4%, discussed various medications that could be added on for therapy.  At this time patient wishes to try Januvia.  Will obtain BMP today, last CrCl 61.91 (April 2019).  Discussed risks/benefits and use of medication with patient. Hypoglycemic episodes:no Polydipsia/polyuria: no Visual disturbance: no Chest pain: no Paresthesias: no Glucose Monitoring: no  Accucheck frequency: Not Checking  Fasting glucose:  Post prandial:  Evening:  Before meals: Taking Insulin?: no  Long acting insulin:  Short acting insulin: Blood Pressure Monitoring: not checking Retinal Examination: Up to Date Foot Exam: Up to Date Pneumovax: Up to Date Influenza: Up to Date Aspirin: yes   HYPERTENSION / HYPERLIPIDEMIA Followed by cardiology, last saw Dr. Orson Ape on 03/16/18.  No changes were made.   Satisfied with current treatment? yes Duration of hypertension: chronic BP monitoring frequency: not checking BP range:  BP medication side effects: no Duration of hyperlipidemia: chronic Cholesterol medication side effects: no Cholesterol supplements: none Medication compliance: good compliance Aspirin: yes Recent stressors: no Recurrent headaches: no Visual changes: no Palpitations: no Dyspnea: no Chest pain: no Lower extremity edema: no Dizzy/lightheaded: no  Relevant past medical, surgical,  family and social history reviewed and updated as indicated. Interim medical history since our last visit reviewed. Allergies and medications reviewed and updated.  Review of Systems  Constitutional: Negative for activity change, diaphoresis, fatigue and fever.  Respiratory: Negative for cough, chest tightness, shortness of breath and wheezing.   Cardiovascular: Negative for chest pain, palpitations and leg swelling.  Gastrointestinal: Negative for abdominal distention, abdominal pain, constipation, diarrhea, nausea and vomiting.  Endocrine: Negative for cold intolerance, heat intolerance, polydipsia, polyphagia and polyuria.  Musculoskeletal: Negative.   Skin: Negative.   Neurological: Negative for dizziness, syncope, weakness, light-headedness, numbness and headaches.  Psychiatric/Behavioral: Negative.     Per HPI unless specifically indicated above     Objective:    BP 116/67 (BP Location: Left Arm, Patient Position: Sitting, Cuff Size: Normal)   Pulse 61   Temp 97.6 F (36.4 C) (Oral)   Ht 5\' 7"  (1.702 m)   Wt 134 lb 1.6 oz (60.8 kg)   SpO2 98%   BMI 21.00 kg/m   Wt Readings from Last 3 Encounters:  04/16/18 134 lb 1.6 oz (60.8 kg)  01/25/18 136 lb 6.4 oz (61.9 kg)  01/13/18 135 lb (61.2 kg)    Physical Exam Vitals signs and nursing note reviewed.  Constitutional:      Appearance: He is well-developed.  HENT:     Head: Normocephalic and atraumatic.     Right Ear: Hearing normal. No drainage.     Left Ear: Hearing normal. No drainage.     Mouth/Throat:     Pharynx: Uvula midline.  Eyes:     General: Lids are normal.        Right eye: No discharge.        Left eye: No discharge.     Conjunctiva/sclera: Conjunctivae normal.     Pupils: Pupils are equal, round, and reactive to light.  Neck:     Musculoskeletal: Normal range of motion and neck supple.     Thyroid: No thyromegaly.     Vascular: No carotid bruit or JVD.     Trachea: Trachea normal.  Cardiovascular:      Rate and Rhythm: Normal rate and regular rhythm.     Heart sounds: Normal heart sounds, S1 normal and S2 normal. No murmur. No gallop.   Pulmonary:     Effort: Pulmonary effort is normal.     Breath sounds: Normal breath sounds.  Abdominal:     General: Bowel sounds are normal.     Palpations: Abdomen is soft. There is no hepatomegaly or splenomegaly.  Musculoskeletal: Normal range of motion.  Skin:    General: Skin is warm and dry.     Capillary Refill: Capillary refill takes less than 2 seconds.     Findings: No rash.  Neurological:     Mental Status: He is alert and oriented to person, place, and time.     Deep Tendon Reflexes: Reflexes are normal and symmetric.  Psychiatric:        Mood and Affect: Mood normal.        Behavior: Behavior normal.        Thought Content: Thought content normal.        Judgment: Judgment normal.    Diabetic Foot Exam - Simple   Simple Foot Form Visual Inspection No deformities, no ulcerations, no other skin breakdown bilaterally:  Yes Sensation Testing See comments:  Yes Pulse Check See comments:  Yes Comments Diminished sensation both feet on monofilament R>L.  1+ pedal pulses.     Results for orders placed or performed in visit on 04/16/18  LP+ALT+AST Piccolo, Waived  Result Value Ref Range   ALT (SGPT) Piccolo, Waived 24 10 - 47 U/L   AST (SGOT) Piccolo, Waived 30 11 - 38 U/L   Cholesterol Piccolo, Waived 121 <200 mg/dL   HDL Chol Piccolo, Waived 30 (L) >59 mg/dL   Triglycerides Piccolo,Waived 159 (H) <150 mg/dL   Chol/HDL Ratio Piccolo,Waive 4.1 mg/dL   LDL Chol Calc Piccolo Waived 60 <100 mg/dL   VLDL Chol Calc Piccolo,Waive 32 (H) <30 mg/dL  Bayer DCA Hb C4E Waived  Result Value Ref Range   HB A1C (BAYER DCA - WAIVED) 8.4 (H) <7.0 %      Assessment & Plan:   Problem List Items Addressed This Visit      Cardiovascular and Mediastinum   Essential hypertension, benign    Chronic, stable.  Continue current med regimen.   BMP today.  Continue to collaborate with cardiology.      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  A1C today 8.4% and previous 7.7%.  Script for Januvia 100 MG daily sent.  Will continue Glipizide at current dose.  Continue to check FSBS at home daily.  Focus on diabetic diet.  April CrCl 61.91.  BMP today.      Relevant Medications   sitaGLIPtin (JANUVIA) 100 MG tablet   Other Relevant Orders   Bayer DCA Hb A1c Waived (  Completed)   Basic metabolic panel     Endocrine   Diabetic peripheral neuropathy (HCC)    Chronic, ongoing.  A1C today 8.4% and previous 7.7%.  Script for Januvia 100 MG daily sent.  Will continue Glipizide at current dose.  Continue to check FSBS at home daily.  Continue Nortriptyline as ordered.      Relevant Medications   sitaGLIPtin (JANUVIA) 100 MG tablet     Other   Hyperlipidemia    Chronic, stable.  TCHOL 121 and LDL 60 on panel today, with AST/ALT 30/24.  Continue current med regimen.      Relevant Orders   LP+ALT+AST Piccolo, Waived (Completed)       Follow up plan: Return in about 3 months (around 07/16/2018) for T2DM, HTN/HLD.

## 2018-04-16 NOTE — Assessment & Plan Note (Signed)
Chronic, ongoing.  A1C today 8.4% and previous 7.7%.  Script for Januvia 100 MG daily sent.  Will continue Glipizide at current dose.  Continue to check FSBS at home daily.  Continue Nortriptyline as ordered.

## 2018-04-16 NOTE — Patient Instructions (Signed)
Diabetes Mellitus and Nutrition, Adult When you have diabetes (diabetes mellitus), it is very important to have healthy eating habits because your blood sugar (glucose) levels are greatly affected by what you eat and drink. Eating healthy foods in the appropriate amounts, at about the same times every day, can help you:  Control your blood glucose.  Lower your risk of heart disease.  Improve your blood pressure.  Reach or maintain a healthy weight. Every person with diabetes is different, and each person has different needs for a meal plan. Your health care provider may recommend that you work with a diet and nutrition specialist (dietitian) to make a meal plan that is best for you. Your meal plan may vary depending on factors such as:  The calories you need.  The medicines you take.  Your weight.  Your blood glucose, blood pressure, and cholesterol levels.  Your activity level.  Other health conditions you have, such as heart or kidney disease. How do carbohydrates affect me? Carbohydrates, also called carbs, affect your blood glucose level more than any other type of food. Eating carbs naturally raises the amount of glucose in your blood. Carb counting is a method for keeping track of how many carbs you eat. Counting carbs is important to keep your blood glucose at a healthy level, especially if you use insulin or take certain oral diabetes medicines. It is important to know how many carbs you can safely have in each meal. This is different for every person. Your dietitian can help you calculate how many carbs you should have at each meal and for each snack. Foods that contain carbs include:  Bread, cereal, rice, pasta, and crackers.  Potatoes and corn.  Peas, beans, and lentils.  Milk and yogurt.  Fruit and juice.  Desserts, such as cakes, cookies, ice cream, and candy. How does alcohol affect me? Alcohol can cause a sudden decrease in blood glucose (hypoglycemia),  especially if you use insulin or take certain oral diabetes medicines. Hypoglycemia can be a life-threatening condition. Symptoms of hypoglycemia (sleepiness, dizziness, and confusion) are similar to symptoms of having too much alcohol. If your health care provider says that alcohol is safe for you, follow these guidelines:  Limit alcohol intake to no more than 1 drink per day for nonpregnant women and 2 drinks per day for men. One drink equals 12 oz of beer, 5 oz of wine, or 1 oz of hard liquor.  Do not drink on an empty stomach.  Keep yourself hydrated with water, diet soda, or unsweetened iced tea.  Keep in mind that regular soda, juice, and other mixers may contain a lot of sugar and must be counted as carbs. What are tips for following this plan?  Reading food labels  Start by checking the serving size on the "Nutrition Facts" label of packaged foods and drinks. The amount of calories, carbs, fats, and other nutrients listed on the label is based on one serving of the item. Many items contain more than one serving per package.  Check the total grams (g) of carbs in one serving. You can calculate the number of servings of carbs in one serving by dividing the total carbs by 15. For example, if a food has 30 g of total carbs, it would be equal to 2 servings of carbs.  Check the number of grams (g) of saturated and trans fats in one serving. Choose foods that have low or no amount of these fats.  Check the number of   milligrams (mg) of salt (sodium) in one serving. Most people should limit total sodium intake to less than 2,300 mg per day.  Always check the nutrition information of foods labeled as "low-fat" or "nonfat". These foods may be higher in added sugar or refined carbs and should be avoided.  Talk to your dietitian to identify your daily goals for nutrients listed on the label. Shopping  Avoid buying canned, premade, or processed foods. These foods tend to be high in fat, sodium,  and added sugar.  Shop around the outside edge of the grocery store. This includes fresh fruits and vegetables, bulk grains, fresh meats, and fresh dairy. Cooking  Use low-heat cooking methods, such as baking, instead of high-heat cooking methods like deep frying.  Cook using healthy oils, such as olive, canola, or sunflower oil.  Avoid cooking with butter, cream, or high-fat meats. Meal planning  Eat meals and snacks regularly, preferably at the same times every day. Avoid going long periods of time without eating.  Eat foods high in fiber, such as fresh fruits, vegetables, beans, and whole grains. Talk to your dietitian about how many servings of carbs you can eat at each meal.  Eat 4-6 ounces (oz) of lean protein each day, such as lean meat, chicken, fish, eggs, or tofu. One oz of lean protein is equal to: ? 1 oz of meat, chicken, or fish. ? 1 egg. ?  cup of tofu.  Eat some foods each day that contain healthy fats, such as avocado, nuts, seeds, and fish. Lifestyle  Check your blood glucose regularly.  Exercise regularly as told by your health care provider. This may include: ? 150 minutes of moderate-intensity or vigorous-intensity exercise each week. This could be brisk walking, biking, or water aerobics. ? Stretching and doing strength exercises, such as yoga or weightlifting, at least 2 times a week.  Take medicines as told by your health care provider.  Do not use any products that contain nicotine or tobacco, such as cigarettes and e-cigarettes. If you need help quitting, ask your health care provider.  Work with a Veterinary surgeoncounselor or diabetes educator to identify strategies to manage stress and any emotional and social challenges. Questions to ask a health care provider  Do I need to meet with a diabetes educator?  Do I need to meet with a dietitian?  What number can I call if I have questions?  When are the best times to check my blood glucose? Where to find more  information:  American Diabetes Association: diabetes.org  Academy of Nutrition and Dietetics: www.eatright.[employer]org  National Institute of Diabetes and Digestive and Kidney Diseases (NIH): CarFlippers.tnwww.niddk.nih.gov Summary  A healthy meal plan will help you control your blood glucose and maintain a healthy lifestyle.  Working with a diet and nutrition specialist (dietitian) can help you make a meal plan that is best for you.  Keep in mind that carbohydrates (carbs) and alcohol have immediate effects on your blood glucose levels. It is important to count carbs and to use alcohol carefully. This information is not intended to replace advice given to you by your health care provider. Make sure you discuss any questions you have with your health care provider. Document Released: 12/19/2004 Document Revised: 10/22/2016 Document Reviewed: 04/28/2016 Elsevier Interactive Patient Education  2019 Elsevier Inc. Sitagliptin oral tablet What is this medicine? SITAGLIPTIN (sit a GLIP tin) helps to treat type 2 diabetes. It helps to control blood sugar. Treatment is combined with diet and exercise. This medicine may be  used for other purposes; ask your health care provider or pharmacist if you have questions. COMMON BRAND NAME(S): Januvia What should I tell my health care provider before I take this medicine? They need to know if you have any of these conditions: -diabetic ketoacidosis -kidney disease -pancreatitis -previous swelling of the tongue, face, or lips with difficulty breathing, difficulty swallowing, hoarseness, or tightening of the throat -type 1 diabetes -an unusual or allergic reaction to sitagliptin, other medicines, foods, dyes, or preservatives -pregnant or trying to get pregnant -breast-feeding How should I use this medicine? Take this medicine by mouth with a glass of water. Follow the directions on the prescription label. You can take it with or without food. Do not cut, crush or chew  this medicine. Take your dose at the same time each day. Do not take more often than directed. Do not stop taking except on your doctor's advice. A special MedGuide will be given to you by the pharmacist with each prescription and refill. Be sure to read this information carefully each time. Talk to your pediatrician regarding the use of this medicine in children. Special care may be needed. Overdosage: If you think you have taken too much of this medicine contact a poison control center or emergency room at once. NOTE: This medicine is only for you. Do not share this medicine with others. What if I miss a dose? If you miss a dose, take it as soon as you can. If it is almost time for your next dose, take only that dose. Do not take double or extra doses. What may interact with this medicine? Do not take this medicine with any of the following medications: -gatifloxacin This medicine may also interact with the following medications: -alcohol -digoxin -insulin -sulfonylureas like glimepiride, glipizide, glyburide This list may not describe all possible interactions. Give your health care provider a list of all the medicines, herbs, non-prescription drugs, or dietary supplements you use. Also tell them if you smoke, drink alcohol, or use illegal drugs. Some items may interact with your medicine. What should I watch for while using this medicine? Visit your doctor or health care professional for regular checks on your progress. A test called the HbA1C (A1C) will be monitored. This is a simple blood test. It measures your blood sugar control over the last 2 to 3 months. You will receive this test every 3 to 6 months. Learn how to check your blood sugar. Learn the symptoms of low and high blood sugar and how to manage them. Always carry a quick-source of sugar with you in case you have symptoms of low blood sugar. Examples include hard sugar candy or glucose tablets. Make sure others know that you can  choke if you eat or drink when you develop serious symptoms of low blood sugar, such as seizures or unconsciousness. They must get medical help at once. Tell your doctor or health care professional if you have high blood sugar. You might need to change the dose of your medicine. If you are sick or exercising more than usual, you might need to change the dose of your medicine. Do not skip meals. Ask your doctor or health care professional if you should avoid alcohol. Many nonprescription cough and cold products contain sugar or alcohol. These can affect blood sugar. Wear a medical ID bracelet or chain, and carry a card that describes your disease and details of your medicine and dosage times. What side effects may I notice from receiving this medicine? Side  effects that you should report to your doctor or health care professional as soon as possible: -allergic reactions like skin rash, itching or hives, swelling of the face, lips, or tongue -breathing problems -general ill feeling or flu-like symptoms -joint pain -loss of appetite -redness, blistering, peeling or loosening of the skin, including inside the mouth -signs and symptoms of heart failure like breathing problems, fast, irregular heartbeat, sudden weight gain; swelling of the ankles, feet, hands; unusually weak or tired -signs and symptoms of low blood sugar such as feeling anxious, confusion, dizziness, increased hunger, unusually weak or tired, sweating, shakiness, cold, irritable, headache, blurred vision, fast heartbeat, loss of consciousness -signs and symptoms of muscle injury like dark urine; trouble passing urine or change in the amount of urine; unusually weak or tired; muscle pain; back pain -unusual stomach upset or pain -vomiting Side effects that usually do not require medical attention (report to your doctor or health care professional if they continue or are bothersome): -diarrhea -headache -sore throat -stomach  upset -stuffy or runny nose This list may not describe all possible side effects. Call your doctor for medical advice about side effects. You may report side effects to FDA at 1-800-FDA-1088. Where should I keep my medicine? Keep out of the reach of children. Store at room temperature between 15 and 30 degrees C (59 and 86 degrees F). Throw away any unused medicine after the expiration date. NOTE: This sheet is a summary. It may not cover all possible information. If you have questions about this medicine, talk to your doctor, pharmacist, or health care provider.  2019 Elsevier/Gold Standard (2017-10-27 16:38:43)

## 2018-04-16 NOTE — Assessment & Plan Note (Addendum)
Chronic, stable.  Continue current med regimen.  BMP today.  Continue to collaborate with cardiology.

## 2018-04-16 NOTE — Assessment & Plan Note (Signed)
Chronic, stable.  TCHOL 121 and LDL 60 on panel today, with AST/ALT 30/24.  Continue current med regimen.

## 2018-04-16 NOTE — Assessment & Plan Note (Signed)
Chronic, ongoing.  A1C today 8.4% and previous 7.7%.  Script for Januvia 100 MG daily sent.  Will continue Glipizide at current dose.  Continue to check FSBS at home daily.  Focus on diabetic diet.  April CrCl 61.91.  BMP today.

## 2018-04-17 LAB — BASIC METABOLIC PANEL
BUN/Creatinine Ratio: 18 (ref 10–24)
BUN: 19 mg/dL (ref 8–27)
CO2: 21 mmol/L (ref 20–29)
CREATININE: 1.04 mg/dL (ref 0.76–1.27)
Calcium: 8.9 mg/dL (ref 8.6–10.2)
Chloride: 96 mmol/L (ref 96–106)
GFR, EST AFRICAN AMERICAN: 81 mL/min/{1.73_m2} (ref >59)
GFR, EST NON AFRICAN AMERICAN: 70 mL/min/{1.73_m2} (ref >59)
Glucose: 137 mg/dL — ABNORMAL HIGH (ref 65–99)
Potassium: 4.7 mmol/L (ref 3.5–5.2)
Sodium: 134 mmol/L (ref 134–144)

## 2018-04-18 NOTE — Progress Notes (Signed)
Normal test results noted.  Please call patient and make them aware of normal results and will continue to monitor at regular visits.  Have a great day.  Look forward to seeing you at your next visit.

## 2018-04-19 ENCOUNTER — Other Ambulatory Visit: Payer: Self-pay | Admitting: Nurse Practitioner

## 2018-04-19 ENCOUNTER — Telehealth: Payer: Self-pay | Admitting: Nurse Practitioner

## 2018-04-19 MED ORDER — METFORMIN HCL 500 MG PO TABS: 500.0000 mg | ORAL_TABLET | Freq: Two times a day (BID) | ORAL | 3 refills | Status: DC

## 2018-04-19 NOTE — Telephone Encounter (Signed)
Copied from CRM [id number]. Topic: Quick Communication - Rx Refill/Question >> Apr 19, 2018  1:11 PM Zada Girt, Lumin L wrote: Medication: sitaGLIPtin (JANUVIA) 100 MG tablet (too expensive so wants METFORMIN instead which is what he was taking before)  Has the patient contacted their pharmacy? Yes.   (Agent: If no, request that the patient contact the pharmacy for the refill.) (Agent: If yes, when and what did the pharmacy advise?)  Preferred Pharmacy (with phone number or street name): [id number] - [place], [place] - [address] [place] [address] Phone: [phone] Fax: [phone]  Agent: Please be advised that RX refills may take up to 3 business days. We ask that you follow-up with your pharmacy.

## 2018-04-19 NOTE — Progress Notes (Signed)
Patient reports Januvia too expensive and wishes to try Metformin again.  Will send script for Metformin 500 MG twice a day with meal and monitor.

## 2018-04-19 NOTE — Telephone Encounter (Signed)
I sent in script for Metformin 500 MG twice a day and he is to take with meals.  Let him know if he has any nausea he can try once a day dosing and slowly increase to twice a day.  Thanks

## 2018-04-20 NOTE — Telephone Encounter (Signed)
Tried to call house number but it is disconnected.

## 2018-04-20 NOTE — Telephone Encounter (Signed)
LVM for pt to call back for details.

## 2018-04-20 NOTE — Telephone Encounter (Signed)
Tried to call pt on his cell again, still no answer.

## 2018-04-20 NOTE — Telephone Encounter (Signed)
Pt.notified

## 2018-05-23 ENCOUNTER — Other Ambulatory Visit: Payer: Self-pay | Admitting: Nurse Practitioner

## 2018-05-24 NOTE — Telephone Encounter (Signed)
Requested Prescriptions  Pending Prescriptions Disp Refills  . nortriptyline (PAMELOR) 25 MG capsule [Pharmacy Med Name: NORTRIPTYLINE HCL 25 MG CAP] 180 capsule 0    Sig: TAKE 1 CAPSULE (25 MG TOTAL) BY MOUTH 2 (TWO) TIMES DAILY.     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

## 2018-07-12 ENCOUNTER — Telehealth: Payer: Self-pay | Admitting: Nurse Practitioner

## 2018-07-12 ENCOUNTER — Other Ambulatory Visit: Payer: Self-pay | Admitting: Nurse Practitioner

## 2018-07-12 DIAGNOSIS — N182 Chronic kidney disease, stage 2 (mild): Principal | ICD-10-CM

## 2018-07-12 DIAGNOSIS — E1122 Type 2 diabetes mellitus with diabetic chronic kidney disease: Secondary | ICD-10-CM

## 2018-07-12 DIAGNOSIS — I129 Hypertensive chronic kidney disease with stage 1 through stage 4 chronic kidney disease, or unspecified chronic kidney disease: Secondary | ICD-10-CM

## 2018-07-12 NOTE — Telephone Encounter (Signed)
Called pt in regards to Friday appt and scheduling labs, no answer. LMOM.

## 2018-07-12 NOTE — Progress Notes (Signed)
Lab orders only 

## 2018-07-14 ENCOUNTER — Other Ambulatory Visit: Payer: Medicare HMO

## 2018-07-14 ENCOUNTER — Other Ambulatory Visit: Payer: Self-pay

## 2018-07-14 DIAGNOSIS — E1122 Type 2 diabetes mellitus with diabetic chronic kidney disease: Secondary | ICD-10-CM | POA: Diagnosis not present

## 2018-07-14 DIAGNOSIS — N182 Chronic kidney disease, stage 2 (mild): Secondary | ICD-10-CM | POA: Diagnosis not present

## 2018-07-14 DIAGNOSIS — I129 Hypertensive chronic kidney disease with stage 1 through stage 4 chronic kidney disease, or unspecified chronic kidney disease: Secondary | ICD-10-CM

## 2018-07-14 LAB — BAYER DCA HB A1C WAIVED: HB A1C (BAYER DCA - WAIVED): 7.3 % — ABNORMAL HIGH (ref <7.0)

## 2018-07-14 LAB — MICROALBUMIN, URINE WAIVED
Creatinine, Urine Waived: 50 mg/dL (ref 10–300)
Microalb, Ur Waived: 10 mg/L (ref 0–19)

## 2018-07-16 ENCOUNTER — Encounter: Payer: Self-pay | Admitting: Nurse Practitioner

## 2018-07-16 ENCOUNTER — Other Ambulatory Visit: Payer: Self-pay

## 2018-07-16 ENCOUNTER — Ambulatory Visit (INDEPENDENT_AMBULATORY_CARE_PROVIDER_SITE_OTHER): Payer: Medicare HMO | Admitting: Nurse Practitioner

## 2018-07-16 VITALS — BP 106/59 | HR 80 | Ht 67.0 in | Wt 135.0 lb

## 2018-07-16 DIAGNOSIS — N182 Chronic kidney disease, stage 2 (mild): Secondary | ICD-10-CM

## 2018-07-16 DIAGNOSIS — E1122 Type 2 diabetes mellitus with diabetic chronic kidney disease: Secondary | ICD-10-CM | POA: Diagnosis not present

## 2018-07-16 DIAGNOSIS — I10 Essential (primary) hypertension: Secondary | ICD-10-CM | POA: Diagnosis not present

## 2018-07-16 DIAGNOSIS — I129 Hypertensive chronic kidney disease with stage 1 through stage 4 chronic kidney disease, or unspecified chronic kidney disease: Secondary | ICD-10-CM | POA: Diagnosis not present

## 2018-07-16 DIAGNOSIS — E785 Hyperlipidemia, unspecified: Secondary | ICD-10-CM

## 2018-07-16 MED ORDER — METFORMIN HCL 500 MG PO TABS: 500.0000 mg | ORAL_TABLET | Freq: Two times a day (BID) | ORAL | 3 refills | Status: DC

## 2018-07-16 NOTE — Progress Notes (Signed)
BP (!) 106/59   Pulse 80   Ht 5\' 7"  (1.702 m)   Wt 135 lb (61.2 kg)   BMI 21.14 kg/m    Subjective:    Patient ID: Matthew RakeJerry C Wolf, male    DOB: August 01, 1945, 73 y.o.   MRN: [id number]  HPI: Matthew Wolf is a 73 y.o. male  Chief Complaint  Patient presents with  . Follow-up  . Diabetes  . Hypertension  . Hyperlipidemia    . This visit was completed via telephone due to the restrictions of the COVID-19 pandemic. All issues as above were discussed and addressed but no physical exam was performed. If it was felt that the patient should be evaluated in the office, they were directed there. The patient verbally consented to this visit. Patient was unable to complete an audio/visual visit due to Lack of equipment. Due to the catastrophic nature of the COVID-19 pandemic, this visit was done through audio contact only. . Location of the patient: home . Location of the provider: work . Those involved with this call:  . Provider: Aura DialsJolene Cannady, DNP . CMA: Sheilah MinsJada Fox, CMA . Front Desk/Registration: Harriet PhoJoliza Johnson  . Time spent on call: 15 minutes on the phone discussing health concerns. 10 minutes total spent in review of patient's record and preparation of their chart.   DIABETES Continues on Metformin 500 MG two times a day and Glipizide XL 10 MG daily.  Last A1C 8.4% and today 7.3%.  In January had written for Januvia, but was too expensive.  He wished to restart Metformin instead, which was written for at the time.  Does endorse he has not always been taking it consistently, sometimes once a day.  Cut out eating candy at work and eating less pie.   Hypoglycemic episodes:no Polydipsia/polyuria: no Visual disturbance: no Chest pain: no Paresthesias: no Glucose Monitoring: yes  Accucheck frequency: Not Checking  Fasting glucose:  Post prandial:  Evening:  Before meals: Taking Insulin?: no  Long acting insulin:  Short acting insulin: Blood Pressure Monitoring: rarely Retinal  Exam: Not Up Date Pneumovax: Up to Date Influenza: Up to Date Aspirin: yes   HYPERTENSION / HYPERLIPIDEMIA Continues to be well-controlled on Atenolol and Ramipril + Lipitor. Satisfied with current treatment? yes Duration of hypertension: chronic BP monitoring frequency: rarely BP range: 110/60-70's BP medication side effects: no Duration of hyperlipidemia: chronic Cholesterol medication side effects: no Cholesterol supplements: none Medication compliance: good compliance Aspirin: yes Recent stressors: no Recurrent headaches: no Visual changes: no Palpitations: no Dyspnea: no Chest pain: no Lower extremity edema: no Dizzy/lightheaded: no  Relevant past medical, surgical, family and social history reviewed and updated as indicated. Interim medical history since our last visit reviewed. Allergies and medications reviewed and updated.  Review of Systems  Constitutional: Negative for activity change, diaphoresis, fatigue and fever.  Respiratory: Negative for cough, chest tightness, shortness of breath and wheezing.   Cardiovascular: Negative for chest pain, palpitations and leg swelling.  Gastrointestinal: Negative for abdominal distention, abdominal pain, constipation, diarrhea, nausea and vomiting.  Endocrine: Negative for cold intolerance, heat intolerance, polydipsia, polyphagia and polyuria.  Musculoskeletal: Negative.   Skin: Negative.   Neurological: Negative for dizziness, syncope, weakness, light-headedness, numbness and headaches.  Psychiatric/Behavioral: Negative.     Per HPI unless specifically indicated above     Objective:    BP (!) 106/59   Pulse 80   Ht 5\' 7"  (1.702 m)   Wt 135 lb (61.2 kg)   BMI 21.14 kg/m  Wt Readings from Last 3 Encounters:  07/16/18 135 lb (61.2 kg)  04/16/18 134 lb 1.6 oz (60.8 kg)  01/25/18 136 lb 6.4 oz (61.9 kg)    Physical Exam   Unable to perform due to patient with no visual access.  Results for orders placed or  performed in visit on 07/14/18  Microalbumin, Urine Waived  Result Value Ref Range   Microalb, Ur Waived 10 0 - 19 mg/L   Creatinine, Urine Waived 50 10 - 300 mg/dL   Microalb/Creat Ratio 30-300 (H) <30 mg/g  Bayer DCA Hb A1c Waived  Result Value Ref Range   HB A1C (BAYER DCA - WAIVED) 7.3 (H) <7.0 %      Assessment & Plan:   Problem List Items Addressed This Visit      Cardiovascular and Mediastinum   Essential hypertension, benign    Chronic, ongoing.  Continue current medication regimen.  Continue to collaborate with cardiology.      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  A1C today trending downwards at 7.3%, urine alb 10.  Recommend he consistently take Metformin 500 MG twice a day and continue current Glipizide dose.  Recommend checking FSBS at least three mornings a week and document for provider visits.  Continue heavy focus on diet changes.  Return in 3 months.  If A1C remains above 7 will increase Metformin to 1000 MG BID.  Recent GFR 70.      Relevant Medications   metFORMIN (GLUCOPHAGE) 500 MG tablet     Other   Hyperlipidemia    Chronic, ongoing.  Continue current medication regimen.           I discussed the assessment and treatment plan with the patient. The patient was provided an opportunity to ask questions and all were answered. The patient agreed with the plan and demonstrated an understanding of the instructions.   The patient was advised to call back or seek an in-person evaluation if the symptoms worsen or if the condition fails to improve as anticipated.   Follow up plan: Return in about 3 months (around 10/15/2018) for T2DM, HTN/HLD.

## 2018-07-16 NOTE — Assessment & Plan Note (Signed)
Chronic, ongoing.  Continue current medication regimen.  Continue to collaborate with cardiology. 

## 2018-07-16 NOTE — Patient Instructions (Signed)
Carbohydrate Counting for Diabetes Mellitus, Adult  Carbohydrate counting is a method of keeping track of how many carbohydrates you eat. Eating carbohydrates naturally increases the amount of sugar (glucose) in the blood. Counting how many carbohydrates you eat helps keep your blood glucose within normal limits, which helps you manage your diabetes (diabetes mellitus). It is important to know how many carbohydrates you can safely have in each meal. This is different for every person. A diet and nutrition specialist (registered dietitian) can help you make a meal plan and calculate how many carbohydrates you should have at each meal and snack. Carbohydrates are found in the following foods:  Grains, such as breads and cereals.  Dried beans and soy products.  Starchy vegetables, such as potatoes, peas, and corn.  Fruit and fruit juices.  Milk and yogurt.  Sweets and snack foods, such as cake, cookies, candy, chips, and soft drinks. How do I count carbohydrates? There are two ways to count carbohydrates in food. You can use either of the methods or a combination of both. Reading "Nutrition Facts" on packaged food The "Nutrition Facts" list is included on the labels of almost all packaged foods and beverages in the [place] It includes:  The serving size.  Information about nutrients in each serving, including the grams (g) of carbohydrate per serving. To use the "Nutrition Facts":  Decide how many servings you will have.  Multiply the number of servings by the number of carbohydrates per serving.  The resulting number is the total amount of carbohydrates that you will be having. Learning standard serving sizes of other foods When you eat carbohydrate foods that are not packaged or do not include "Nutrition Facts" on the label, you need to measure the servings in order to count the amount of carbohydrates:  Measure the foods that you will eat with a food scale or measuring cup, if needed.   Decide how many standard-size servings you will eat.  Multiply the number of servings by 15. Most carbohydrate-rich foods have about 15 g of carbohydrates per serving. ? For example, if you eat 8 oz (170 g) of strawberries, you will have eaten 2 servings and 30 g of carbohydrates (2 servings x 15 g = 30 g).  For foods that have more than one food mixed, such as soups and casseroles, you must count the carbohydrates in each food that is included. The following list contains standard serving sizes of common carbohydrate-rich foods. Each of these servings has about 15 g of carbohydrates:   hamburger bun or  English muffin.   oz (15 mL) syrup.   oz (14 g) jelly.  1 slice of bread.  1 six-inch tortilla.  3 oz (85 g) cooked rice or pasta.  4 oz (113 g) cooked dried beans.  4 oz (113 g) starchy vegetable, such as peas, corn, or potatoes.  4 oz (113 g) hot cereal.  4 oz (113 g) mashed potatoes or  of a large baked potato.  4 oz (113 g) canned or frozen fruit.  4 oz (120 mL) fruit juice.  4-6 crackers.  6 chicken nuggets.  6 oz (170 g) unsweetened dry cereal.  6 oz (170 g) plain fat-free yogurt or yogurt sweetened with artificial sweeteners.  8 oz (240 mL) milk.  8 oz (170 g) fresh fruit or one small piece of fruit.  24 oz (680 g) popped popcorn. Example of carbohydrate counting Sample meal  3 oz (85 g) chicken breast.  6 oz (170 g)   brown rice.  4 oz (113 g) corn.  8 oz (240 mL) milk.  8 oz (170 g) strawberries with sugar-free whipped topping. Carbohydrate calculation 1. Identify the foods that contain carbohydrates: ? Rice. ? Corn. ? Milk. ? Strawberries. 2. Calculate how many servings you have of each food: ? 2 servings rice. ? 1 serving corn. ? 1 serving milk. ? 1 serving strawberries. 3. Multiply each number of servings by 15 g: ? 2 servings rice x 15 g = 30 g. ? 1 serving corn x 15 g = 15 g. ? 1 serving milk x 15 g = 15 g. ? 1 serving  strawberries x 15 g = 15 g. 4. Add together all of the amounts to find the total grams of carbohydrates eaten: ? 30 g + 15 g + 15 g + 15 g = 75 g of carbohydrates total. Summary  Carbohydrate counting is a method of keeping track of how many carbohydrates you eat.  Eating carbohydrates naturally increases the amount of sugar (glucose) in the blood.  Counting how many carbohydrates you eat helps keep your blood glucose within normal limits, which helps you manage your diabetes.  A diet and nutrition specialist (registered dietitian) can help you make a meal plan and calculate how many carbohydrates you should have at each meal and snack. This information is not intended to replace advice given to you by your health care provider. Make sure you discuss any questions you have with your health care provider. Document Released: 03/24/2005 Document Revised: 10/01/2016 Document Reviewed: 09/05/2015 Elsevier Interactive Patient Education  2019 Elsevier Inc.  

## 2018-07-16 NOTE — Assessment & Plan Note (Addendum)
Chronic, ongoing.  A1C today trending downwards at 7.3%, urine alb 10.  Recommend he consistently take Metformin 500 MG twice a day and continue current Glipizide dose.  Recommend checking FSBS at least three mornings a week and document for provider visits.  Continue heavy focus on diet changes.  Return in 3 months.  If A1C remains above 7 will increase Metformin to 1000 MG BID.  Recent GFR 70.

## 2018-07-16 NOTE — Assessment & Plan Note (Signed)
Chronic, ongoing.  Continue current medication regimen.   

## 2018-08-17 ENCOUNTER — Other Ambulatory Visit: Payer: Self-pay | Admitting: Nurse Practitioner

## 2018-08-17 NOTE — Telephone Encounter (Signed)
Is this okay to refill? 

## 2018-08-17 NOTE — Telephone Encounter (Signed)
Requested Prescriptions  Pending Prescriptions Disp Refills  . nortriptyline (PAMELOR) 25 MG capsule [Pharmacy Med Name: NORTRIPTYLINE HCL 25 MG CAP] 180 capsule 0    Sig: TAKE 1 CAPSULE (25 MG TOTAL) BY MOUTH 2 (TWO) TIMES DAILY.     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

## 2018-10-18 ENCOUNTER — Ambulatory Visit: Payer: Self-pay | Admitting: Nurse Practitioner

## 2018-10-27 ENCOUNTER — Other Ambulatory Visit: Payer: Self-pay

## 2018-10-27 ENCOUNTER — Encounter: Payer: Self-pay | Admitting: Nurse Practitioner

## 2018-10-27 ENCOUNTER — Ambulatory Visit (INDEPENDENT_AMBULATORY_CARE_PROVIDER_SITE_OTHER): Payer: Medicare HMO | Admitting: Nurse Practitioner

## 2018-10-27 VITALS — BP 118/64 | HR 70 | Temp 97.5°F

## 2018-10-27 DIAGNOSIS — N182 Chronic kidney disease, stage 2 (mild): Secondary | ICD-10-CM | POA: Diagnosis not present

## 2018-10-27 DIAGNOSIS — E1169 Type 2 diabetes mellitus with other specified complication: Secondary | ICD-10-CM

## 2018-10-27 DIAGNOSIS — I129 Hypertensive chronic kidney disease with stage 1 through stage 4 chronic kidney disease, or unspecified chronic kidney disease: Secondary | ICD-10-CM | POA: Diagnosis not present

## 2018-10-27 DIAGNOSIS — E785 Hyperlipidemia, unspecified: Secondary | ICD-10-CM | POA: Diagnosis not present

## 2018-10-27 DIAGNOSIS — I10 Essential (primary) hypertension: Secondary | ICD-10-CM | POA: Diagnosis not present

## 2018-10-27 DIAGNOSIS — E1122 Type 2 diabetes mellitus with diabetic chronic kidney disease: Secondary | ICD-10-CM | POA: Diagnosis not present

## 2018-10-27 LAB — BAYER DCA HB A1C WAIVED: HB A1C (BAYER DCA - WAIVED): 7.5 % — ABNORMAL HIGH (ref <7.0)

## 2018-10-27 NOTE — Assessment & Plan Note (Signed)
Chronic, ongoing.  Continue current medication regimen and adjust as needed.  CMP and lipid panel today. 

## 2018-10-27 NOTE — Patient Instructions (Signed)
Carbohydrate Counting for Diabetes Mellitus, Adult  Carbohydrate counting is a method of keeping track of how many carbohydrates you eat. Eating carbohydrates naturally increases the amount of sugar (glucose) in the blood. Counting how many carbohydrates you eat helps keep your blood glucose within normal limits, which helps you manage your diabetes (diabetes mellitus). It is important to know how many carbohydrates you can safely have in each meal. This is different for every person. A diet and nutrition specialist (registered dietitian) can help you make a meal plan and calculate how many carbohydrates you should have at each meal and snack. Carbohydrates are found in the following foods:  Grains, such as breads and cereals.  Dried beans and soy products.  Starchy vegetables, such as potatoes, peas, and corn.  Fruit and fruit juices.  Milk and yogurt.  Sweets and snack foods, such as cake, cookies, candy, chips, and soft drinks. How do I count carbohydrates? There are two ways to count carbohydrates in food. You can use either of the methods or a combination of both. Reading "Nutrition Facts" on packaged food The "Nutrition Facts" list is included on the labels of almost all packaged foods and beverages in the [place] It includes:  The serving size.  Information about nutrients in each serving, including the grams (g) of carbohydrate per serving. To use the "Nutrition Facts":  Decide how many servings you will have.  Multiply the number of servings by the number of carbohydrates per serving.  The resulting number is the total amount of carbohydrates that you will be having. Learning standard serving sizes of other foods When you eat carbohydrate foods that are not packaged or do not include "Nutrition Facts" on the label, you need to measure the servings in order to count the amount of carbohydrates:  Measure the foods that you will eat with a food scale or measuring cup, if needed.   Decide how many standard-size servings you will eat.  Multiply the number of servings by 15. Most carbohydrate-rich foods have about 15 g of carbohydrates per serving. ? For example, if you eat 8 oz (170 g) of strawberries, you will have eaten 2 servings and 30 g of carbohydrates (2 servings x 15 g = 30 g).  For foods that have more than one food mixed, such as soups and casseroles, you must count the carbohydrates in each food that is included. The following list contains standard serving sizes of common carbohydrate-rich foods. Each of these servings has about 15 g of carbohydrates:   hamburger bun or  English muffin.   oz (15 mL) syrup.   oz (14 g) jelly.  1 slice of bread.  1 six-inch tortilla.  3 oz (85 g) cooked rice or pasta.  4 oz (113 g) cooked dried beans.  4 oz (113 g) starchy vegetable, such as peas, corn, or potatoes.  4 oz (113 g) hot cereal.  4 oz (113 g) mashed potatoes or  of a large baked potato.  4 oz (113 g) canned or frozen fruit.  4 oz (120 mL) fruit juice.  4-6 crackers.  6 chicken nuggets.  6 oz (170 g) unsweetened dry cereal.  6 oz (170 g) plain fat-free yogurt or yogurt sweetened with artificial sweeteners.  8 oz (240 mL) milk.  8 oz (170 g) fresh fruit or one small piece of fruit.  24 oz (680 g) popped popcorn. Example of carbohydrate counting Sample meal  3 oz (85 g) chicken breast.  6 oz (170 g)   brown rice.  4 oz (113 g) corn.  8 oz (240 mL) milk.  8 oz (170 g) strawberries with sugar-free whipped topping. Carbohydrate calculation 1. Identify the foods that contain carbohydrates: ? Rice. ? Corn. ? Milk. ? Strawberries. 2. Calculate how many servings you have of each food: ? 2 servings rice. ? 1 serving corn. ? 1 serving milk. ? 1 serving strawberries. 3. Multiply each number of servings by 15 g: ? 2 servings rice x 15 g = 30 g. ? 1 serving corn x 15 g = 15 g. ? 1 serving milk x 15 g = 15 g. ? 1 serving  strawberries x 15 g = 15 g. 4. Add together all of the amounts to find the total grams of carbohydrates eaten: ? 30 g + 15 g + 15 g + 15 g = 75 g of carbohydrates total. Summary  Carbohydrate counting is a method of keeping track of how many carbohydrates you eat.  Eating carbohydrates naturally increases the amount of sugar (glucose) in the blood.  Counting how many carbohydrates you eat helps keep your blood glucose within normal limits, which helps you manage your diabetes.  A diet and nutrition specialist (registered dietitian) can help you make a meal plan and calculate how many carbohydrates you should have at each meal and snack. This information is not intended to replace advice given to you by your health care provider. Make sure you discuss any questions you have with your health care provider. Document Released: 03/24/2005 Document Revised: 10/16/2016 Document Reviewed: 09/05/2015 Elsevier Patient Education  2020 Elsevier Inc.  

## 2018-10-27 NOTE — Assessment & Plan Note (Signed)
Chronic, stable with BP at goal.  Continue current medication regimen.  CMP today.

## 2018-10-27 NOTE — Progress Notes (Signed)
BP 118/64   Pulse 70   Temp (!) 97.5 F (36.4 C) (Oral)   SpO2 97%    Subjective:    Patient ID: Matthew Wolf, male    DOB: 12-May-1945, 73 y.o.   MRN: [id number]  HPI: Matthew Wolf is a 73 y.o. male  Chief Complaint  Patient presents with  . Diabetes    pt states he has an eye exam in August  . Hyperlipidemia  . Hypertension   DIABETES April A1C 7.3%.  Continues on Metformin 500 MG two times a day and Glipizide XL 10 MG daily.  Has been tolerating Metformin without GI issues this time. Hypoglycemic episodes:no Polydipsia/polyuria: no Visual disturbance: no Chest pain: no Paresthesias: no Glucose Monitoring: no  Accucheck frequency: Not Checking  Fasting glucose:  Post prandial:  Evening:  Before meals: Taking Insulin?: no  Long acting insulin:  Short acting insulin: Blood Pressure Monitoring: daily Retinal Examination: goes in August Foot Exam: Up to Date Pneumovax: Up to Date Influenza: Up to Date Aspirin: yes   HYPERTENSION / HYPERLIPIDEMIA Continues to be well-controlled on Atenolol and Ramipril + Lipitor. Satisfied with current treatment? yes Duration of hypertension: chronic BP monitoring frequency: daily BP range: 110-120/60-70 BP medication side effects: no Duration of hyperlipidemia: chronic Cholesterol medication side effects: no Cholesterol supplements: none Medication compliance: good compliance Aspirin: yes Recent stressors: no Recurrent headaches: no Visual changes: no Palpitations: no Dyspnea: no Chest pain: no Lower extremity edema: no Dizzy/lightheaded: no  Relevant past medical, surgical, family and social history reviewed and updated as indicated. Interim medical history since our last visit reviewed. Allergies and medications reviewed and updated.  Review of Systems  Constitutional: Negative for activity change, diaphoresis, fatigue and fever.  Respiratory: Negative for cough, chest tightness, shortness of breath and  wheezing.   Cardiovascular: Negative for chest pain, palpitations and leg swelling.  Gastrointestinal: Negative for abdominal distention, abdominal pain, constipation, diarrhea, nausea and vomiting.  Endocrine: Negative for cold intolerance, heat intolerance, polydipsia, polyphagia and polyuria.  Musculoskeletal: Negative.   Skin: Negative.   Neurological: Negative for dizziness, syncope, weakness, light-headedness, numbness and headaches.  Psychiatric/Behavioral: Negative.     Per HPI unless specifically indicated above     Objective:    BP 118/64   Pulse 70   Temp (!) 97.5 F (36.4 C) (Oral)   SpO2 97%   Wt Readings from Last 3 Encounters:  07/16/18 135 lb (61.2 kg)  04/16/18 134 lb 1.6 oz (60.8 kg)  01/25/18 136 lb 6.4 oz (61.9 kg)    Physical Exam Vitals signs and nursing note reviewed.  Constitutional:      General: He is awake. He is not in acute distress.    Appearance: He is well-developed. He is not ill-appearing.  HENT:     Head: Normocephalic and atraumatic.     Right Ear: Hearing normal. No drainage.     Left Ear: Hearing normal. No drainage.     Mouth/Throat:     Pharynx: Uvula midline.  Eyes:     General: Lids are normal.        Right eye: No discharge.        Left eye: No discharge.     Conjunctiva/sclera: Conjunctivae normal.     Pupils: Pupils are equal, round, and reactive to light.  Neck:     Musculoskeletal: Normal range of motion and neck supple.     Vascular: No carotid bruit.     Trachea: Trachea normal.  Cardiovascular:  Rate and Rhythm: Normal rate and regular rhythm.     Heart sounds: Normal heart sounds, S1 normal and S2 normal. No murmur. No gallop.   Pulmonary:     Effort: Pulmonary effort is normal. No accessory muscle usage or respiratory distress.     Breath sounds: Normal breath sounds.  Abdominal:     General: Bowel sounds are normal.     Palpations: Abdomen is soft. There is no hepatomegaly or splenomegaly.  Musculoskeletal:  Normal range of motion.     Right lower leg: No edema.     Left lower leg: No edema.  Skin:    General: Skin is warm and dry.     Capillary Refill: Capillary refill takes less than 2 seconds.     Findings: No rash.  Neurological:     Mental Status: He is alert and oriented to person, place, and time.     Deep Tendon Reflexes: Reflexes are normal and symmetric.  Psychiatric:        Mood and Affect: Mood normal.        Behavior: Behavior normal. Behavior is cooperative.        Thought Content: Thought content normal.        Judgment: Judgment normal.   DM Foot Exam Color: normal Sensation Monofilament:decreased bilaterally; right 6/10 and left 7/10 Sensation Vibration: decreased bilaterally Circulation: Pulses normal right and left pedal and posterial tibial  Lesions: normal dorsal, plantar, toe and distal   Results for orders placed or performed in visit on 07/14/18  Microalbumin, Urine Waived  Result Value Ref Range   Microalb, Ur Waived 10 0 - 19 mg/L   Creatinine, Urine Waived 50 10 - 300 mg/dL   Microalb/Creat Ratio 30-300 (H) <30 mg/g  Bayer DCA Hb A1c Waived  Result Value Ref Range   HB A1C (BAYER DCA - WAIVED) 7.3 (H) <7.0 %      Assessment & Plan:   Problem List Items Addressed This Visit      Cardiovascular and Mediastinum   Essential hypertension, benign    Chronic, stable with BP at goal.  Continue current medication regimen.  CMP today.      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  A1C today 7.5%.  Will increase Metformin to 1000 MG BID (he just picked up 500 MG pills and will alert provider when these are out so new script for 1000 MG pills can be sent) + continue Glipizide.  Continue to recommend he check BS at home daily to help assist to determine when his BS is elevated. Return in 3 months.      Relevant Orders   Bayer DCA Hb A1c Waived     Endocrine   Hyperlipidemia associated with  type 2 diabetes mellitus ([place]) - Primary    Chronic, ongoing.  Continue current medication regimen and adjust as needed.  CMP and lipid panel today.        Relevant Orders   Bayer DCA Hb A1c Waived   Comprehensive metabolic panel   Lipid Panel w/o Chol/HDL Ratio       Follow up plan: Return in about 3 months (around 01/27/2019) for T2DM, HTN/HLD.

## 2018-10-27 NOTE — Assessment & Plan Note (Signed)
Chronic, ongoing, has been tolerating Metformin well.  A1C today 7.5%.  Will increase Metformin to 1000 MG BID (he just picked up 500 MG pills and will alert provider when these are out so new script for 1000 MG pills can be sent) + continue Glipizide.  Continue to recommend he check BS at home daily to help assist to determine when his BS is elevated. Return in 3 months.

## 2018-10-28 LAB — COMPREHENSIVE METABOLIC PANEL
ALT: 22 IU/L (ref 0–44)
AST: 20 IU/L (ref 0–40)
Albumin/Globulin Ratio: 2.2 (ref 1.2–2.2)
Albumin: 4.6 g/dL (ref 3.7–4.7)
Alkaline Phosphatase: 128 IU/L — ABNORMAL HIGH (ref 39–117)
BUN/Creatinine Ratio: 13 (ref 10–24)
BUN: 13 mg/dL (ref 8–27)
Bilirubin Total: 0.5 mg/dL (ref 0.0–1.2)
CO2: 23 mmol/L (ref 20–29)
Calcium: 9.3 mg/dL (ref 8.6–10.2)
Chloride: 97 mmol/L (ref 96–106)
Creatinine, Ser: 0.99 mg/dL (ref 0.76–1.27)
GFR calc Af Amer: 86 mL/min/{1.73_m2} (ref >59)
GFR calc non Af Amer: 74 mL/min/{1.73_m2} (ref >59)
Globulin, Total: 2.1 g/dL (ref 1.5–4.5)
Glucose: 184 mg/dL — ABNORMAL HIGH (ref 65–99)
Potassium: 4.8 mmol/L (ref 3.5–5.2)
Sodium: 138 mmol/L (ref 134–144)
Total Protein: 6.7 g/dL (ref 6.0–8.5)

## 2018-10-28 LAB — LIPID PANEL W/O CHOL/HDL RATIO
Cholesterol, Total: 134 mg/dL (ref 100–199)
HDL: 30 mg/dL — ABNORMAL LOW (ref >39)
LDL Calculated: 53 mg/dL (ref 0–99)
Triglycerides: 255 mg/dL — ABNORMAL HIGH (ref 0–149)
VLDL Cholesterol Cal: 51 mg/dL — ABNORMAL HIGH (ref 5–40)

## 2018-11-11 ENCOUNTER — Other Ambulatory Visit: Payer: Self-pay | Admitting: Nurse Practitioner

## 2018-11-11 NOTE — Telephone Encounter (Signed)
Please advise 

## 2018-11-28 ENCOUNTER — Other Ambulatory Visit: Payer: Self-pay | Admitting: Unknown Physician Specialty

## 2019-01-14 ENCOUNTER — Other Ambulatory Visit: Payer: Self-pay

## 2019-01-14 DIAGNOSIS — Z20828 Contact with and (suspected) exposure to other viral communicable diseases: Secondary | ICD-10-CM | POA: Diagnosis not present

## 2019-01-14 DIAGNOSIS — Z20822 Contact with and (suspected) exposure to covid-19: Secondary | ICD-10-CM

## 2019-01-15 LAB — NOVEL CORONAVIRUS, NAA: SARS-CoV-2, NAA: DETECTED — AB

## 2019-01-17 ENCOUNTER — Telehealth: Payer: Self-pay | Admitting: Nurse Practitioner

## 2019-01-17 NOTE — Telephone Encounter (Signed)
Wife is wanting returning call from [place] about husband's Covid [phone]. Sorry they missed the call

## 2019-01-17 NOTE — Telephone Encounter (Signed)
Noted, will have nursing staff pass on info.

## 2019-01-17 NOTE — Telephone Encounter (Signed)
Patient notified of Jolene's message. Appointment scheduled for 01/31/19.

## 2019-01-17 NOTE — Telephone Encounter (Signed)
Called and LVM for patient to please return my call. 

## 2019-01-17 NOTE — Telephone Encounter (Signed)
Copied from [place] [id number]. Topic: General - Inquiry >> Jan 17, 2019 11:23 AM Mathis Bud wrote: Reason for CRM: Patient wife wanted to call to let PCP know patient was tested positive for covid.  Patients wife wants to know if PCP needs to call him anything or what the next steps are. Call back [phone]

## 2019-01-19 ENCOUNTER — Ambulatory Visit: Payer: Self-pay

## 2019-01-19 NOTE — Telephone Encounter (Addendum)
Spoke to patient's wife via telephone, she reports he has a productive cough.  No SOB, CP, fever at this time.  Recommend urgent care or ER setting for face to face, they prefer virtual visit and wish not to be seen elsewhere at this time.  Christan does anyone have an opening tomorrow for virtual visit with this patient?  I did recommend if worsening symptoms to immediately be seen in ER setting.  Discussed worsening symptoms to be seen for at length

## 2019-01-19 NOTE — Telephone Encounter (Signed)
Pt's. Wife called to report pt. Is positive for COVID and he has had increased cough with expectoration of thick,dark green mucus.  (pt. Gave permission to talk with his wife re: his symptoms)  Stated his cough is worse today.  Pt. Denied any shortness of breath or chest pain.  Wife stated pt. denied any other symptoms.  Temp. 97.7; checking regularly.  Care advice given per protocol.  Reported she is giving him Mucinex.  Advised on importance of hydration to thin the secretions.  Advised that will send note to office to schedule Virtual visit / or to request further recommendations for PCP.   Wife advised on when to take pt. To ER.   Verb. Understanding and agreed with plan.   Reason for Disposition . [1] Continuous (nonstop) coughing interferes with work or school AND [2] no improvement using cough treatment per protocol  Answer Assessment - Initial Assessment Questions 1. COVID-19 DIAGNOSIS: "Who made your Coronavirus (COVID-19) diagnosis?" "Was it confirmed by a positive lab test?" If not diagnosed by a HCP, ask "Are there lots of cases (community spread) where you live?" (See public health department website, if unsure)     Positive for COVID 2. ONSET: "When did the COVID-19 symptoms start?"      Cough started 10/10  3. WORST SYMPTOM: "What is your worst symptom?" (e.g., cough, fever, shortness of breath, muscle aches)    Coughing has worsened / expectorating dark green mucus 4. COUGH: "Do you have a cough?" If so, ask: "How bad is the cough?"      Yes, see above  5. FEVER: "Do you have a fever?" If so, ask: "What is your temperature, how was it measured, and when did it start?"    97.7 6. RESPIRATORY STATUS: "Describe your breathing?" (e.g., shortness of breath, wheezing, unable to speak)      Denied shortness of breath 7. BETTER-SAME-WORSE: "Are you getting better, staying the same or getting worse compared to yesterday?"  If getting worse, ask, "In what way?"    Having trouble coughing up  dark green mucus 8. HIGH RISK DISEASE: "Do you have any chronic medical problems?" (e.g., asthma, heart or lung disease, weak immune system, etc.)     HBP  9. PREGNANCY: "Is there any chance you are pregnant?" "When was your last menstrual period?"     N/a  10. OTHER SYMPTOMS: "Do you have any other symptoms?"  (e.g., chills, fatigue, headache, loss of smell or taste, muscle pain, sore throat)       Denied any other symptoms  Protocols used: CORONAVIRUS (COVID-19) DIAGNOSED OR SUSPECTED-A-AH

## 2019-01-19 NOTE — Telephone Encounter (Signed)
Pt scheduled for tomorrow with Malachy Mood.

## 2019-01-20 ENCOUNTER — Encounter: Payer: Self-pay | Admitting: Unknown Physician Specialty

## 2019-01-20 ENCOUNTER — Other Ambulatory Visit: Payer: Self-pay

## 2019-01-20 ENCOUNTER — Ambulatory Visit (INDEPENDENT_AMBULATORY_CARE_PROVIDER_SITE_OTHER): Payer: Medicare HMO | Admitting: Unknown Physician Specialty

## 2019-01-20 DIAGNOSIS — J018 Other acute sinusitis: Secondary | ICD-10-CM | POA: Diagnosis not present

## 2019-01-20 MED ORDER — AZITHROMYCIN 250 MG PO TABS: ORAL_TABLET | ORAL | 0 refills | Status: DC

## 2019-01-20 NOTE — Progress Notes (Signed)
There were no vitals taken for this visit.   Subjective:    Patient ID: Matthew Wolf, male    DOB: Aug 15, 1945, 73 y.o.   MRN: [id number]  HPI: Matthew Wolf is a 73 y.o. male  Chief Complaint  Patient presents with  . URI    pt states he tested positive for COVID on Monday, states he had a productive cough yesterday, states phlegm had a yellow color. States he has been taking OTC meds but wonders if he needs something stronger   Cough This is a new problem. The current episode started in the past 7 days. The problem has been gradually worsening. The cough is productive of purulent sputum. Pertinent negatives include no chest pain, chills, ear congestion, ear pain, fever, headaches, heartburn, hemoptysis, myalgias, nasal congestion, postnasal drip, rash, rhinorrhea, sore throat, shortness of breath, sweats, weight loss or wheezing. Nothing aggravates the symptoms. He has tried prescription cough suppressant for the symptoms. The treatment provided moderate relief.    . This visit was completed via telephone due to the restrictions of the COVID-19 pandemic. All issues as above were discussed and addressed but no physical exam was performed. If it was felt that the patient should be evaluated in the office, they were directed there. The patient verbally consented to this visit. Patient was unable to complete an audio/visual visit due to Lack of equipment. Due to the catastrophic nature of the COVID-19 pandemic, this visit was done through audio contact only. . Location of the patient: home . Location of the provider: work . Those involved with this call:  . Provider: Kathrine Haddock, DNP . CMA: Yvonna Alanis, CMA . Front Desk/Registration: Jill Side  . Time spent on call: 10 minutes on the phone discussing health concerns. 10 minutes total spent in review of patient's record and preparation of their chart.    Relevant past medical, surgical, family and social history reviewed and  updated as indicated. Interim medical history since our last visit reviewed. Allergies and medications reviewed and updated.  Review of Systems  Constitutional: Negative for chills, fever and weight loss.  HENT: Negative for ear pain, postnasal drip, rhinorrhea and sore throat.   Respiratory: Positive for cough. Negative for hemoptysis, shortness of breath and wheezing.   Cardiovascular: Negative for chest pain.  Gastrointestinal: Negative for heartburn.  Musculoskeletal: Negative for myalgias.  Skin: Negative for rash.  Neurological: Negative for headaches.    Per HPI unless specifically indicated above     Objective:    There were no vitals taken for this visit.  Wt Readings from Last 3 Encounters:  07/16/18 135 lb (61.2 kg)  04/16/18 134 lb 1.6 oz (60.8 kg)  01/25/18 136 lb 6.4 oz (61.9 kg)    Physical Exam Neurological:     Mental Status: He is alert and oriented to person, place, and time.  Psychiatric:        Mood and Affect: Mood normal.        Thought Content: Thought content normal.     Results for orders placed or performed in visit on 01/14/19  Novel Coronavirus, NAA ([facility])   Specimen: Oropharyngeal(OP) collection in vial transport medium   OROPHARYNGEA  TESTING  Result Value Ref Range   SARS-CoV-2, NAA Detected (A) Not Detected      Assessment & Plan:   Problem List Items Addressed This Visit    None    Visit Diagnoses    Acute non-recurrent sinusitis of other sinus    -  Primary   Post viral infection (Covid).  Will rx Z pack.  Pt instructed to not work until after quarantine (3 days after last symptom and 14 days after diagnosis).    Relevant Medications   azithromycin (ZITHROMAX Z-PAK) 250 MG tablet       Follow up plan: Return if symptoms worsen or fail to improve.

## 2019-01-24 ENCOUNTER — Inpatient Hospital Stay (HOSPITAL_COMMUNITY)
Admission: AD | Admit: 2019-01-24 | Discharge: 2019-01-29 | DRG: 177 | Disposition: A | Discharge status: Home / Self Care | Payer: Medicare HMO | Source: Other Acute Inpatient Hospital | Attending: Internal Medicine | Admitting: Internal Medicine

## 2019-01-24 ENCOUNTER — Emergency Department
Admission: EM | Admit: 2019-01-24 | Discharge: 2019-01-24 | Disposition: A | Discharge status: Other Acute Inpatient Hospital | Payer: Medicare HMO | Attending: Student | Admitting: Student

## 2019-01-24 ENCOUNTER — Ambulatory Visit: Payer: Medicare HMO | Admitting: Family Medicine

## 2019-01-24 ENCOUNTER — Emergency Department: Payer: Medicare HMO

## 2019-01-24 ENCOUNTER — Other Ambulatory Visit: Payer: Self-pay

## 2019-01-24 ENCOUNTER — Encounter: Payer: Self-pay | Admitting: Emergency Medicine

## 2019-01-24 DIAGNOSIS — Z87891 Personal history of nicotine dependence: Secondary | ICD-10-CM | POA: Diagnosis not present

## 2019-01-24 DIAGNOSIS — R0689 Other abnormalities of breathing: Secondary | ICD-10-CM | POA: Diagnosis not present

## 2019-01-24 DIAGNOSIS — E860 Dehydration: Secondary | ICD-10-CM | POA: Diagnosis present

## 2019-01-24 DIAGNOSIS — E871 Hypo-osmolality and hyponatremia: Secondary | ICD-10-CM | POA: Diagnosis not present

## 2019-01-24 DIAGNOSIS — R531 Weakness: Secondary | ICD-10-CM

## 2019-01-24 DIAGNOSIS — E1142 Type 2 diabetes mellitus with diabetic polyneuropathy: Secondary | ICD-10-CM | POA: Diagnosis present

## 2019-01-24 DIAGNOSIS — E785 Hyperlipidemia, unspecified: Secondary | ICD-10-CM | POA: Diagnosis present

## 2019-01-24 DIAGNOSIS — E1122 Type 2 diabetes mellitus with diabetic chronic kidney disease: Secondary | ICD-10-CM | POA: Diagnosis present

## 2019-01-24 DIAGNOSIS — N183 Chronic kidney disease, stage 3 unspecified: Secondary | ICD-10-CM | POA: Insufficient documentation

## 2019-01-24 DIAGNOSIS — I129 Hypertensive chronic kidney disease with stage 1 through stage 4 chronic kidney disease, or unspecified chronic kidney disease: Secondary | ICD-10-CM | POA: Diagnosis present

## 2019-01-24 DIAGNOSIS — Z833 Family history of diabetes mellitus: Secondary | ICD-10-CM | POA: Diagnosis not present

## 2019-01-24 DIAGNOSIS — Z7982 Long term (current) use of aspirin: Secondary | ICD-10-CM

## 2019-01-24 DIAGNOSIS — I2510 Atherosclerotic heart disease of native coronary artery without angina pectoris: Secondary | ICD-10-CM | POA: Diagnosis present

## 2019-01-24 DIAGNOSIS — J984 Other disorders of lung: Secondary | ICD-10-CM | POA: Diagnosis not present

## 2019-01-24 DIAGNOSIS — N182 Chronic kidney disease, stage 2 (mild): Secondary | ICD-10-CM | POA: Diagnosis present

## 2019-01-24 DIAGNOSIS — Z951 Presence of aortocoronary bypass graft: Secondary | ICD-10-CM

## 2019-01-24 DIAGNOSIS — Z79899 Other long term (current) drug therapy: Secondary | ICD-10-CM | POA: Diagnosis not present

## 2019-01-24 DIAGNOSIS — J1282 Pneumonia due to coronavirus disease 2019: Secondary | ICD-10-CM | POA: Diagnosis present

## 2019-01-24 DIAGNOSIS — N400 Enlarged prostate without lower urinary tract symptoms: Secondary | ICD-10-CM | POA: Diagnosis present

## 2019-01-24 DIAGNOSIS — J1289 Other viral pneumonia: Secondary | ICD-10-CM | POA: Diagnosis not present

## 2019-01-24 DIAGNOSIS — Z23 Encounter for immunization: Secondary | ICD-10-CM | POA: Diagnosis not present

## 2019-01-24 DIAGNOSIS — Z803 Family history of malignant neoplasm of breast: Secondary | ICD-10-CM

## 2019-01-24 DIAGNOSIS — Z8249 Family history of ischemic heart disease and other diseases of the circulatory system: Secondary | ICD-10-CM | POA: Diagnosis not present

## 2019-01-24 DIAGNOSIS — Z801 Family history of malignant neoplasm of trachea, bronchus and lung: Secondary | ICD-10-CM | POA: Diagnosis not present

## 2019-01-24 DIAGNOSIS — E1165 Type 2 diabetes mellitus with hyperglycemia: Secondary | ICD-10-CM | POA: Diagnosis present

## 2019-01-24 DIAGNOSIS — J9601 Acute respiratory failure with hypoxia: Secondary | ICD-10-CM | POA: Diagnosis not present

## 2019-01-24 DIAGNOSIS — J9600 Acute respiratory failure, unspecified whether with hypoxia or hypercapnia: Secondary | ICD-10-CM | POA: Diagnosis present

## 2019-01-24 DIAGNOSIS — I440 Atrioventricular block, first degree: Secondary | ICD-10-CM | POA: Diagnosis not present

## 2019-01-24 DIAGNOSIS — Z7984 Long term (current) use of oral hypoglycemic drugs: Secondary | ICD-10-CM

## 2019-01-24 DIAGNOSIS — I152 Hypertension secondary to endocrine disorders: Secondary | ICD-10-CM | POA: Diagnosis present

## 2019-01-24 DIAGNOSIS — Z825 Family history of asthma and other chronic lower respiratory diseases: Secondary | ICD-10-CM | POA: Diagnosis not present

## 2019-01-24 DIAGNOSIS — U071 COVID-19: Principal | ICD-10-CM | POA: Diagnosis present

## 2019-01-24 DIAGNOSIS — I252 Old myocardial infarction: Secondary | ICD-10-CM | POA: Insufficient documentation

## 2019-01-24 DIAGNOSIS — R0902 Hypoxemia: Secondary | ICD-10-CM | POA: Diagnosis not present

## 2019-01-24 DIAGNOSIS — I10 Essential (primary) hypertension: Secondary | ICD-10-CM | POA: Diagnosis present

## 2019-01-24 DIAGNOSIS — E1159 Type 2 diabetes mellitus with other circulatory complications: Secondary | ICD-10-CM | POA: Diagnosis present

## 2019-01-24 LAB — COMPREHENSIVE METABOLIC PANEL
ALT: 26 U/L (ref 0–44)
AST: 34 U/L (ref 15–41)
Albumin: 3.5 g/dL (ref 3.5–5.0)
Alkaline Phosphatase: 106 U/L (ref 38–126)
Anion gap: 12 (ref 5–15)
BUN: 30 mg/dL — ABNORMAL HIGH (ref 8–23)
CO2: 20 mmol/L — ABNORMAL LOW (ref 22–32)
Calcium: 8.5 mg/dL — ABNORMAL LOW (ref 8.9–10.3)
Chloride: 100 mmol/L (ref 98–111)
Creatinine, Ser: 1.06 mg/dL (ref 0.61–1.24)
GFR calc Af Amer: >60 mL/min (ref >60)
GFR calc non Af Amer: >60 mL/min (ref >60)
Glucose, Bld: 195 mg/dL — ABNORMAL HIGH (ref 70–99)
Potassium: 5 mmol/L (ref 3.5–5.1)
Sodium: 132 mmol/L — ABNORMAL LOW (ref 135–145)
Total Bilirubin: 0.8 mg/dL (ref 0.3–1.2)
Total Protein: 7.2 g/dL (ref 6.5–8.1)

## 2019-01-24 LAB — CBC
HCT: 39.7 % (ref 39.0–52.0)
Hemoglobin: 13.6 g/dL (ref 13.0–17.0)
MCH: 31.9 pg (ref 26.0–34.0)
MCHC: 34.3 g/dL (ref 30.0–36.0)
MCV: 93 fL (ref 80.0–100.0)
Platelets: 208 10*3/uL (ref 150–400)
RBC: 4.27 MIL/uL (ref 4.22–5.81)
RDW: 12.7 % (ref 11.5–15.5)
WBC: 8.4 10*3/uL (ref 4.0–10.5)
nRBC: 0 % (ref 0.0–0.2)

## 2019-01-24 LAB — TYPE AND SCREEN
ABO/RH(D): A POS
Antibody Screen: NEGATIVE

## 2019-01-24 LAB — FIBRIN DERIVATIVES D-DIMER (ARMC ONLY): Fibrin derivatives D-dimer (ARMC): 1461.5 ng/mL (FEU) — ABNORMAL HIGH (ref 0.00–499.00)

## 2019-01-24 LAB — C-REACTIVE PROTEIN: CRP: 9.7 mg/dL — ABNORMAL HIGH (ref <1.0)

## 2019-01-24 MED ORDER — ASPIRIN EC 81 MG PO TBEC
81.0000 mg | DELAYED_RELEASE_TABLET | Freq: Every day | ORAL | Status: DC
  Administered 2019-01-24 – 2019-01-29 (×6): 81 mg via ORAL
  Filled 2019-01-24 (×6): qty 1

## 2019-01-24 MED ORDER — SODIUM CHLORIDE 0.9% FLUSH: 3.0000 mL | INTRAVENOUS | Status: DC | PRN

## 2019-01-24 MED ORDER — SODIUM CHLORIDE 0.9 % IV SOLN
200.0000 mg | Freq: Once | INTRAVENOUS | Status: AC
  Administered 2019-01-24: 200 mg via INTRAVENOUS
  Filled 2019-01-24: qty 40

## 2019-01-24 MED ORDER — NORTRIPTYLINE HCL 25 MG PO CAPS: 25.0000 mg | ORAL_CAPSULE | Freq: Two times a day (BID) | ORAL | Status: DC

## 2019-01-24 MED ORDER — ATORVASTATIN CALCIUM 20 MG PO TABS: 80.0000 mg | ORAL_TABLET | Freq: Every day | ORAL | Status: DC

## 2019-01-24 MED ORDER — ACETAMINOPHEN 325 MG PO TABS: 650.0000 mg | ORAL_TABLET | Freq: Four times a day (QID) | ORAL | Status: DC | PRN

## 2019-01-24 MED ORDER — ASPIRIN EC 325 MG PO TBEC
325.0000 mg | DELAYED_RELEASE_TABLET | Freq: Every day | ORAL | Status: DC
  Administered 2019-01-24: 325 mg via ORAL
  Filled 2019-01-24: qty 1

## 2019-01-24 MED ORDER — INSULIN ASPART 100 UNIT/ML ~~LOC~~ SOLN
0.0000 [IU] | Freq: Three times a day (TID) | SUBCUTANEOUS | Status: DC
  Administered 2019-01-25: 09:00:00 7 [IU] via SUBCUTANEOUS

## 2019-01-24 MED ORDER — ZINC SULFATE 220 (50 ZN) MG PO CAPS
220.0000 mg | ORAL_CAPSULE | Freq: Every day | ORAL | Status: DC
  Administered 2019-01-24 – 2019-01-29 (×6): 220 mg via ORAL
  Filled 2019-01-24 (×6): qty 1

## 2019-01-24 MED ORDER — IPRATROPIUM BROMIDE HFA 17 MCG/ACT IN AERS
2.0000 | INHALATION_SPRAY | Freq: Four times a day (QID) | RESPIRATORY_TRACT | Status: DC
  Administered 2019-01-24 – 2019-01-29 (×20): 2 via RESPIRATORY_TRACT
  Filled 2019-01-24 (×2): qty 12.9

## 2019-01-24 MED ORDER — ENOXAPARIN SODIUM 30 MG/0.3ML ~~LOC~~ SOLN
30.0000 mg | Freq: Two times a day (BID) | SUBCUTANEOUS | Status: DC
  Administered 2019-01-24 – 2019-01-25 (×2): 30 mg via SUBCUTANEOUS
  Filled 2019-01-24 (×2): qty 0.3

## 2019-01-24 MED ORDER — METHYLPREDNISOLONE SODIUM SUCC 40 MG IJ SOLR
40.0000 mg | INTRAMUSCULAR | Status: DC
  Administered 2019-01-24: 18:00:00 40 mg via INTRAVENOUS
  Filled 2019-01-24: qty 1

## 2019-01-24 MED ORDER — VITAMIN C 500 MG PO TABS
500.0000 mg | ORAL_TABLET | Freq: Every day | ORAL | Status: DC
  Administered 2019-01-24 – 2019-01-29 (×6): 500 mg via ORAL
  Filled 2019-01-24 (×6): qty 1

## 2019-01-24 MED ORDER — ACETAMINOPHEN 500 MG PO TABS
1000.0000 mg | ORAL_TABLET | Freq: Once | ORAL | Status: AC
  Administered 2019-01-24: 1000 mg via ORAL
  Filled 2019-01-24: qty 2

## 2019-01-24 MED ORDER — SODIUM CHLORIDE 0.9% FLUSH
3.0000 mL | Freq: Two times a day (BID) | INTRAVENOUS | Status: DC
  Administered 2019-01-24: 23:00:00 3 mL via INTRAVENOUS

## 2019-01-24 MED ORDER — BENZONATATE 100 MG PO CAPS: 100.0000 mg | ORAL_CAPSULE | Freq: Three times a day (TID) | ORAL | Status: DC | PRN

## 2019-01-24 MED ORDER — SODIUM CHLORIDE 0.9 % IV SOLN
200.0000 mg | Freq: Once | INTRAVENOUS | Status: DC
  Filled 2019-01-24: qty 40

## 2019-01-24 MED ORDER — SODIUM CHLORIDE 0.9 % IV SOLN
100.0000 mg | INTRAVENOUS | Status: AC
  Administered 2019-01-25 – 2019-01-28 (×4): 100 mg via INTRAVENOUS
  Filled 2019-01-24 (×4): qty 20

## 2019-01-24 MED ORDER — SODIUM CHLORIDE 0.9 % IV SOLN: 250.0000 mL | INTRAVENOUS | Status: DC | PRN

## 2019-01-24 MED ORDER — ENOXAPARIN SODIUM 40 MG/0.4ML ~~LOC~~ SOLN: 40.0000 mg | Freq: Two times a day (BID) | SUBCUTANEOUS | Status: DC

## 2019-01-24 MED ORDER — GUAIFENESIN-DM 100-10 MG/5ML PO SYRP
10.0000 mL | ORAL_SOLUTION | ORAL | Status: DC | PRN
  Administered 2019-01-25 – 2019-01-27 (×3): 10 mL via ORAL
  Filled 2019-01-24 (×3): qty 10

## 2019-01-24 MED ORDER — ONDANSETRON HCL 4 MG/2ML IJ SOLN: 4.0000 mg | Freq: Four times a day (QID) | INTRAMUSCULAR | Status: DC | PRN

## 2019-01-24 MED ORDER — SODIUM CHLORIDE 0.9 % IV BOLUS
500.0000 mL | Freq: Once | INTRAVENOUS | Status: AC
  Administered 2019-01-24: 500 mL via INTRAVENOUS

## 2019-01-24 MED ORDER — ONDANSETRON HCL 4 MG PO TABS: 4.0000 mg | ORAL_TABLET | Freq: Four times a day (QID) | ORAL | Status: DC | PRN

## 2019-01-24 MED ORDER — BENZONATATE 100 MG PO CAPS
100.0000 mg | ORAL_CAPSULE | Freq: Once | ORAL | Status: AC
  Administered 2019-01-24: 100 mg via ORAL
  Filled 2019-01-24: qty 1

## 2019-01-24 MED ORDER — HYDROCOD POLST-CPM POLST ER 10-8 MG/5ML PO SUER
5.0000 mL | Freq: Two times a day (BID) | ORAL | Status: DC | PRN
  Administered 2019-01-26 (×2): 5 mL via ORAL
  Filled 2019-01-24 (×2): qty 5

## 2019-01-24 MED ORDER — METHYLPREDNISOLONE SODIUM SUCC 40 MG IJ SOLR
0.5000 mg/kg | Freq: Two times a day (BID) | INTRAMUSCULAR | Status: DC
  Administered 2019-01-25: 28.8 mg via INTRAVENOUS
  Filled 2019-01-24: qty 1

## 2019-01-24 MED ORDER — LACTATED RINGERS IV SOLN
INTRAVENOUS | Status: DC
  Administered 2019-01-24: 18:00:00 via INTRAVENOUS

## 2019-01-24 NOTE — ED Provider Notes (Signed)
[facility] Emergency Department Provider Note  ____________________________________________   First MD Initiated Contact with Patient 01/24/19 1502     (approximate)  I have reviewed the triage vital signs and the nursing notes.  History  Chief Complaint Weakness    HPI Matthew Wolf is a 73 y.o. male with a history of CAD, CKD, known COVID positive (10/9)  who presents for generalized weakness.  Patient states he was tested on 10/9 due to a close exposure at work, but at that time was not experiencing any symptoms.  Several days later (around 10/14) he developed generalized weakness, along with decreased appetite and changes to taste and smell.  Over the last several days his appetite has improved somewhat, but he feels like his generalized weakness is worsening. He reports a dry cough.   He denies any significant shortness of breath at rest, but does feel fairly fatigued on exertion.  No chest pain, vomiting, diarrhea, fevers, or chills.   Past Medical Hx Past Medical History:  Diagnosis Date  . BPH (benign prostatic hypertrophy)   . CKD (chronic kidney disease) stage 3, GFR 30-59 ml/min   . Diabetes ([place])   . Diabetic peripheral neuropathy ([place])   . HBP (high blood pressure)   . Heart trouble   . Hyperlipidemia   . Hypertensive kidney disease   . MI (myocardial infarction) ([place]) 1991, 1996    Problem List Patient Active Problem List   Diagnosis Date Noted  . Prostate cancer screening 03/16/2015  . Coronary artery disease 11/30/2014  . Essential hypertension, benign   . Hyperlipidemia associated with type 2 diabetes mellitus ([place])   . 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 Diabetic peripheral neuropathy [facility])     Past Surgical Hx Past Surgical History:  Procedure Laterality Date  . ANGIOPLASTY     x 2  . CARDIAC SURGERY    . CATARACT EXTRACTION, BILATERAL  06/2015  . CORONARY ARTERY BYPASS GRAFT  1991  . HERNIA REPAIR       Medications Prior to Admission medications   Medication Sig Start Date End Date Taking? Authorizing Provider  aspirin EC 325 MG tablet Take 325 mg by mouth daily.    [provider]  atenolol (TENORMIN) 50 MG tablet Take 50 mg by mouth.  03/20/13   [provider]  atorvastatin (LIPITOR) 80 MG tablet Take 80 mg by mouth at bedtime.  03/20/13   [provider]  azithromycin (ZITHROMAX Z-PAK) 250 MG tablet As directed 01/20/19   Kathrine Haddock, NP  glipiZIDE (GLUCOTROL XL) 10 MG 24 hr tablet TAKE 1 TABLET (10 MG TOTAL) BY MOUTH DAILY WITH BREAKFAST. 11/29/18   Cannady, Henrine Screws T, NP  metFORMIN (GLUCOPHAGE) 500 MG tablet Take 1 tablet (500 mg total) by mouth 2 (two) times daily with a meal. 07/16/18   Cannady, Jolene T, NP  nortriptyline (PAMELOR) 25 MG capsule TAKE 1 CAPSULE (25 MG TOTAL) BY MOUTH 2 (TWO) TIMES DAILY. 11/11/18   Cannady, Henrine Screws T, NP  Omega-3 Fatty Acids (FISH OIL PO) Take by mouth.    [provider]  ramipril (ALTACE) 5 MG capsule TAKE 1 CAPSULE (5 MG TOTAL) BY MOUTH EVERY MORNING. 07/12/14   [provider]    Allergies Patient has no known allergies.  Family Hx Family History  Problem Relation Age of Onset  . Diabetes Mother   . Heart disease Mother   . Hypertension Mother   . Emphysema  Father   . Lung cancer Father   . Breast cancer Sister   . Heart disease Maternal Aunt   . Stroke Neg Hx     Social Hx Social History   Tobacco Use  . Smoking status: Former Smoker    Types: Cigarettes    Quit date: 04/07/1984    Years since quitting: 34.8  . Smokeless tobacco: Never Used  Substance Use Topics  . Alcohol use: No  . Drug use: No     Review of Systems  Constitutional: Negative for fever, chills. + generalized weakness Eyes: Negative for visual changes. ENT: Negative for sore throat. Cardiovascular: Negative for chest pain. Respiratory: Negative for shortness of breath. + cough Gastrointestinal: Negative for  nausea, vomiting.  Genitourinary: Negative for dysuria. Musculoskeletal: Negative for leg swelling. Skin: Negative for rash. Neurological: Negative for for headaches.   Physical Exam  Vital Signs: ED Triage Vitals  Enc Vitals Group     BP 01/24/19 1456 (!) 152/86     Pulse Rate 01/24/19 1456 77     Resp 01/24/19 1456 (!) 30     Temp 01/24/19 1456 97.6 F (36.4 C)     Temp Source 01/24/19 1456 Oral     SpO2 01/24/19 1456 96 %     Weight 01/24/19 1450 140 lb (63.5 kg)     Height 01/24/19 1450 5\' 7"  (1.702 m)     Head Circumference --      Peak Flow --      Pain Score 01/24/19 1450 0     Pain Loc --      Pain Edu? --      Excl. in GC? --     Constitutional: Alert and oriented.  Appears fatigued. Head: Normocephalic. Atraumatic. Eyes: Conjunctivae clear. Sclera anicteric. Nose: No congestion. No rhinorrhea. Mouth/Throat: Mucous membranes are somewhat dry.  Neck: No stridor.   Cardiovascular: Normal rate, regular rhythm. Extremities well perfused. Respiratory: RR mid to high 20s. Speaking full sentences.  Oxygen mid to high 90's on room air.  Oxygen drops to high 80's on ambulatory walk, improved once back at rest. Gastrointestinal:  Non-distended.  Musculoskeletal: No lower extremity edema. No deformities. Neurologic:  Normal speech and language. No gross focal neurologic deficits are appreciated.  Skin: Skin is warm, dry and intact. No rash noted. Psychiatric: Mood and affect are appropriate for situation.  EKG  Personally reviewed.   Rate: 75 Rhythm: sinus Axis: LAD Intervals: prolonged PR and QTc. IVCD leading to widened QRS PVC No STEMI    Radiology  XR: IMPRESSION:  Subtle bilateral airspace disease may represent pneumonia.    Procedures  Procedure(s) performed (including critical care):  Procedures   Initial Impression / Assessment and Plan / ED Course  73 y.o. male who presents to the ED for generalized weakness, in the setting of known  positive COVID test on 10/9.   Ddx: presentation likely related to his known COVID diagnosis, also consider anemia, electrolyte abnormality, UTI  Plan: labs, urine, XR, symptom control, reassess  X-ray reveals bilateral airspace disease likely representing pneumonia.  This is likely related to his COVID.  On his ambulatory challenge, he did desaturate to the high 80s, but improved once back at rest to mid to high 90s on room air.  He does appear fatigued.  Given he is on day 10 of illness and seems to be worsening, discussed the patient's clinical course with H[facility] Dr. [facility], who does recommend admission for steroids and antivirals.  Patient is agreeable  with this.  [facility]  Final Clinical Impression(s) / ED Diagnosis  Final diagnoses:  Weakness  COVID-19       Note:  This document was prepared using Dragon voice recognition software and may include unintentional dictation errors.   Miguel AschoffMonks,  L., MD 01/24/19 1725

## 2019-01-24 NOTE — Consult Note (Signed)
Pharmacy Antibiotic Note  Matthew Wolf is a 73 y.o. male admitted on 01/24/2019 with COVID.  Pharmacy has been consulted for Remdesivir dosing.  Per chart review, patient is on room air at 95%. Messaged Dr. Lala Lund, patient his oxygen is dropping below 89 on walking plus has infiltrates as well.   Plan: 1. Will order Remdesivir 200 mg x1 dose. Awaiting transfer to [place].   Height: 5\' 7"  (170.2 cm) Weight: 140 lb (63.5 kg) IBW/kg (Calculated) : 66.1  Temp (24hrs), Avg:97.6 F (36.4 C), Min:97.6 F (36.4 C), Max:97.6 F (36.4 C)  Recent Labs  Lab 01/24/19 1503 01/24/19 1557  WBC 8.4  --   CREATININE  --  1.06    Estimated Creatinine Clearance: 54.1 mL/min (by C-G formula based on SCr of 1.06 mg/dL).    No Known Allergies   Thank you for allowing pharmacy to be a part of this patient's care.  [name] 01/24/2019 8:13 PM

## 2019-01-24 NOTE — Progress Notes (Signed)
Pharmacy Brief Note   O:  ALT: 26  CXR:  bilateral airspace disease likely representing pneumonia  SpO2:   desaturate to the high 80s, but improved once back at rest to mid to high 90s on room air.   A/P:  Patient meets requirements for remdesivir therapy.  Will start  remdesivir 200 mg IV x 1  followed by 100 mg IV daily x 4 days.  Monitor ALT  Royetta Asal, PharmD, BCPS 01/24/2019 10:15 PM

## 2019-01-24 NOTE — H&P (Signed)
History and Physical    Matthew Wolf:[id number] DOB: 1946-03-13 DOA: 01/24/2019  PCP: [name], NP  Patient coming from: home  Chief Complaint:  sob  HPI: Matthew Wolf is a 73 y.o. male with medical history significant of ckd, dm, htn comes in with cough and sob covid pos on 10/9.  He currently denies sob, and is on RA w/ normal o2 sats.  Denies any n/v/d.  No chest pain or abd pain.  No le edema or swelling.  Pt referred for admission for pna on cxr caused by covid infection.  Review of Systems: As per HPI otherwise 10 point review of systems negative.   Past Medical History:  Diagnosis Date  . BPH (benign prostatic hypertrophy)   . CKD (chronic kidney disease) stage 3, GFR 30-59 ml/min   . Diabetes ([place])   . Diabetic peripheral neuropathy ([place])   . HBP (high blood pressure)   . Heart trouble   . Hyperlipidemia   . Hypertensive kidney disease   . MI (myocardial infarction) ([place]) [place]    Past Surgical History:  Procedure Laterality Date  . ANGIOPLASTY     x 2  . CARDIAC SURGERY    . CATARACT EXTRACTION, BILATERAL  06/2015  . CORONARY ARTERY BYPASS GRAFT  1991  . HERNIA REPAIR       reports that he quit smoking about 34 years ago. His smoking use included cigarettes. He has never used smokeless tobacco. He reports that he does not drink alcohol or use drugs.  No Known Allergies  Family History  Problem Relation Age of Onset  . Diabetes Mother   . Heart disease Mother   . Hypertension Mother   . Emphysema Father   . Lung cancer Father   . Breast cancer Sister   . Heart disease Maternal Aunt   . Stroke Neg Hx     Prior to Admission medications   Medication Sig Start Date End Date Taking? Authorizing Provider  aspirin EC 325 MG tablet Take 325 mg by mouth daily.    [provider]  atenolol (TENORMIN) 50 MG tablet Take 50 mg by mouth.  03/20/13   [provider]  atorvastatin (LIPITOR) 80 MG tablet Take 80 mg by mouth at  bedtime.  03/20/13   [provider]  glipiZIDE (GLUCOTROL XL) 10 MG 24 hr tablet TAKE 1 TABLET (10 MG TOTAL) BY MOUTH DAILY WITH BREAKFAST. 11/29/18   Cannady, Jolene T, NP  metFORMIN (GLUCOPHAGE) 500 MG tablet Take 1 tablet (500 mg total) by mouth 2 (two) times daily with a meal. Patient taking differently: Take 500 mg by mouth 2 (two) times daily with a meal. Pt states he takes 500mg  @ 1200, 1000mg  @ 1800 07/16/18   Cannady, Jolene T, NP  nortriptyline (PAMELOR) 25 MG capsule TAKE 1 CAPSULE (25 MG TOTAL) BY MOUTH 2 (TWO) TIMES DAILY. 11/11/18   Cannady, Henrine Screws T, NP  Omega-3 Fatty Acids (FISH OIL PO) Take by mouth.    [provider]  ramipril (ALTACE) 5 MG capsule TAKE 1 CAPSULE (5 MG TOTAL) BY MOUTH EVERY MORNING. 07/12/14   [provider]    Physical Exam: Vitals:   01/24/19 2234 01/24/19 2235  BP:  120/63  Pulse:  66  Resp:  (!) 27  Temp:  97.6 F (36.4 C)  TempSrc:  Oral  Weight: 57.8 kg 57.8 kg  Height: 5\' 4"  (1.626 m) 5\' 7"  (1.702 m)      Constitutional:  NAD, calm, comfortable Vitals:   01/24/19 2234 01/24/19 2235  BP:  120/63  Pulse:  66  Resp:  (!) 27  Temp:  97.6 F (36.4 C)  TempSrc:  Oral  Weight: 57.8 kg 57.8 kg  Height: 5\' 4"  (1.626 m) 5\' 7"  (1.702 m)   Eyes: PERRL, lids and conjunctivae normal ENMT: Mucous membranes are moist. Posterior pharynx clear of any exudate or lesions.Normal dentition.  Neck: normal, supple, no masses, no thyromegaly Respiratory: clear to auscultation bilaterally, no wheezing, no crackles. Normal respiratory effort. No accessory muscle use.  Cardiovascular: Regular rate and rhythm, no murmurs / rubs / gallops. No extremity edema. 2+ pedal pulses. No carotid bruits.  Abdomen: no tenderness, no masses palpated. No hepatosplenomegaly. Bowel sounds positive.  Musculoskeletal: no clubbing / cyanosis. No joint deformity upper and lower extremities. Good ROM, no contractures. Normal muscle tone.  Skin: no rashes,  lesions, ulcers. No induration Neurologic: CN 2-12 grossly intact. Sensation intact, DTR normal. Strength 5/5 in all 4.  Psychiatric: Normal judgment and insight. Alert and oriented x 3. Normal mood.    Labs on Admission: I have personally reviewed following labs and imaging studies  CBC: Recent Labs  Lab 01/24/19 1503  WBC 8.4  HGB 13.6  HCT 39.7  MCV 93.0  PLT 208   Basic Metabolic Panel: Recent Labs  Lab 01/24/19 1557  NA 132*  K 5.0  CL 100  CO2 20*  GLUCOSE 195*  BUN 30*  CREATININE 1.06  CALCIUM 8.5*   GFR: Estimated Creatinine Clearance: 49.2 mL/min (by C-G formula based on SCr of 1.06 mg/dL). Liver Function Tests: Recent Labs  Lab 01/24/19 1557  AST 34  ALT 26  ALKPHOS 106  BILITOT 0.8  PROT 7.2  ALBUMIN 3.5   No results for input(s): LIPASE, AMYLASE in the last 168 hours. No results for input(s): AMMONIA in the last 168 hours. Coagulation Profile: No results for input(s): INR, PROTIME in the last 168 hours. Cardiac Enzymes: No results for input(s): CKTOTAL, CKMB, CKMBINDEX, TROPONINI in the last 168 hours. BNP (last 3 results) No results for input(s): PROBNP in the last 8760 hours. HbA1C: No results for input(s): HGBA1C in the last 72 hours. CBG: No results for input(s): GLUCAP in the last 168 hours. Lipid Profile: No results for input(s): CHOL, HDL, LDLCALC, TRIG, CHOLHDL, LDLDIRECT in the last 72 hours. Thyroid Function Tests: No results for input(s): TSH, T4TOTAL, FREET4, T3FREE, THYROIDAB in the last 72 hours. Anemia Panel: No results for input(s): VITAMINB12, FOLATE, FERRITIN, TIBC, IRON, RETICCTPCT in the last 72 hours. Urine analysis: No results found for: COLORURINE, APPEARANCEUR, LABSPEC, PHURINE, GLUCOSEU, HGBUR, BILIRUBINUR, KETONESUR, PROTEINUR, UROBILINOGEN, NITRITE, LEUKOCYTESUR Sepsis Labs: !!!!!!!!!!!!!!!!!!!!!!!!!!!!!!!!!!!!!!!!!!!! @LABRCNTIP (procalcitonin:4,lacticidven:4) )No results found for this or any previous visit  (from the past 240 hour(s)).   Radiological Exams on Admission: Dg Chest Port 1 View  Result Date: 01/24/2019 CLINICAL DATA:  COVID-19 positive.  Weakness. EXAM: PORTABLE CHEST 1 VIEW COMPARISON:  08/16/2014 FINDINGS: Postop CABG.  Negative for heart failure. Subtle airspace disease is present in the right upper lobe and right lower lobe as well as the left lower lobe. No collapse. No significant pleural effusion. No acute skeletal abnormality. IMPRESSION: Subtle bilateral airspace disease may represent pneumonia. Recommend CT chest with contrast for further evaluation. Electronically Signed   By: M.D.   On: 01/24/2019 15:48   Old chart reviewed cxr reviewed bilateral opacities  Assessment/Plan 73 yo male with pna secondary to covid pna   Principal Problem:  Pneumonia due to COVID-19 virus- cxr with infiltrates bilaterally.  o2 nml at this time on RA.  Place on remdisivir, steroids.  Lovenox/vit c and zinc also ordered.  Vss.   Active Problems:   Acute respiratory failure due to COVID-19 [facility](HCC)- [facility], seems resolved at this time.  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      DVT prophylaxis:  lovenox  Code Status:  full Family Communication:  none Disposition Plan:  days Consults called:  none Admission status:  admission   Ruby Logiudice A MD [facility]  If 7PM-7AM, please contact night-coverage [email or web address] Password [email or web address]  01/24/2019, 11:37 PM

## 2019-01-24 NOTE — Progress Notes (Signed)
    Matthew Wolf, is a 73 y.o. male, DOB - 24-Oct-1945, ERD:[id number]  Who was diagnosed with COVID-19 infection in [place] ER on 01/14/2019 and sent home, now comes back with fatigue cough and exertional shortness of breath, chest x-ray positive, he has increased work of breathing.  Update to MedSurg, requested them to start IV fluids, IV steroids and remdesivir right away.  Have ordered CRP, D-dimer, type and screen.  If patient arrives here overnight and inflammatory markers are elevated convalescent plasma and Actemra can be considered depending on his degree of hypoxia and clinical situation.   Vitals:   01/24/19 1450 01/24/19 1456 01/24/19 1614 01/24/19 1630  BP:  (!) 152/86 116/89 130/68  Pulse:  77 79 74  Resp:  (!) 30 17 (!) 26  Temp:  97.6 F (36.4 C)    TempSrc:  Oral    SpO2:  96% 100% 98%  Weight: 63.5 kg     Height: 5\' 7"  (1.702 m)           Data Review   Micro Results No results found for this or any previous visit (from the past 240 hour(s)).  Radiology Reports Dg Chest Port 1 View  Result Date: 01/24/2019 CLINICAL DATA:  COVID-19 positive.  Weakness. EXAM: PORTABLE CHEST 1 VIEW COMPARISON:  08/16/2014 FINDINGS: Postop CABG.  Negative for heart failure. Subtle airspace disease is present in the right upper lobe and right lower lobe as well as the left lower lobe. No collapse. No significant pleural effusion. No acute skeletal abnormality. IMPRESSION: Subtle bilateral airspace disease may represent pneumonia. Recommend CT chest with contrast for further evaluation. Electronically Signed   By: Franchot Gallo M.D.   On: 01/24/2019 15:48    CBC Recent Labs  Lab 01/24/19 1503  WBC 8.4  HGB 13.6  HCT 39.7  PLT 208  MCV 93.0  MCH 31.9  MCHC 34.3  RDW 12.7    Chemistries  Recent Labs  Lab 01/24/19 1557  NA 132*  K 5.0  CL 100  CO2 20*  GLUCOSE 195*  BUN 30*  CREATININE  1.06  CALCIUM 8.5*  AST 34  ALT 26  ALKPHOS 106  BILITOT 0.8   ------------------------------------------------------------------------------------------------------------------ estimated creatinine clearance is 54.1 mL/min (by C-G formula based on SCr of 1.06 mg/dL). ------------------------------------------------------------------------------------------------------------------ No results for input(s): HGBA1C in the last 72 hours. ------------------------------------------------------------------------------------------------------------------ No results for input(s): CHOL, HDL, LDLCALC, TRIG, CHOLHDL, LDLDIRECT in the last 72 hours. ------------------------------------------------------------------------------------------------------------------ No results for input(s): TSH, T4TOTAL, T3FREE, THYROIDAB in the last 72 hours.  Invalid input(s): FREET3 ------------------------------------------------------------------------------------------------------------------ No results for input(s): VITAMINB12, FOLATE, FERRITIN, TIBC, IRON, RETICCTPCT in the last 72 hours.  Coagulation profile No results for input(s): INR, PROTIME in the last 168 hours.  No results for input(s): DDIMER in the last 72 hours.  Cardiac Enzymes No results for input(s): CKMB, TROPONINI, MYOGLOBIN in the last 168 hours.  Invalid input(s): CK ------------------------------------------------------------------------------------------------------------------ Invalid input(s): POCBNP

## 2019-01-24 NOTE — Consult Note (Signed)
ANTICOAGULATION CONSULT NOTE - Follow Up Consult  Pharmacy Consult for Enoxaparin Indication: VTE prophylaxis in the setting of COVID-19  No Known Allergies  Patient Measurements: Height: 5\' 7"  (170.2 cm) Weight: 140 lb (63.5 kg) IBW/kg (Calculated) : 66.1  Vital Signs: Temp: 97.6 F (36.4 C) (10/19 1456) Temp Source: Oral (10/19 1456) BP: 107/64 (10/19 1817) Pulse Rate: 73 (10/19 1817)  Labs: Recent Labs    01/24/19 1503 01/24/19 1557  HGB 13.6  --   HCT 39.7  --   PLT 208  --   CREATININE  --  1.06    Estimated Creatinine Clearance: 54.1 mL/min (by C-G formula based on SCr of 1.06 mg/dL).   Medications:  No anticoagulation prior to admission  Assessment: 73 y/o M with history of recent positive test for COVID-19 on 10/9 presenting to ED with generalized weakness, decreased appetite, alterations to taste and smell. No reported significant shortness of breath at rest but experiences fatigue upon exertion. Labs significant for elevated D-dimer of 1,461. Patient is on IV steroids with plan to start remdesivir. Pending transfer to [facility]. Pharmacy consulted for VTE prophylaxis.    Plan:  Considering elevated D-dimer in setting of COVID-19 will order intermediate-dose VTE prophylaxis of enoxaparin 40 mg BID.   [place] Resident 01/24/2019,7:17 PM

## 2019-01-24 NOTE — ED Triage Notes (Signed)
Here for generalized weakness. Denies pain. Woke up not feeling well today.  Dx with covid 10 days ago. No SHOB.  Unlabored currently. VSS

## 2019-01-24 NOTE — ED Notes (Signed)
Sent rainbow with T&S,SST, gray on ice,and a dark green to lab.

## 2019-01-25 ENCOUNTER — Encounter (HOSPITAL_COMMUNITY): Payer: Self-pay

## 2019-01-25 ENCOUNTER — Other Ambulatory Visit: Payer: Self-pay

## 2019-01-25 LAB — GLUCOSE, CAPILLARY
Glucose-Capillary: 235 mg/dL — ABNORMAL HIGH (ref 70–99)
Glucose-Capillary: 296 mg/dL — ABNORMAL HIGH (ref 70–99)
Glucose-Capillary: 348 mg/dL — ABNORMAL HIGH (ref 70–99)
Glucose-Capillary: 410 mg/dL — ABNORMAL HIGH (ref 70–99)
Glucose-Capillary: 445 mg/dL — ABNORMAL HIGH (ref 70–99)

## 2019-01-25 LAB — COMPREHENSIVE METABOLIC PANEL
ALT: 24 U/L (ref 0–44)
AST: 26 U/L (ref 15–41)
Albumin: 3.1 g/dL — ABNORMAL LOW (ref 3.5–5.0)
Alkaline Phosphatase: 95 U/L (ref 38–126)
Anion gap: 11 (ref 5–15)
BUN: 34 mg/dL — ABNORMAL HIGH (ref 8–23)
CO2: 17 mmol/L — ABNORMAL LOW (ref 22–32)
Calcium: 7.8 mg/dL — ABNORMAL LOW (ref 8.9–10.3)
Chloride: 101 mmol/L (ref 98–111)
Creatinine, Ser: 1.04 mg/dL (ref 0.61–1.24)
GFR calc Af Amer: >60 mL/min (ref >60)
GFR calc non Af Amer: >60 mL/min (ref >60)
Glucose, Bld: 381 mg/dL — ABNORMAL HIGH (ref 70–99)
Potassium: 4.7 mmol/L (ref 3.5–5.1)
Sodium: 129 mmol/L — ABNORMAL LOW (ref 135–145)
Total Bilirubin: 0.8 mg/dL (ref 0.3–1.2)
Total Protein: 6.3 g/dL — ABNORMAL LOW (ref 6.5–8.1)

## 2019-01-25 LAB — CBC WITH DIFFERENTIAL/PLATELET
Abs Immature Granulocytes: 0.31 10*3/uL — ABNORMAL HIGH (ref 0.00–0.07)
Basophils Absolute: 0 10*3/uL (ref 0.0–0.1)
Basophils Relative: 0 %
Eosinophils Absolute: 0 10*3/uL (ref 0.0–0.5)
Eosinophils Relative: 0 %
HCT: 35.5 % — ABNORMAL LOW (ref 39.0–52.0)
Hemoglobin: 12.1 g/dL — ABNORMAL LOW (ref 13.0–17.0)
Immature Granulocytes: 6 %
Lymphocytes Relative: 14 %
Lymphs Abs: 0.8 10*3/uL (ref 0.7–4.0)
MCH: 32.4 pg (ref 26.0–34.0)
MCHC: 34.1 g/dL (ref 30.0–36.0)
MCV: 94.9 fL (ref 80.0–100.0)
Monocytes Absolute: 0.3 10*3/uL (ref 0.1–1.0)
Monocytes Relative: 5 %
Neutro Abs: 4.1 10*3/uL (ref 1.7–7.7)
Neutrophils Relative %: 75 %
Platelets: 223 10*3/uL (ref 150–400)
RBC: 3.74 MIL/uL — ABNORMAL LOW (ref 4.22–5.81)
RDW: 12.6 % (ref 11.5–15.5)
WBC: 5.4 10*3/uL (ref 4.0–10.5)
nRBC: 0 % (ref 0.0–0.2)

## 2019-01-25 LAB — D-DIMER, QUANTITATIVE: D-Dimer, Quant: 1.23 ug/mL-FEU — ABNORMAL HIGH (ref 0.00–0.50)

## 2019-01-25 LAB — HEMOGLOBIN A1C
Hgb A1c MFr Bld: 7.9 % — ABNORMAL HIGH (ref 4.8–5.6)
Mean Plasma Glucose: 180.03 mg/dL

## 2019-01-25 LAB — BRAIN NATRIURETIC PEPTIDE: B Natriuretic Peptide: 575.2 pg/mL — ABNORMAL HIGH (ref 0.0–100.0)

## 2019-01-25 LAB — CREATININE, URINE, RANDOM: Creatinine, Urine: 30.81 mg/dL

## 2019-01-25 LAB — C-REACTIVE PROTEIN: CRP: 11.2 mg/dL — ABNORMAL HIGH (ref <1.0)

## 2019-01-25 LAB — OSMOLALITY, URINE: Osmolality, Ur: 582 mOsm/kg (ref 300–900)

## 2019-01-25 LAB — SODIUM, URINE, RANDOM: Sodium, Ur: 41 mmol/L

## 2019-01-25 LAB — TYPE AND SCREEN
ABO/RH(D): A POS
Antibody Screen: NEGATIVE

## 2019-01-25 LAB — OSMOLALITY: Osmolality: 297 mOsm/kg — ABNORMAL HIGH (ref 275–295)

## 2019-01-25 LAB — ABO/RH: ABO/RH(D): A POS

## 2019-01-25 LAB — URIC ACID: Uric Acid, Serum: 6.1 mg/dL (ref 3.7–8.6)

## 2019-01-25 MED ORDER — PNEUMOCOCCAL VAC POLYVALENT 25 MCG/0.5ML IJ INJ
0.5000 mL | INJECTION | INTRAMUSCULAR | Status: DC | PRN
  Filled 2019-01-25: qty 0.5

## 2019-01-25 MED ORDER — INSULIN ASPART 100 UNIT/ML ~~LOC~~ SOLN
4.0000 [IU] | Freq: Three times a day (TID) | SUBCUTANEOUS | Status: DC
  Administered 2019-01-25 (×2): 4 [IU] via SUBCUTANEOUS

## 2019-01-25 MED ORDER — INSULIN ASPART 100 UNIT/ML ~~LOC~~ SOLN
25.0000 [IU] | Freq: Once | SUBCUTANEOUS | Status: AC
  Administered 2019-01-25: 18:00:00 25 [IU] via SUBCUTANEOUS

## 2019-01-25 MED ORDER — INSULIN GLARGINE 100 UNIT/ML ~~LOC~~ SOLN
30.0000 [IU] | Freq: Every day | SUBCUTANEOUS | Status: DC
  Administered 2019-01-25 – 2019-01-26 (×2): 30 [IU] via SUBCUTANEOUS
  Filled 2019-01-25 (×2): qty 0.3

## 2019-01-25 MED ORDER — INSULIN ASPART 100 UNIT/ML ~~LOC~~ SOLN
6.0000 [IU] | Freq: Three times a day (TID) | SUBCUTANEOUS | Status: DC
  Administered 2019-01-26 – 2019-01-27 (×5): 6 [IU] via SUBCUTANEOUS

## 2019-01-25 MED ORDER — ENOXAPARIN SODIUM 40 MG/0.4ML ~~LOC~~ SOLN
40.0000 mg | SUBCUTANEOUS | Status: DC
  Administered 2019-01-25 – 2019-01-28 (×4): 40 mg via SUBCUTANEOUS
  Filled 2019-01-25 (×4): qty 0.4

## 2019-01-25 MED ORDER — INSULIN GLARGINE 100 UNIT/ML ~~LOC~~ SOLN
15.0000 [IU] | Freq: Every day | SUBCUTANEOUS | Status: DC
  Administered 2019-01-25: 15 [IU] via SUBCUTANEOUS
  Filled 2019-01-25: qty 0.15

## 2019-01-25 MED ORDER — ATORVASTATIN CALCIUM 40 MG PO TABS
80.0000 mg | ORAL_TABLET | Freq: Every day | ORAL | Status: DC
  Administered 2019-01-25 – 2019-01-28 (×4): 80 mg via ORAL
  Filled 2019-01-25 (×4): qty 2

## 2019-01-25 MED ORDER — ATENOLOL 50 MG PO TABS
50.0000 mg | ORAL_TABLET | Freq: Every day | ORAL | Status: DC
  Administered 2019-01-25 – 2019-01-29 (×5): 50 mg via ORAL
  Filled 2019-01-25 (×6): qty 1

## 2019-01-25 MED ORDER — METHYLPREDNISOLONE SODIUM SUCC 40 MG IJ SOLR
40.0000 mg | Freq: Two times a day (BID) | INTRAMUSCULAR | Status: DC
  Administered 2019-01-25 – 2019-01-29 (×8): 40 mg via INTRAVENOUS
  Filled 2019-01-25 (×8): qty 1

## 2019-01-25 MED ORDER — INSULIN ASPART 100 UNIT/ML ~~LOC~~ SOLN
0.0000 [IU] | Freq: Three times a day (TID) | SUBCUTANEOUS | Status: DC
  Administered 2019-01-25: 12:00:00 26 [IU] via SUBCUTANEOUS
  Administered 2019-01-25: 15 [IU] via SUBCUTANEOUS
  Administered 2019-01-26: 09:00:00 3 [IU] via SUBCUTANEOUS
  Administered 2019-01-26: 15 [IU] via SUBCUTANEOUS

## 2019-01-25 MED ORDER — INSULIN ASPART 100 UNIT/ML ~~LOC~~ SOLN
0.0000 [IU] | Freq: Every day | SUBCUTANEOUS | Status: DC
  Administered 2019-01-25: 2 [IU] via SUBCUTANEOUS
  Administered 2019-01-26: 4 [IU] via SUBCUTANEOUS

## 2019-01-25 MED ORDER — LACTATED RINGERS IV SOLN
INTRAVENOUS | Status: AC
  Administered 2019-01-25 – 2019-01-26 (×2): via INTRAVENOUS

## 2019-01-25 NOTE — Progress Notes (Signed)
Inpatient Diabetes Program Recommendations  AACE/ADA: New Consensus Statement on Inpatient Glycemic Control (2015)  Target Ranges:  Prepandial:   less than 140 mg/dL      Peak postprandial:   less than 180 mg/dL (1-2 hours)      Critically ill patients:  140 - 180 mg/dL   Lab Results  Component Value Date   GLUCAP 348 (H) 01/25/2019   HGBA1C 7.9 (H) 01/25/2019    Review of Glycemic Control Results for CLETIS, CLACK (MRN [id number]) as of 01/25/2019 09:51  Ref. Range 01/25/2019 00:43 01/25/2019 08:01  Glucose-Capillary Latest Ref Range: 70 - 99 mg/dL 296 (H) 348 (H)   Diabetes history: DM 2 Outpatient Diabetes medications:  Metformin 500 mg bid, Glucotrol 10 mg daily Current orders for Inpatient glycemic control:  Solumedrol 40 mg bid Novolog sensitive tid with meals  Inpatient Diabetes Program Recommendations:    Note that blood sugars > 300 mg/dL.  Consider adding Basal and Bolus insulin to patient's current insulin regimen  Thanks  Adah Perl, RN, BC-ADM Inpatient Diabetes Coordinator Pager [phone] (8a-5p)

## 2019-01-25 NOTE — Progress Notes (Signed)
Patient continues to be on RA. Spouse, Peggy, updated via patient on personal cell phone upon admission. No questions at present time. Education provided to patient about incentive spirometor. Will continue to monitor and continue with POC.

## 2019-01-25 NOTE — Progress Notes (Signed)
PROGRESS NOTE                                                                                                                                                                                                             Patient Demographics:    Matthew Wolf, is a 73 y.o. male, DOB - 1945/12/22, HWT:[id number]  Outpatient Primary MD for the patient is [name], NP    LOS - 1  Admit date - 01/24/2019    CC - SOB     Brief Narrative - Matthew Wolf is a 73 y.o. male with medical history significant of ckd, dm, htn comes in with cough and sob covid pos on 10/9.  He currently denies sob, and is on RA w/ normal o2 sats.  Denies any n/v/d.  No chest pain or abd pain.  No le edema or swelling.  Pt referred for admission for pna on cxr caused by covid infection.   Subjective:    Matthew Wolf today has, No headache, No chest pain, No abdominal pain - No Nausea, No new weakness tingling or numbness, No Cough - improved SOB.    Assessment  & Plan :     1. Acute Hypoxic Resp. Failure due to Acute Covid 19 Viral Pneumonitis during the ongoing 2020 Covid 19 Pandemic - he had exertional SOB, started on IV Steroids and Remdesivir, clinically much better, continue supportive Rx advance activity. Monitor closely.   COVID-19 Labs  Recent Labs    01/24/19 1707 01/25/19 0530  DDIMER  --  1.23*  CRP 9.7* 11.2*    Lab Results  Component Value Date   SARSCOV2NAA Detected (A) 01/14/2019     SpO2: 90 %  Hepatic Function Latest Ref Rng & Units 01/25/2019 01/24/2019 10/27/2018  Total Protein 6.5 - 8.1 g/dL 6.3(L) 7.2 6.7  Albumin 3.5 - 5.0 g/dL 3.1(L) 3.5 4.6  AST 15 - 41 U/L 26 34 20  ALT 0 - 44 U/L _0 Alk Phosphatase 38 - 126 U/L 95 106 128(H)  Total Bilirubin 0.3 - 1.2 mg/dL 0.8 0.8 0.5        Component Value Date/Time   BNP 575.2 (H) 01/25/2019 0530      2.  Dehydration with Hyponatremia - IVF - Ur lytes,  Osm and Serum Uric acid.  3. HTN -  B Blocker  4. Dyslipidemia - Statin.  5. DM2 - Lantus + ISS + premeal.  Lab Results  Component Value Date   HGBA1C 7.9 (H) 01/25/2019    CBG (last 3)  Recent Labs    01/25/19 0043 01/25/19 0801  GLUCAP 296* 348*      Condition - Fair  Family Communication  :  Daughter 01/25/19 at 10.20 am  Code Status :  Full  Diet :   Diet Order            Diet Heart Room service appropriate? Yes; Fluid consistency: Thin  Diet effective now               Disposition Plan  :  Home  Consults  :  None  Procedures  :     PUD Prophylaxis :    DVT Prophylaxis  :  Lovenox    Lab Results  Component Value Date   PLT 223 01/25/2019    Inpatient Medications  Scheduled Meds: . aspirin EC  81 mg Oral Daily  . enoxaparin (LOVENOX) injection  30 mg Subcutaneous Q12H  . insulin aspart  0-15 Units Subcutaneous TID WC  . insulin aspart  0-5 Units Subcutaneous QHS  . insulin glargine  15 Units Subcutaneous Daily  . ipratropium  2 puff Inhalation Q6H  . methylPREDNISolone (SOLU-MEDROL) injection  40 mg Intravenous Q12H  . vitamin C  500 mg Oral Daily  . zinc sulfate  220 mg Oral Daily   Continuous Infusions: . remdesivir 100 mg in NS 250 mL     PRN Meds:.acetaminophen, chlorpheniramine-HYDROcodone, guaiFENesin-dextromethorphan, [DISCONTINUED] ondansetron **OR** ondansetron (ZOFRAN) IV, [START ON 01/31/2019] pneumococcal 23 valent vaccine  Antibiotics  :    Anti-infectives (From admission, onward)   Start     Dose/Rate Route Frequency Ordered Stop   01/25/19 1600  remdesivir 100 mg in sodium chloride 0.9 % 250 mL IVPB     100 mg 500 mL/hr over 30 Minutes Intravenous Every 24 hours 01/24/19 2214 01/29/19 1559   01/24/19 2245  remdesivir 200 mg in sodium chloride 0.9 % 250 mL IVPB     200 mg 500 mL/hr over 30 Minutes Intravenous Once 01/24/19 2214 01/25/19 0040       Time Spent in minutes  30   Lala Lund M.D on 01/25/2019 at  10:19 AM  To page go to [email or web address] - password [email or web address]  [facility] -  Office  [phone]     See all Orders from today for further details    Objective:   Vitals:   01/24/19 2234 01/24/19 2235 01/25/19 0432 01/25/19 0800  BP:  120/63 123/66 137/73  Pulse:  66 67 63  Resp:  (!) 27 (!) 22 16  Temp:  97.6 F (36.4 C) 98 F (36.7 C) (!) 97.4 F (36.3 C)  TempSrc:  Oral Oral Oral  SpO2:   93% 90%  Weight: 57.8 kg 57.8 kg    Height: _0  (1.626 m) _1  (1.702 m)      Wt Readings from Last 3 Encounters:  01/24/19 57.8 kg  01/24/19 57.8 kg  07/16/18 61.2 kg     Intake/Output Summary (Last 24 hours) at 01/25/2019 1019 Last data filed at 01/25/2019 0524 Gross per 24 hour  Intake 750 ml  Output 300 ml  Net 450 ml     Physical Exam  Awake Alert, Oriented X 3, No new F.N deficits, Normal affect [place].AT,PERRAL Supple Neck,No JVD, No cervical lymphadenopathy appriciated.  Symmetrical Chest  wall movement, Good air movement bilaterally, CTAB RRR,No Gallops,Rubs or new Murmurs, No Parasternal Heave +ve B.Sounds, Abd Soft, No tenderness, No organomegaly appriciated, No rebound - guarding or rigidity. No Cyanosis, Clubbing or edema, No new Rash or bruise       Data Review:    CBC Recent Labs  Lab 01/24/19 1503 01/25/19 0530  WBC 8.4 5.4  HGB 13.6 12.1*  HCT 39.7 35.5*  PLT 208 223  MCV 93.0 94.9  MCH 31.9 32.4  MCHC 34.3 34.1  RDW 12.7 12.6  LYMPHSABS  --  0.8  MONOABS  --  0.3  EOSABS  --  0.0  BASOSABS  --  0.0    Chemistries  Recent Labs  Lab 01/24/19 1557 01/25/19 0530  NA 132* 129*  K 5.0 4.7  CL 100 101  CO2 20* 17*  GLUCOSE 195* 381*  BUN 30* 34*  CREATININE 1.06 1.04  CALCIUM 8.5* 7.8*  AST 34 26  ALT 26 24  ALKPHOS 106 95  BILITOT 0.8 0.8   ------------------------------------------------------------------------------------------------------------------ No results for input(s): CHOL, HDL, LDLCALC, TRIG, CHOLHDL, LDLDIRECT  in the last 72 hours.  Lab Results  Component Value Date   HGBA1C 7.9 (H) 01/25/2019   ------------------------------------------------------------------------------------------------------------------ No results for input(s): TSH, T4TOTAL, T3FREE, THYROIDAB in the last 72 hours.  Invalid input(s): FREET3  Cardiac Enzymes No results for input(s): CKMB, TROPONINI, MYOGLOBIN in the last 168 hours.  Invalid input(s): CK ------------------------------------------------------------------------------------------------------------------    Component Value Date/Time   BNP 575.2 (H) 01/25/2019 0530    Micro Results No results found for this or any previous visit (from the past 240 hour(s)).  Radiology Reports Dg Chest Port 1 View  Result Date: 01/24/2019 CLINICAL DATA:  COVID-19 positive.  Weakness. EXAM: PORTABLE CHEST 1 VIEW COMPARISON:  08/16/2014 FINDINGS: Postop CABG.  Negative for heart failure. Subtle airspace disease is present in the right upper lobe and right lower lobe as well as the left lower lobe. No collapse. No significant pleural effusion. No acute skeletal abnormality. IMPRESSION: Subtle bilateral airspace disease may represent pneumonia. Recommend CT chest with contrast for further evaluation. Electronically Signed   By: Franchot Gallo M.D.   On: 01/24/2019 15:48

## 2019-01-25 NOTE — Progress Notes (Signed)
Assisted up to chair. Monitored O2 sats. RA 90-94%. During ambulation to chair O2 sats 89% but came back up w/ rest fairly well. Continuing to monitor.

## 2019-01-25 NOTE — Evaluation (Signed)
Physical Therapy Evaluation Patient Details Name: Matthew Wolf MRN: [id number] DOB: Jan 05, 1946 Today's Date: 01/25/2019   History of Present Illness  73 y/o male w/ hx of BPH, CKD III, DM, diabetic peripheral neuropathy, HTN, heart trouble, HLD, hypertensive kidney disease, MI. arrived to ED w/ cough and SOB, was COVID + 10/09  Clinical Impression   Pt admitted with above diagnosis. Pt currently with functional limitations due to the deficits listed below (see PT Problem List). Pt is at SBA- min guard with most functional mobility, he was able to ambulate approx 138ft with no AD and min guard, but noted to furniture and side rail cruise. Noted multi loses of balance and pt states this happens to his even at home. Pt is on room air and was able to maintain sats in upper 80s and 90s. Pt will benefit from skilled PT to increase his overall strength, balance and coordination, activity tolerance, independence and safety with mobility to allow discharge to the venue listed below.       Follow Up Recommendations No PT follow up(once d/c from hospital)    Equipment Recommendations  Other (comment)(TBD will trial on multi AD to find best choice for pt)    Recommendations for Other Services       Precautions / Restrictions Precautions Precautions: Fall Restrictions Weight Bearing Restrictions: No      Mobility  Bed Mobility Overal bed mobility: Modified Independent                Transfers Overall transfer level: Needs assistance Equipment used: None Transfers: Sit to/from Stand Sit to Stand: Supervision            Ambulation/Gait Ambulation/Gait assistance: Min assist Gait Distance (Feet): 120 Feet Assistive device: None(cruises on furniture and side rails in hallway) Gait Pattern/deviations: Step-through pattern     General Gait Details: multiple LOBs noted able to catch himself on furniture/walls, states this is his usual that when hes at work he hold on to boxes  etc as to not fall  Financial trader Rankin (Stroke Patients Only)       Balance Overall balance assessment: Needs assistance Sitting-balance support: Feet supported Sitting balance-Leahy Scale: Fair     Standing balance support: Single extremity supported;During functional activity Standing balance-Leahy Scale: Poor Standing balance comment: multi LOBs if not holding on to wall/furniture                             Pertinent Vitals/Pain Pain Assessment: No/denies pain    Home Living Family/patient expects to be discharged to:: Private residence Living Arrangements: Spouse/significant other Available Help at Discharge: Family Type of Home: House Home Access: Stairs to enter   Technical brewer of Steps: 1 Home Layout: One level Home Equipment: None      Prior Function Level of Independence: Independent               Hand Dominance        Extremity/Trunk Assessment   Upper Extremity Assessment Upper Extremity Assessment: Overall WFL for tasks assessed    Lower Extremity Assessment Lower Extremity Assessment: Overall WFL for tasks assessed    Cervical / Trunk Assessment Cervical / Trunk Assessment: Normal  Communication   Communication: No difficulties  Cognition Arousal/Alertness: Awake/alert Behavior During Therapy: WFL for tasks assessed/performed Overall Cognitive Status: Within Functional Limits for tasks assessed  General Comments General comments (skin integrity, edema, etc.): PTA pt states was very independent and was working, noted pt is quite unbalanced with ambulation, pt states this is his norm and that 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 Pt denies having any ADs at home.     Exercises Other Exercises Other Exercises: initiated use of incentive spirometer and educated pt on use and  importance of completing as prescribed   Assessment/Plan    PT Assessment Patient needs continued PT services(while in hospital)  PT Problem List Decreased strength;Decreased activity tolerance;Decreased balance;Decreased mobility;Decreased coordination;Decreased safety awareness       PT Treatment Interventions Gait training;Functional mobility training;Therapeutic activities;Therapeutic exercise;Balance training;Neuromuscular re-education;Patient/family education    PT Goals (Current goals can be found in the Care Plan section)  Acute Rehab PT Goals Patient Stated Goal: get over cough PT Goal Formulation: With patient Time For Goal Achievement: 02/08/19 Potential to Achieve Goals: Good    Frequency Min 3X/week   Barriers to discharge        Co-evaluation               AM-PAC PT "6 Clicks" Mobility  Outcome Measure Help needed turning from your back to your side while in a flat bed without using bedrails?: None Help needed moving from lying on your back to sitting on the side of a flat bed without using bedrails?: None Help needed moving to and from a bed to a chair (including a wheelchair)?: A Little Help needed standing up from a chair using your arms (e.g., wheelchair or bedside chair)?: None Help needed to walk in hospital room?: A Little Help needed climbing 3-5 steps with a railing? : A Lot 6 Click Score: 20    End of Session   Activity Tolerance: Patient limited by fatigue Patient left: in chair;with call bell/phone within reach   PT Visit Diagnosis: Unsteadiness on feet (R26.81);Muscle weakness (generalized) (M62.81)    Time: 9678-9381 PT Time Calculation (min) (ACUTE ONLY): 15 min   Charges:   PT Evaluation $PT Eval Moderate Complexity: 1 Mod          Drema Pry, PT    Freddi Starr 01/25/2019, 12:41 PM

## 2019-01-26 LAB — CBC WITH DIFFERENTIAL/PLATELET
Abs Immature Granulocytes: 0.55 10*3/uL — ABNORMAL HIGH (ref 0.00–0.07)
Basophils Absolute: 0 10*3/uL (ref 0.0–0.1)
Basophils Relative: 0 %
Eosinophils Absolute: 0 10*3/uL (ref 0.0–0.5)
Eosinophils Relative: 0 %
HCT: 35.4 % — ABNORMAL LOW (ref 39.0–52.0)
Hemoglobin: 12.2 g/dL — ABNORMAL LOW (ref 13.0–17.0)
Immature Granulocytes: 4 %
Lymphocytes Relative: 11 %
Lymphs Abs: 1.7 10*3/uL (ref 0.7–4.0)
MCH: 32.4 pg (ref 26.0–34.0)
MCHC: 34.5 g/dL (ref 30.0–36.0)
MCV: 94.1 fL (ref 80.0–100.0)
Monocytes Absolute: 1 10*3/uL (ref 0.1–1.0)
Monocytes Relative: 7 %
Neutro Abs: 12.4 10*3/uL — ABNORMAL HIGH (ref 1.7–7.7)
Neutrophils Relative %: 78 %
Platelets: 307 10*3/uL (ref 150–400)
RBC: 3.76 MIL/uL — ABNORMAL LOW (ref 4.22–5.81)
RDW: 12.8 % (ref 11.5–15.5)
WBC: 15.7 10*3/uL — ABNORMAL HIGH (ref 4.0–10.5)
nRBC: 0 % (ref 0.0–0.2)

## 2019-01-26 LAB — COMPREHENSIVE METABOLIC PANEL
ALT: 29 U/L (ref 0–44)
AST: 30 U/L (ref 15–41)
Albumin: 3 g/dL — ABNORMAL LOW (ref 3.5–5.0)
Alkaline Phosphatase: 91 U/L (ref 38–126)
Anion gap: 11 (ref 5–15)
BUN: 33 mg/dL — ABNORMAL HIGH (ref 8–23)
CO2: 20 mmol/L — ABNORMAL LOW (ref 22–32)
Calcium: 8.6 mg/dL — ABNORMAL LOW (ref 8.9–10.3)
Chloride: 101 mmol/L (ref 98–111)
Creatinine, Ser: 0.8 mg/dL (ref 0.61–1.24)
GFR calc Af Amer: >60 mL/min (ref >60)
GFR calc non Af Amer: >60 mL/min (ref >60)
Glucose, Bld: 169 mg/dL — ABNORMAL HIGH (ref 70–99)
Potassium: 4.3 mmol/L (ref 3.5–5.1)
Sodium: 132 mmol/L — ABNORMAL LOW (ref 135–145)
Total Bilirubin: 0.5 mg/dL (ref 0.3–1.2)
Total Protein: 6.2 g/dL — ABNORMAL LOW (ref 6.5–8.1)

## 2019-01-26 LAB — D-DIMER, QUANTITATIVE: D-Dimer, Quant: 0.66 ug/mL-FEU — ABNORMAL HIGH (ref 0.00–0.50)

## 2019-01-26 LAB — GLUCOSE, CAPILLARY
Glucose-Capillary: 185 mg/dL — ABNORMAL HIGH (ref 70–99)
Glucose-Capillary: 447 mg/dL — ABNORMAL HIGH (ref 70–99)
Glucose-Capillary: 338 mg/dL — ABNORMAL HIGH (ref 70–99)
Glucose-Capillary: 312 mg/dL — ABNORMAL HIGH (ref 70–99)

## 2019-01-26 LAB — C-REACTIVE PROTEIN: CRP: 7.4 mg/dL — ABNORMAL HIGH (ref <1.0)

## 2019-01-26 LAB — BRAIN NATRIURETIC PEPTIDE: B Natriuretic Peptide: 852.1 pg/mL — ABNORMAL HIGH (ref 0.0–100.0)

## 2019-01-26 MED ORDER — INSULIN ASPART 100 UNIT/ML ~~LOC~~ SOLN
0.0000 [IU] | Freq: Three times a day (TID) | SUBCUTANEOUS | Status: DC
  Administered 2019-01-26: 15 [IU] via SUBCUTANEOUS
  Administered 2019-01-27: 7 [IU] via SUBCUTANEOUS
  Administered 2019-01-27: 12:00:00 20 [IU] via SUBCUTANEOUS
  Administered 2019-01-27: 09:00:00 4 [IU] via SUBCUTANEOUS
  Administered 2019-01-28: 17:00:00 11 [IU] via SUBCUTANEOUS
  Administered 2019-01-28: 20 [IU] via SUBCUTANEOUS
  Administered 2019-01-29: 12:00:00 4 [IU] via SUBCUTANEOUS

## 2019-01-26 MED ORDER — INSULIN GLARGINE 100 UNIT/ML ~~LOC~~ SOLN
25.0000 [IU] | Freq: Two times a day (BID) | SUBCUTANEOUS | Status: DC
  Administered 2019-01-26 – 2019-01-27 (×2): 25 [IU] via SUBCUTANEOUS
  Filled 2019-01-26 (×3): qty 0.25

## 2019-01-26 NOTE — Progress Notes (Signed)
PROGRESS NOTE                                                                                                                                                                                                             Patient Demographics:    Matthew Wolf, is a 73 y.o. male, DOB - 1945/12/02, ZGY:[id number]  Outpatient Primary MD for the patient is [name], NP    LOS - 2  Admit date - 01/24/2019    CC - SOB     Brief Narrative - Matthew Wolf is a 74 y.o. male with medical history significant of ckd, dm, htn comes in with cough and sob covid pos on 10/9.  He currently denies sob, and is on RA w/ normal o2 sats.  Denies any n/v/d.  No chest pain or abd pain.  No le edema or swelling.  Pt referred for admission for pna on cxr caused by covid infection.   Subjective:    Matthew Wolf today has, No headache, No chest pain, No abdominal pain - No Nausea, No new weakness tingling or numbness, No Cough - improved SOB.    Assessment  & Plan :     Acute Hypoxic Resp. Failure due to Acute Covid 19 Viral Pneumonitis during the ongoing 2020 Covid 19 Pandemic  Exertional SOB with reported borderline hypoxia at admission. Maintains sats here on RA at 91-92% Continue IV Steroids and Remdesivir, clinically improving. Recent Labs    01/24/19 1707 01/25/19 0530 01/26/19 0600  DDIMER  --  1.23*  --   CRP 9.7* 11.2* 7.4*    Dehydration with Hyponatremia  Likely in the setting of poor PO intake Continue IVF as tolerated until PO intake improves  HTN, essential Continue home B Blocker  Dyslipidemia - Continue Statin.  DM2, uncontrolled - Hgb A1C 7.9 - Glucose remains poorly controlled (into 400s today) - Increase Lantus, increase to resistant ISS + continue 6u premeal.  CBG (last 3)  Recent Labs    01/25/19 1113 01/25/19 1534 01/25/19 2015  GLUCAP 445* 410* 235*      Condition - Fair  Family Communication   :  Daughter 01/25/19  Code Status :  Full  Diet :   Diet Order            Diet Heart Room service appropriate? Yes;  Fluid consistency: Thin  Diet effective now               Disposition Plan  :  Home, once medically improved and after completion of remdesivir  Consults  :  None  DVT Prophylaxis  :  Lovenox    Lab Results  Component Value Date   PLT 307 01/26/2019    Inpatient Medications  Scheduled Meds: . aspirin EC  81 mg Oral Daily  . atenolol  50 mg Oral Daily  . atorvastatin  80 mg Oral QHS  . enoxaparin (LOVENOX) injection  40 mg Subcutaneous Q24H  . insulin aspart  0-15 Units Subcutaneous TID WC  . insulin aspart  0-5 Units Subcutaneous QHS  . insulin aspart  6 Units Subcutaneous TID WC  . insulin glargine  30 Units Subcutaneous Daily  . ipratropium  2 puff Inhalation Q6H  . methylPREDNISolone (SOLU-MEDROL) injection  40 mg Intravenous Q12H  . vitamin C  500 mg Oral Daily  . zinc sulfate  220 mg Oral Daily   Continuous Infusions: . lactated ringers 75 mL/hr at 01/26/19 0546  . remdesivir 100 mg in NS 250 mL Stopped (01/25/19 1658)   PRN Meds:.acetaminophen, chlorpheniramine-HYDROcodone, guaiFENesin-dextromethorphan, [DISCONTINUED] ondansetron **OR** ondansetron (ZOFRAN) IV, [START ON 01/31/2019] pneumococcal 23 valent vaccine  Antibiotics  :    Anti-infectives (From admission, onward)   Start     Dose/Rate Route Frequency Ordered Stop   01/25/19 1600  remdesivir 100 mg in sodium chloride 0.9 % 250 mL IVPB     100 mg 500 mL/hr over 30 Minutes Intravenous Every 24 hours 01/24/19 2214 01/29/19 1559   01/24/19 2245  remdesivir 200 mg in sodium chloride 0.9 % 250 mL IVPB     200 mg 500 mL/hr over 30 Minutes Intravenous Once 01/24/19 2214 01/25/19 0040       Objective:   Vitals:   01/25/19 2015 01/25/19 2016 01/26/19 0535 01/26/19 0810  BP: (!) 119/57  116/65 122/62  Pulse: 72   68  Resp:  18 20 16   Temp:  97.6 F (36.4 C) 98.3 F (36.8 C) (!)  97.5 F (36.4 C)  TempSrc:  Oral Axillary Oral  SpO2: (!) 89% 90% 92% 91%  Weight:      Height:        Wt Readings from Last 3 Encounters:  01/24/19 57.8 kg  01/24/19 57.8 kg  07/16/18 61.2 kg     Intake/Output Summary (Last 24 hours) at 01/26/2019 0832 Last data filed at 01/26/2019 0547 Gross per 24 hour  Intake 1603.85 ml  Output -  Net 1603.85 ml     Physical Exam  Awake Alert, Oriented X 3, No new F.N deficits, Normal affect .AT,PERRAL Supple Neck,No JVD, No cervical lymphadenopathy appriciated.  Symmetrical Chest wall movement, Good air movement bilaterally, CTAB RRR,No Gallops,Rubs or new Murmurs, No Parasternal Heave +ve B.Sounds, Abd Soft, No tenderness, No organomegaly appriciated, No rebound - guarding or rigidity. No Cyanosis, Clubbing or edema, No new rashes or bruises       Data Review:    CBC Recent Labs  Lab 01/24/19 1503 01/25/19 0530 01/26/19 0600  WBC 8.4 5.4 15.7*  HGB 13.6 12.1* 12.2*  HCT 39.7 35.5* 35.4*  PLT 208 223 307  MCV 93.0 94.9 94.1  MCH 31.9 32.4 32.4  MCHC 34.3 34.1 34.5  RDW 12.7 12.6 12.8  LYMPHSABS  --  0.8 1.7  MONOABS  --  0.3 1.0  EOSABS  --  0.0 0.0  BASOSABS  --  0.0 0.0    Chemistries  Recent Labs  Lab 01/24/19 1557 01/25/19 0530 01/26/19 0600  NA 132* 129* 132*  K 5.0 4.7 4.3  CL 100 101 101  CO2 20* 17* 20*  GLUCOSE 195* 381* 169*  BUN 30* 34* 33*  CREATININE 1.06 1.04 0.80  CALCIUM 8.5* 7.8* 8.6*  AST 34 26 30  ALT 26 24 29   ALKPHOS 106 95 91  BILITOT 0.8 0.8 0.5   ------------------------------------------------------------------------------------------------------------------ No results for input(s): CHOL, HDL, LDLCALC, TRIG, CHOLHDL, LDLDIRECT in the last 72 hours.  Lab Results  Component Value Date   HGBA1C 7.9 (H) 01/25/2019  ------------------------------------------------------------------------------------------------------------------    Component Value Date/Time   BNP 852.1  (H) 01/26/2019 0600    Micro Results No results found for this or any previous visit (from the past 240 hour(s)).  Radiology Reports Dg Chest Port 1 View  Result Date: 01/24/2019 CLINICAL DATA:  COVID-19 positive.  Weakness. EXAM: PORTABLE CHEST 1 VIEW COMPARISON:  08/16/2014 FINDINGS: Postop CABG.  Negative for heart failure. Subtle airspace disease is present in the right upper lobe and right lower lobe as well as the left lower lobe. No collapse. No significant pleural effusion. No acute skeletal abnormality. IMPRESSION: Subtle bilateral airspace disease may represent pneumonia. Recommend CT chest with contrast for further evaluation. Electronically Signed   By: 10/16/2014 M.D.   On: 01/24/2019 15:48    Time Spent in minutes  30  01/26/2019 DO  To page go to [email or web address] - password [email or web address] -  Office  [phone] See all Orders from today for further details

## 2019-01-26 NOTE — Evaluation (Addendum)
Occupational Therapy Evaluation Patient Details Name: Matthew Wolf MRN: [id number] DOB: 31-Oct-1945 Today's Date: 01/26/2019    History of Present Illness 73 y/o male w/ hx of BPH, CKD III, DM, diabetic peripheral neuropathy, HTN, heart trouble, HLD, hypertensive kidney disease, MI. arrived to ED w/ cough and SOB, was COVID + 10/09   Clinical Impression   This 73 y/o male presents with the above. PTA pt reports independence with ADL, iADL and functional mobility, reports still working. Pt presenting with generalized weakness and impaired balance impacting his functional performance. Pt completing session on RA with lowest spO2 noted 88% (when able to obtain clear waveform). Pt requiring close minguard-intermittent minA for room level mobility without AD. Noted pt often seeking out single UE support and with x1 LOB requiring minA to correct. Pt aware of his unsteadiness and reports often furniture walks at home. Discussed potential use of AD (such as SPC) for increased stability with dynamic tasks and pt open to the idea, will benefit from practice/further reinforcement. He currently requires minguard assist for LB, toileting, and standing grooming ADL. Pt will benefit from continued acute OT services to maximize his safety and independence with ADL and mobility. Will follow.     Follow Up Recommendations  No OT follow up;Supervision/Assistance - 24 hour    Equipment Recommendations  Tub/shower seat           Precautions / Restrictions Precautions Precautions: Fall Restrictions Weight Bearing Restrictions: No      Mobility Bed Mobility Overal bed mobility: Modified Independent                Transfers Overall transfer level: Needs assistance Equipment used: None Transfers: Sit to/from Stand Sit to Stand: Min guard         General transfer comment: for balance and safety as pt often reports LOB with initial standing    Balance Overall balance assessment: Needs  assistance Sitting-balance support: Feet supported Sitting balance-Leahy Scale: Fair     Standing balance support: Single extremity supported;During functional activity Standing balance-Leahy Scale: Fair Standing balance comment: for static standing, often seeking out UE suport with mobility                           ADL either performed or assessed with clinical judgement   ADL Overall ADL's : Needs assistance/impaired Eating/Feeding: Modified independent;Sitting   Grooming: Standing;Min guard   Upper Body Bathing: Set up;Min guard;Sitting   Lower Body Bathing: Minimal assistance;Sit to/from stand   Upper Body Dressing : Set up;Min guard;Sitting   Lower Body Dressing: Minimal assistance;Sit to/from stand   Toilet Transfer: Min guard;Minimal assistance;Ambulation   Toileting- Architect and Hygiene: Min guard;Supervision/safety;Sit to/from stand Toileting - Clothing Manipulation Details (indicate cue type and reason): pt standing to void bladder in bathroom, comleting clothing management (gown and underwear) without assist     Functional mobility during ADLs: Min guard;Minimal assistance General ADL Comments: pt with unsteadiness during mobility, often reaching out for items in room for added support and with x1 LOB requiring minA to recover. pt aware of this and disclosing to therapist prior to mobilizing that this is what he typically does (is his baseline) discussed option of using DME such as SPC and pt open to idea, feel he will benefit to reduce risk for falls     Vision         Perception     Praxis      Pertinent Vitals/Pain  Pain Assessment: No/denies pain     Hand Dominance     Extremity/Trunk Assessment Upper Extremity Assessment Upper Extremity Assessment: Generalized weakness   Lower Extremity Assessment Lower Extremity Assessment: Defer to PT evaluation   Cervical / Trunk Assessment Cervical / Trunk Assessment: Normal    Communication Communication Communication: No difficulties   Cognition Arousal/Alertness: Awake/alert Behavior During Therapy: WFL for tasks assessed/performed Overall Cognitive Status: Within Functional Limits for tasks assessed                                     General Comments       Exercises Exercises: Other exercises Other Exercises Other Exercises: use of IS x3   Shoulder Instructions      Home Living Family/patient expects to be discharged to:: Private residence Living Arrangements: Spouse/significant other Available Help at Discharge: Family Type of Home: House Home Access: Stairs to enter Technical brewer of Steps: 1   Home Layout: One level     Bathroom Shower/Tub: Teacher, early years/pre: Standard     Home Equipment: None          Prior Functioning/Environment Level of Independence: Independent        Comments: working        OT Problem List: Decreased strength;Decreased range of motion;Decreased activity tolerance;Impaired balance (sitting and/or standing);Decreased knowledge of use of DME or AE;Decreased knowledge of precautions;Cardiopulmonary status limiting activity      OT Treatment/Interventions: Self-care/ADL training;Therapeutic exercise;Energy conservation;DME and/or AE instruction;Therapeutic activities;Patient/family education;Balance training    OT Goals(Current goals can be found in the care plan section) Acute Rehab OT Goals Patient Stated Goal: home when able/ready OT Goal Formulation: With patient Time For Goal Achievement: 02/09/19 Potential to Achieve Goals: Good  OT Frequency: Min 3X/week   Barriers to D/C:            Co-evaluation              AM-PAC OT "6 Clicks" Daily Activity     Outcome Measure Help from another person eating meals?: None Help from another person taking care of personal grooming?: None Help from another person toileting, which includes using toliet,  bedpan, or urinal?: A Little Help from another person bathing (including washing, rinsing, drying)?: A Little Help from another person to put on and taking off regular upper body clothing?: None Help from another person to put on and taking off regular lower body clothing?: A Little 6 Click Score: 21   End of Session Nurse Communication: Mobility status  Activity Tolerance: Patient tolerated treatment well Patient left: in chair;with call bell/phone within reach  OT Visit Diagnosis: Unsteadiness on feet (R26.81);Muscle weakness (generalized) (M62.81)                Time: 1610-9604 OT Time Calculation (min): 29 min Charges:  OT General Charges $OT Visit: 1 Visit OT Evaluation $OT Eval Moderate Complexity: 1 Mod OT Treatments $Self Care/Home Management : 8-22 mins  Lou Cal, OT [facility] Services Pager [phone] Office [phone]    Raymondo Band 01/26/2019, 10:31 AM

## 2019-01-26 NOTE — Progress Notes (Signed)
Notified Dr Avon Gully that pts blood sugar was 447 on check.  15 units of sliding scale and 6 units of meal coverage given.  MD did not want BMP at this time.  No new orders at this time.

## 2019-01-26 NOTE — Progress Notes (Signed)
Asked patient if he wanted me to call and update his wife on his condition.  He stated no.  He ad already spoken with his wife and son and updated them.  Earleen Reaper RN

## 2019-01-27 LAB — COMPREHENSIVE METABOLIC PANEL
ALT: 27 U/L (ref 0–44)
AST: 21 U/L (ref 15–41)
Albumin: 2.9 g/dL — ABNORMAL LOW (ref 3.5–5.0)
Alkaline Phosphatase: 95 U/L (ref 38–126)
Anion gap: 11 (ref 5–15)
BUN: 32 mg/dL — ABNORMAL HIGH (ref 8–23)
CO2: 22 mmol/L (ref 22–32)
Calcium: 9 mg/dL (ref 8.9–10.3)
Chloride: 102 mmol/L (ref 98–111)
Creatinine, Ser: 0.88 mg/dL (ref 0.61–1.24)
GFR calc Af Amer: >60 mL/min (ref >60)
GFR calc non Af Amer: >60 mL/min (ref >60)
Glucose, Bld: 157 mg/dL — ABNORMAL HIGH (ref 70–99)
Potassium: 4.9 mmol/L (ref 3.5–5.1)
Sodium: 135 mmol/L (ref 135–145)
Total Bilirubin: 0.6 mg/dL (ref 0.3–1.2)
Total Protein: 6.1 g/dL — ABNORMAL LOW (ref 6.5–8.1)

## 2019-01-27 LAB — CBC WITH DIFFERENTIAL/PLATELET
Abs Immature Granulocytes: 0.35 10*3/uL — ABNORMAL HIGH (ref 0.00–0.07)
Basophils Absolute: 0.1 10*3/uL (ref 0.0–0.1)
Basophils Relative: 0 %
Eosinophils Absolute: 0 10*3/uL (ref 0.0–0.5)
Eosinophils Relative: 0 %
HCT: 36 % — ABNORMAL LOW (ref 39.0–52.0)
Hemoglobin: 12.2 g/dL — ABNORMAL LOW (ref 13.0–17.0)
Immature Granulocytes: 2 %
Lymphocytes Relative: 7 %
Lymphs Abs: 1.3 10*3/uL (ref 0.7–4.0)
MCH: 32 pg (ref 26.0–34.0)
MCHC: 33.9 g/dL (ref 30.0–36.0)
MCV: 94.5 fL (ref 80.0–100.0)
Monocytes Absolute: 0.9 10*3/uL (ref 0.1–1.0)
Monocytes Relative: 5 %
Neutro Abs: 15.1 10*3/uL — ABNORMAL HIGH (ref 1.7–7.7)
Neutrophils Relative %: 86 %
Platelets: 347 10*3/uL (ref 150–400)
RBC: 3.81 MIL/uL — ABNORMAL LOW (ref 4.22–5.81)
RDW: 13.1 % (ref 11.5–15.5)
WBC: 17.7 10*3/uL — ABNORMAL HIGH (ref 4.0–10.5)
nRBC: 0 % (ref 0.0–0.2)

## 2019-01-27 LAB — D-DIMER, QUANTITATIVE: D-Dimer, Quant: 0.76 ug/mL-FEU — ABNORMAL HIGH (ref 0.00–0.50)

## 2019-01-27 LAB — GLUCOSE, CAPILLARY
Glucose-Capillary: 179 mg/dL — ABNORMAL HIGH (ref 70–99)
Glucose-Capillary: 509 mg/dL (ref 70–99)
Glucose-Capillary: 413 mg/dL — ABNORMAL HIGH (ref 70–99)
Glucose-Capillary: 249 mg/dL — ABNORMAL HIGH (ref 70–99)

## 2019-01-27 LAB — C-REACTIVE PROTEIN: CRP: 3.6 mg/dL — ABNORMAL HIGH (ref <1.0)

## 2019-01-27 LAB — BRAIN NATRIURETIC PEPTIDE: B Natriuretic Peptide: 940.6 pg/mL — ABNORMAL HIGH (ref 0.0–100.0)

## 2019-01-27 MED ORDER — INSULIN ASPART 100 UNIT/ML ~~LOC~~ SOLN
10.0000 [IU] | Freq: Three times a day (TID) | SUBCUTANEOUS | Status: DC
  Administered 2019-01-27 – 2019-01-29 (×5): 10 [IU] via SUBCUTANEOUS

## 2019-01-27 MED ORDER — INSULIN GLARGINE 100 UNIT/ML ~~LOC~~ SOLN
30.0000 [IU] | Freq: Two times a day (BID) | SUBCUTANEOUS | Status: DC
  Administered 2019-01-27 – 2019-01-29 (×4): 30 [IU] via SUBCUTANEOUS
  Filled 2019-01-27 (×5): qty 0.3

## 2019-01-27 NOTE — Progress Notes (Signed)
Physical Therapy Treatment Patient Details Name: BLADEN UMAR MRN: [id number] DOB: [birth year] Today's Date: 01/27/2019    History of Present Illness 73 y/o male w/ hx of BPH, CKD III, DM, diabetic peripheral neuropathy, HTN, heart trouble, HLD, hypertensive kidney disease, MI. arrived to ED w/ cough and SOB, was COVID + 10/09    PT Comments    Progressing with tx, was on room air at therapist arrival states has been on room air for some time today. Attempted ambulation on room air and was able to complete approx 254ft with no AD, some LOBs noted, needed to hold on to side rail at times. But, insists this is his norm. Once returned to room noted desat, attempted to change probes and location still remained in low 80s, attempted to give 1L/min and briefly was able to sat in 90s but desat to low 80s again. Needed 2L/min to maintain sats in 90s.     Follow Up Recommendations  No PT follow up     Equipment Recommendations  None recommended by PT    Recommendations for Other Services       Precautions / Restrictions Precautions Precautions: Fall Restrictions Weight Bearing Restrictions: No    Mobility  Bed Mobility Overal bed mobility: Modified Independent                Transfers Overall transfer level: Modified independent Equipment used: None Transfers: Sit to/from Stand Sit to Stand: Modified independent (Device/Increase time)         General transfer comment: still showing some balance deficits but he insists these are his norm, he has been going to rest room and back on his own  Ambulation/Gait Ambulation/Gait assistance: Supervision Gait Distance (Feet): 200 Feet Assistive device: None Gait Pattern/deviations: Step-through pattern     General Gait Details: still having some LOBs, at times holds on to side rail in hall for balance, while ambulating does not desat but once in room noted desat to 83%, initially on adult finger probe, changed to pedi finger  probe with same results and return to new adult finger probe again with same results. Initially able to recover to 90s but returns to low 80s and unable to maintain sats >88%, placed on 1L/min via [place] and with same results able to get up to 90s but not consistent and returns to low 80s. placed pt on 2L/min via [place] and was finally able to reach sats >90 and stay in this range.   Stairs             Wheelchair Mobility    Modified Rankin (Stroke Patients Only)       Balance Overall balance assessment: Needs assistance Sitting-balance support: Feet supported Sitting balance-Leahy Scale: Good     Standing balance support: Single extremity supported;During functional activity Standing balance-Leahy Scale: Fair                              Cognition Arousal/Alertness: Awake/alert Behavior During Therapy: WFL for tasks assessed/performed Overall Cognitive Status: Within Functional Limits for tasks assessed                                        Exercises      General Comments General comments (skin integrity, edema, etc.): Pt was on room air at therapist arrival states has been on room air for  some time now. Attempted ambulation on room air and did well until returned to room and sat down, then desat to 83% with cues and instructions able to regain oxygenation to 90% but desats again and is unable to maintain sats in >88% range, initially placed pt on 1L/min and was again able to regain sats to 90s but again desat to low 80s once more and had diffiuclty with maintaining sats in 90s. once placed on 2L/min via [place] measured on adult finger probe was able to sat in 90s and remain in 90s.      Pertinent Vitals/Pain Pain Assessment: No/denies pain    Home Living                      Prior Function            PT Goals (current goals can now be found in the care plan section) Acute Rehab PT Goals Time For Goal Achievement: 02/08/19 Potential to  Achieve Goals: Good Progress towards PT goals: Progressing toward goals    Frequency    Min 3X/week      PT Plan Current plan remains appropriate    Co-evaluation              AM-PAC PT "6 Clicks" Mobility   Outcome Measure  Help needed turning from your back to your side while in a flat bed without using bedrails?: None Help needed moving from lying on your back to sitting on the side of a flat bed without using bedrails?: None Help needed moving to and from a bed to a chair (including a wheelchair)?: A Little Help needed standing up from a chair using your arms (e.g., wheelchair or bedside chair)?: None Help needed to walk in hospital room?: A Little Help needed climbing 3-5 steps with a railing? : A Lot 6 Click Score: 20    End of Session   Activity Tolerance: Treatment limited secondary to agitation Patient left: in bed;with call bell/phone within reach   PT Visit Diagnosis: Unsteadiness on feet (R26.81);Muscle weakness (generalized) (M62.81)     Time: 0254-2706 PT Time Calculation (min) (ACUTE ONLY): 25 min  Charges:  $Gait Training: 8-22 mins $Self Care/Home Management: [place], PT     Delford Field 01/27/2019, 3:58 PM

## 2019-01-27 NOTE — Progress Notes (Signed)
Notified MD of BS [id number].

## 2019-01-27 NOTE — Progress Notes (Signed)
PROGRESS NOTE                                                                                                                                                                                                             Patient Demographics:    Matthew Wolf, is a 73 y.o. male, DOB - 12-13-1945, ZOX:[id number]  Outpatient Primary MD for the patient is Marjie Skiffannady, Jolene T, NP    LOS - 3  Admit date - 01/24/2019    CC - SOB     Brief Narrative - Matthew Wolf is a 73 y.o. male with medical history significant of ckd, dm, htn comes in with cough and sob covid pos on 10/9.  He currently denies sob, and is on RA w/ normal o2 sats.  Denies any n/v/d.  No chest pain or abd pain.  No le edema or swelling.  Pt referred for admission for pna on cxr caused by covid infection.   Subjective:    Matthew Wolf today has, No headache, No chest pain, No abdominal pain - No Nausea, No new weakness tingling or numbness, No Cough - improved SOB.    Assessment  & Plan :   Acute Hypoxic Resp. Failure due to Acute Covid 19 Viral Pneumonitis during the ongoing 2020 Covid 19 Pandemic, ongoing  Exertional SOB with reported borderline hypoxia at admission.  SpO2: 92 % O2 Flow Rate (L/min): 2 L/min Continue IV Steroids(stop 10/28) and Remdesivir (stop 10/23) Recent Labs    01/25/19 0530 01/26/19 0600 01/27/19 0530  DDIMER 1.23* 0.66* 0.76*  CRP 11.2* 7.4* 3.6*    Dehydration with Hyponatremia, resolved  Likely in the setting of poor PO intake Continue IVF as tolerated until PO intake improves  HTN, essential Continue home B Blocker  Dyslipidemia - Continue Statin.  DM2, uncontrolled - Hgb A1C 7.9 - Glucose somewhat improving but still markedly uncontrolled mid-day - Increase Lantus 30BID, increase to resistant ISS + increase premeal to 10u  CBG (last 3)  Recent Labs    01/27/19 0800 01/27/19 1146 01/27/19 1300  GLUCAP 179* 509*  413*      Condition - Fair Family Communication  :  Daughter 01/25/19 Code Status :  Full Disposition Plan  :  Home, once medically improved and after completion of remdesivir DVT Prophylaxis  :  Lovenox  Lab Results  Component Value Date   PLT 347 01/27/2019    Inpatient Medications  Scheduled Meds: . aspirin EC  81 mg Oral Daily  . atenolol  50 mg Oral Daily  . atorvastatin  80 mg Oral QHS  . enoxaparin (LOVENOX) injection  40 mg Subcutaneous Q24H  . insulin aspart  0-20 Units Subcutaneous TID WC  . insulin aspart  0-5 Units Subcutaneous QHS  . insulin aspart  10 Units Subcutaneous TID WC  . insulin glargine  30 Units Subcutaneous BID  . ipratropium  2 puff Inhalation Q6H  . methylPREDNISolone (SOLU-MEDROL) injection  40 mg Intravenous Q12H  . vitamin C  500 mg Oral Daily  . zinc sulfate  220 mg Oral Daily   Continuous Infusions: . remdesivir 100 mg in NS 250 mL Stopped (01/26/19 1748)   PRN Meds:.acetaminophen, chlorpheniramine-HYDROcodone, guaiFENesin-dextromethorphan, [DISCONTINUED] ondansetron **OR** ondansetron (ZOFRAN) IV, [START ON 01/31/2019] pneumococcal 23 valent vaccine  Antibiotics  :    Anti-infectives (From admission, onward)   Start     Dose/Rate Route Frequency Ordered Stop   01/25/19 1600  remdesivir 100 mg in sodium chloride 0.9 % 250 mL IVPB     100 mg 500 mL/hr over 30 Minutes Intravenous Every 24 hours 01/24/19 2214 01/29/19 1559   01/24/19 2245  remdesivir 200 mg in sodium chloride 0.9 % 250 mL IVPB     200 mg 500 mL/hr over 30 Minutes Intravenous Once 01/24/19 2214 01/25/19 0040       Objective:   Vitals:   01/27/19 0532 01/27/19 0539 01/27/19 0756 01/27/19 0927  BP: 108/61  111/68 111/68  Pulse: 60 61 (!) 56 62  Resp: 16  18   Temp: 97.8 F (36.6 C)  97.6 F (36.4 C)   TempSrc: Oral  Oral   SpO2: (!) 89% 90% 93% 92%  Weight:      Height:        Wt Readings from Last 3 Encounters:  01/24/19 57.8 kg  01/24/19 57.8 kg   07/16/18 61.2 kg     Intake/Output Summary (Last 24 hours) at 01/27/2019 1518 Last data filed at 01/27/2019 0800 Gross per 24 hour  Intake 1210 ml  Output 0 ml  Net 1210 ml     Physical Exam  Awake Alert, Oriented X 3, No new F.N deficits, Normal affect [place].AT,PERRAL Supple Neck,No JVD, No cervical lymphadenopathy appriciated.  Symmetrical Chest wall movement, Good air movement bilaterally, CTAB RRR,No Gallops,Rubs or new Murmurs, No Parasternal Heave +ve B.Sounds, Abd Soft, No tenderness, No organomegaly appriciated, No rebound - guarding or rigidity. No Cyanosis, Clubbing or edema, No new rashes or bruises       Data Review:    CBC Recent Labs  Lab 01/24/19 1503 01/25/19 0530 01/26/19 0600 01/27/19 0530  WBC 8.4 5.4 15.7* 17.7*  HGB 13.6 12.1* 12.2* 12.2*  HCT 39.7 35.5* 35.4* 36.0*  PLT 208 223 307 347  MCV 93.0 94.9 94.1 94.5  MCH 31.9 32.4 32.4 32.0  MCHC 34.3 34.1 34.5 33.9  RDW 12.7 12.6 12.8 13.1  LYMPHSABS  --  0.8 1.7 1.3  MONOABS  --  0.3 1.0 0.9  EOSABS  --  0.0 0.0 0.0  BASOSABS  --  0.0 0.0 0.1    Chemistries  Recent Labs  Lab 01/24/19 1557 01/25/19 0530 01/26/19 0600 01/27/19 0530  NA 132* 129* 132* 135  K 5.0 4.7 4.3 4.9  CL 100 101 101 102  CO2 20* 17* 20* 22  GLUCOSE  195* 381* 169* 157*  BUN 30* 34* 33* 32*  CREATININE 1.06 1.04 0.80 0.88  CALCIUM 8.5* 7.8* 8.6* 9.0  AST 34 26 30 21   ALT 26 24 29 27   ALKPHOS 106 95 91 95  BILITOT 0.8 0.8 0.5 0.6   ------------------------------------------------------------------------------------------------------------------ No results for input(s): CHOL, HDL, LDLCALC, TRIG, CHOLHDL, LDLDIRECT in the last 72 hours.  Lab Results  Component Value Date   HGBA1C 7.9 (H) 01/25/2019  ------------------------------------------------------------------------------------------------------------------    Component Value Date/Time   BNP 940.6 (H) 01/27/2019 0530    Micro Results No results found  for this or any previous visit (from the past 240 hour(s)).  Radiology Reports Dg Chest Port 1 View  Result Date: 01/24/2019 CLINICAL DATA:  COVID-19 positive.  Weakness. EXAM: PORTABLE CHEST 1 VIEW COMPARISON:  08/16/2014 FINDINGS: Postop CABG.  Negative for heart failure. Subtle airspace disease is present in the right upper lobe and right lower lobe as well as the left lower lobe. No collapse. No significant pleural effusion. No acute skeletal abnormality. IMPRESSION: Subtle bilateral airspace disease may represent pneumonia. Recommend CT chest with contrast for further evaluation. Electronically Signed   By: 01/26/2019 M.D.   On: 01/24/2019 15:48    Time Spent in minutes  30  Marlan Palau DO  To page go to [email or web address] - password [email or web address] [facility] -  Office  [phone] See all Orders from today for further details

## 2019-01-28 LAB — CBC WITH DIFFERENTIAL/PLATELET
Abs Immature Granulocytes: 0.36 10*3/uL — ABNORMAL HIGH (ref 0.00–0.07)
Basophils Absolute: 0.1 10*3/uL (ref 0.0–0.1)
Basophils Relative: 0 %
Eosinophils Absolute: 0 10*3/uL (ref 0.0–0.5)
Eosinophils Relative: 0 %
HCT: 38.8 % — ABNORMAL LOW (ref 39.0–52.0)
Hemoglobin: 13.4 g/dL (ref 13.0–17.0)
Immature Granulocytes: 2 %
Lymphocytes Relative: 7 %
Lymphs Abs: 1.2 10*3/uL (ref 0.7–4.0)
MCH: 32.4 pg (ref 26.0–34.0)
MCHC: 34.5 g/dL (ref 30.0–36.0)
MCV: 93.7 fL (ref 80.0–100.0)
Monocytes Absolute: 0.8 10*3/uL (ref 0.1–1.0)
Monocytes Relative: 5 %
Neutro Abs: 15.2 10*3/uL — ABNORMAL HIGH (ref 1.7–7.7)
Neutrophils Relative %: 86 %
Platelets: 395 10*3/uL (ref 150–400)
RBC: 4.14 MIL/uL — ABNORMAL LOW (ref 4.22–5.81)
RDW: 12.7 % (ref 11.5–15.5)
WBC: 17.6 10*3/uL — ABNORMAL HIGH (ref 4.0–10.5)
nRBC: 0 % (ref 0.0–0.2)

## 2019-01-28 LAB — GLUCOSE, CAPILLARY
Glucose-Capillary: 112 mg/dL — ABNORMAL HIGH (ref 70–99)
Glucose-Capillary: 68 mg/dL — ABNORMAL LOW (ref 70–99)
Glucose-Capillary: 132 mg/dL — ABNORMAL HIGH (ref 70–99)
Glucose-Capillary: 449 mg/dL — ABNORMAL HIGH (ref 70–99)
Glucose-Capillary: 288 mg/dL — ABNORMAL HIGH (ref 70–99)
Glucose-Capillary: 88 mg/dL (ref 70–99)

## 2019-01-28 LAB — COMPREHENSIVE METABOLIC PANEL
ALT: 29 U/L (ref 0–44)
AST: 21 U/L (ref 15–41)
Albumin: 3.1 g/dL — ABNORMAL LOW (ref 3.5–5.0)
Alkaline Phosphatase: 105 U/L (ref 38–126)
Anion gap: 11 (ref 5–15)
BUN: 30 mg/dL — ABNORMAL HIGH (ref 8–23)
CO2: 24 mmol/L (ref 22–32)
Calcium: 9.5 mg/dL (ref 8.9–10.3)
Chloride: 102 mmol/L (ref 98–111)
Creatinine, Ser: 0.82 mg/dL (ref 0.61–1.24)
GFR calc Af Amer: >60 mL/min (ref >60)
GFR calc non Af Amer: >60 mL/min (ref >60)
Glucose, Bld: 64 mg/dL — ABNORMAL LOW (ref 70–99)
Potassium: 4.9 mmol/L (ref 3.5–5.1)
Sodium: 137 mmol/L (ref 135–145)
Total Bilirubin: 0.8 mg/dL (ref 0.3–1.2)
Total Protein: 6.5 g/dL (ref 6.5–8.1)

## 2019-01-28 LAB — GLUCOSE, RANDOM: Glucose, Bld: 419 mg/dL — ABNORMAL HIGH (ref 70–99)

## 2019-01-28 LAB — C-REACTIVE PROTEIN: CRP: 2.5 mg/dL — ABNORMAL HIGH (ref <1.0)

## 2019-01-28 LAB — D-DIMER, QUANTITATIVE: D-Dimer, Quant: 0.79 ug/mL-FEU — ABNORMAL HIGH (ref 0.00–0.50)

## 2019-01-28 NOTE — Progress Notes (Addendum)
Inpatient Diabetes Program Recommendations  AACE/ADA: New Consensus Statement on Inpatient Glycemic Control (2015)  Target Ranges:  Prepandial:   less than 140 mg/dL      Peak postprandial:   less than 180 mg/dL (1-2 hours)      Critically ill patients:  140 - 180 mg/dL   Lab Results  Component Value Date   GLUCAP 68 (L) 01/28/2019   HGBA1C 7.9 (H) 01/25/2019    Review of Glycemic Control Results for CRIMSON, BEER (MRN [id number]) as of 01/28/2019 09:27  Ref. Range 01/27/2019 13:00 01/27/2019 16:17 01/27/2019 21:00 01/28/2019 08:13  Glucose-Capillary Latest Ref Range: 70 - 99 mg/dL 413 (H) 249 (H) 112 (H) 68 (L)    Diabetes history: DM 2 Outpatient Diabetes medications:  Metformin 500 mg bid, Glucotrol 10 mg daily Current orders for Inpatient glycemic control: Novolog 0-20 units TID, Novolog 0-5 units QHS, Novolog 10 units TID, Lantus 30 units BID Solumedrol 40 mg bid   Inpatient Diabetes Program Recommendations:    Noted hypoglycemia this AM of 68 mg/dL along with interventions.  Consider slight decrease of Lantus to 28 units BID.  Addendum@ 1320: Patient CBG at lunch >400's mg/dL. Patient did not receive meal coverage with breakfast due to hypoglycemia this am. Secure chat sent to [facility] regarding recommendations.  Thanks, Bronson Curb, MSN, RNC-OB Diabetes Coordinator [phone] (8a-5p)

## 2019-01-28 NOTE — Progress Notes (Signed)
SATURATION QUALIFICATIONS: (This note is used to comply with regulatory documentation for home oxygen)  Patient Saturations on Room Air at Rest = 90%  Patient Saturations on Room Air while Ambulating = 79%  Patient Saturations on 2 Liters of oxygen while Ambulating = 94%  Please briefly explain why patient needs home oxygen: Patient needs O2 with ambulation to maintain saturations over 90%.

## 2019-01-28 NOTE — Progress Notes (Signed)
PROGRESS NOTE                                                                                                                                                                                                             Patient Demographics:    Matthew Wolf, is a 73 y.o. male, DOB - 17-Jul-1945, DXI:[id number]  Outpatient Primary MD for the patient is Aura Dials T, NP    LOS - 4  Admit date - 01/24/2019    CC - SOB     Brief Narrative - Matthew Wolf is a 73 y.o. male with medical history significant of ckd, dm, htn comes in with cough and sob covid pos on 10/9.  He currently denies sob, and is on RA w/ normal o2 sats.  Denies any n/v/d.  No chest pain or abd pain.  No le edema or swelling.  Pt referred for admission for pna on cxr caused by covid infection.   Subjective:    [name] today has, No headache, No chest pain, No abdominal pain - No Nausea, No new weakness tingling or numbness, No Cough - improved SOB.    Assessment  & Plan :    Acute Hypoxic Resp. Failure due to Acute Covid 19 Viral Pneumonitis during the ongoing 2020 Covid 19 Pandemic, ongoing  Patient continues to desat with exertion into the 70s on room air - will continue to wean oxygen as tolerated now that patient has completed remdesivir for possible discharge home once improved likely in the next 48-72h  SpO2: 90 % O2 Flow Rate (L/min): 3 L/min Continue IV Steroids(stop 10/28) and Remdesivir (stop 10/23) Recent Labs    01/26/19 0600 01/27/19 0530 01/28/19 0131  DDIMER 0.66* 0.76* 0.79*  CRP 7.4* 3.6* 2.5*    Dehydration with Hyponatremia, resolved  Likely in the setting of poor PO intake Continue IVF as tolerated until PO intake improves  HTN, essential Continue home B Blocker  Dyslipidemia - Continue Statin.  DM2, uncontrolled - Hgb A1C 7.9 - Glucose somewhat improving but still markedly uncontrolled mid-day - Increase Lantus  30BID, increase to resistant ISS + increase premeal to 10u  CBG (last 3)  Recent Labs    01/28/19 0813 01/28/19 0910 01/28/19 1150  GLUCAP 68* 132* 449*     Condition - Fair Family Communication:  Daughter updated Code  Status :  Full Disposition Plan  :  Home, once medically improved and after completion of remdesivir DVT Prophylaxis  :  Lovenox    Lab Results  Component Value Date   PLT 395 01/28/2019    Inpatient Medications  Scheduled Meds: . aspirin EC  81 mg Oral Daily  . atenolol  50 mg Oral Daily  . atorvastatin  80 mg Oral QHS  . enoxaparin (LOVENOX) injection  40 mg Subcutaneous Q24H  . insulin aspart  0-20 Units Subcutaneous TID WC  . insulin aspart  0-5 Units Subcutaneous QHS  . insulin aspart  10 Units Subcutaneous TID WC  . insulin glargine  30 Units Subcutaneous BID  . ipratropium  2 puff Inhalation Q6H  . methylPREDNISolone (SOLU-MEDROL) injection  40 mg Intravenous Q12H  . vitamin C  500 mg Oral Daily  . zinc sulfate  220 mg Oral Daily   Continuous Infusions: . remdesivir 100 mg in NS 250 mL 100 mg (01/27/19 1647)   PRN Meds:.acetaminophen, chlorpheniramine-HYDROcodone, guaiFENesin-dextromethorphan, [DISCONTINUED] ondansetron **OR** ondansetron (ZOFRAN) IV, [START ON 01/31/2019] pneumococcal 23 valent vaccine  Antibiotics  :    Anti-infectives (From admission, onward)   Start     Dose/Rate Route Frequency Ordered Stop   01/25/19 1600  remdesivir 100 mg in sodium chloride 0.9 % 250 mL IVPB     100 mg 500 mL/hr over 30 Minutes Intravenous Every 24 hours 01/24/19 2214 01/29/19 1559   01/24/19 2245  remdesivir 200 mg in sodium chloride 0.9 % 250 mL IVPB     200 mg 500 mL/hr over 30 Minutes Intravenous Once 01/24/19 2214 01/25/19 0040       Objective:   Vitals:   01/27/19 2002 01/28/19 0348 01/28/19 0354 01/28/19 0821  BP: (!) 109/57  110/61   Pulse: 62  (!) 58 63  Resp: 18  18 20   Temp: 97.8 F (36.6 C) 97.6 F (36.4 C)  (!) 97 F (36.1 C)   TempSrc: Oral Oral  Axillary  SpO2: 92%  92% 90%  Weight:      Height:        Wt Readings from Last 3 Encounters:  01/24/19 57.8 kg  01/24/19 57.8 kg  07/16/18 61.2 kg     Intake/Output Summary (Last 24 hours) at 01/28/2019 1421 Last data filed at 01/28/2019 0350 Gross per 24 hour  Intake 240 ml  Output 400 ml  Net -160 ml     Physical Exam Awake Alert, Oriented X 3, No new F.N deficits, Normal affect [place].AT,PERRAL Supple Neck,No JVD, No cervical lymphadenopathy appriciated.  Symmetrical Chest wall movement, Good air movement bilaterally, CTAB RRR,No Gallops,Rubs or new Murmurs, No Parasternal Heave +ve B.Sounds, Abd Soft, No tenderness, No organomegaly appriciated, No rebound - guarding or rigidity. No Cyanosis, Clubbing or edema, No new rashes or bruises       Data Review:    CBC Recent Labs  Lab 01/24/19 1503 01/25/19 0530 01/26/19 0600 01/27/19 0530 01/28/19 0131  WBC 8.4 5.4 15.7* 17.7* 17.6*  HGB 13.6 12.1* 12.2* 12.2* 13.4  HCT 39.7 35.5* 35.4* 36.0* 38.8*  PLT 208 223 307 347 395  MCV 93.0 94.9 94.1 94.5 93.7  MCH 31.9 32.4 32.4 32.0 32.4  MCHC 34.3 34.1 34.5 33.9 34.5  RDW 12.7 12.6 12.8 13.1 12.7  LYMPHSABS  --  0.8 1.7 1.3 1.2  MONOABS  --  0.3 1.0 0.9 0.8  EOSABS  --  0.0 0.0 0.0 0.0  BASOSABS  --  0.0 0.0  0.1 0.1    Chemistries  Recent Labs  Lab 01/24/19 1557 01/25/19 0530 01/26/19 0600 01/27/19 0530 01/28/19 0131 01/28/19 1230  NA 132* 129* 132* 135 137  --   K 5.0 4.7 4.3 4.9 4.9  --   CL 100 101 101 102 102  --   CO2 20* 17* 20* 22 24  --   GLUCOSE 195* 381* 169* 157* 64* 419*  BUN 30* 34* 33* 32* 30*  --   CREATININE 1.06 1.04 0.80 0.88 0.82  --   CALCIUM 8.5* 7.8* 8.6* 9.0 9.5  --   AST 34 26 30 21 21   --   ALT 26 24 29 27 29   --   ALKPHOS 106 95 91 95 105  --   BILITOT 0.8 0.8 0.5 0.6 0.8  --    ------------------------------------------------------------------------------------------------------------------ No  results for input(s): CHOL, HDL, LDLCALC, TRIG, CHOLHDL, LDLDIRECT in the last 72 hours.  Lab Results  Component Value Date   HGBA1C 7.9 (H) 01/25/2019  ------------------------------------------------------------------------------------------------------------------    Component Value Date/Time   BNP 940.6 (H) 01/27/2019 0530    Micro Results No results found for this or any previous visit (from the past 240 hour(s)).  Radiology Reports Dg Chest Port 1 View  Result Date: 01/24/2019 CLINICAL DATA:  COVID-19 positive.  Weakness. EXAM: PORTABLE CHEST 1 VIEW COMPARISON:  08/16/2014 FINDINGS: Postop CABG.  Negative for heart failure. Subtle airspace disease is present in the right upper lobe and right lower lobe as well as the left lower lobe. No collapse. No significant pleural effusion. No acute skeletal abnormality. IMPRESSION: Subtle bilateral airspace disease may represent pneumonia. Recommend CT chest with contrast for further evaluation. Electronically Signed   By: Franchot Gallo M.D.   On: 01/24/2019 15:48    Time Spent in minutes  [place] DO  To page go to [email or web address] - password [email or web address] [facility] -  Office  [phone] See all Orders from today for further details

## 2019-01-28 NOTE — Progress Notes (Addendum)
BG this AM 68, breakfast tray arrived. Patient ate breakfast repeat BG 132  11:30 paged Dr Avon Gully regarding patient's need for home O2 and social work order for set up. Also spoke with patient's wife Matthew Wolf and updated on plan of care.   12:00 Patient not to be discharged at this time per Dr Avon Gully. Will monitor O2 and blood sugar over night. Patient notified. Patient stated he will call his wife Matthew Wolf. This Probation officer also attempted to call for update. No answer at this time. Patient has cell phone at bedside.

## 2019-01-29 LAB — GLUCOSE, CAPILLARY: Glucose-Capillary: 83 mg/dL (ref 70–99)

## 2019-01-29 LAB — COMPREHENSIVE METABOLIC PANEL
ALT: 35 U/L (ref 0–44)
AST: 29 U/L (ref 15–41)
Albumin: 3.3 g/dL — ABNORMAL LOW (ref 3.5–5.0)
Alkaline Phosphatase: 109 U/L (ref 38–126)
Anion gap: 12 (ref 5–15)
BUN: 37 mg/dL — ABNORMAL HIGH (ref 8–23)
CO2: 23 mmol/L (ref 22–32)
Calcium: 9.2 mg/dL (ref 8.9–10.3)
Chloride: 99 mmol/L (ref 98–111)
Creatinine, Ser: 0.8 mg/dL (ref 0.61–1.24)
GFR calc Af Amer: >60 mL/min (ref >60)
GFR calc non Af Amer: >60 mL/min (ref >60)
Glucose, Bld: 80 mg/dL (ref 70–99)
Potassium: 5.3 mmol/L — ABNORMAL HIGH (ref 3.5–5.1)
Sodium: 134 mmol/L — ABNORMAL LOW (ref 135–145)
Total Bilirubin: 0.9 mg/dL (ref 0.3–1.2)
Total Protein: 6.6 g/dL (ref 6.5–8.1)

## 2019-01-29 LAB — CBC WITH DIFFERENTIAL/PLATELET
Abs Immature Granulocytes: 0.28 10*3/uL — ABNORMAL HIGH (ref 0.00–0.07)
Basophils Absolute: 0.1 10*3/uL (ref 0.0–0.1)
Basophils Relative: 0 %
Eosinophils Absolute: 0 10*3/uL (ref 0.0–0.5)
Eosinophils Relative: 0 %
HCT: 41 % (ref 39.0–52.0)
Hemoglobin: 14 g/dL (ref 13.0–17.0)
Immature Granulocytes: 1 %
Lymphocytes Relative: 6 %
Lymphs Abs: 1.2 10*3/uL (ref 0.7–4.0)
MCH: 32.2 pg (ref 26.0–34.0)
MCHC: 34.1 g/dL (ref 30.0–36.0)
MCV: 94.3 fL (ref 80.0–100.0)
Monocytes Absolute: 0.7 10*3/uL (ref 0.1–1.0)
Monocytes Relative: 3 %
Neutro Abs: 17.8 10*3/uL — ABNORMAL HIGH (ref 1.7–7.7)
Neutrophils Relative %: 90 %
Platelets: 423 10*3/uL — ABNORMAL HIGH (ref 150–400)
RBC: 4.35 MIL/uL (ref 4.22–5.81)
RDW: 12.5 % (ref 11.5–15.5)
WBC: 20 10*3/uL — ABNORMAL HIGH (ref 4.0–10.5)
nRBC: 0 % (ref 0.0–0.2)

## 2019-01-29 LAB — D-DIMER, QUANTITATIVE: D-Dimer, Quant: 0.9 ug/mL-FEU — ABNORMAL HIGH (ref 0.00–0.50)

## 2019-01-29 LAB — C-REACTIVE PROTEIN: CRP: 2.3 mg/dL — ABNORMAL HIGH (ref <1.0)

## 2019-01-29 MED ORDER — INFLUENZA VAC A&B SA ADJ QUAD 0.5 ML IM PRSY
0.5000 mL | PREFILLED_SYRINGE | INTRAMUSCULAR | Status: DC
  Filled 2019-01-29: qty 0.5

## 2019-01-29 MED ORDER — PREDNISONE 10 MG PO TABS: ORAL_TABLET | ORAL | 0 refills | Status: AC

## 2019-01-29 MED ORDER — INFLUENZA VAC A&B SA ADJ QUAD 0.5 ML IM PRSY
0.5000 mL | PREFILLED_SYRINGE | Freq: Once | INTRAMUSCULAR | Status: AC
  Administered 2019-01-29: 12:00:00 0.5 mL via INTRAMUSCULAR
  Filled 2019-01-29: qty 0.5

## 2019-01-29 NOTE — Progress Notes (Addendum)
Spoke with patient's wife Vickii Chafe. Updated on plan. Will call when patient's discharge paperwork is complete and patient can be transported home.   11:00 discussed potassium level with Dr Avon Gully. Instructed that patient may follow up with PCP regarding labwork.   12:59 Reviewed discharge instructions with patient. Educated on change made to home medication list and how to take prednisone taper. Instructed patient on where to pick up prescription. Educated patient how and when to use home oxygen. Patient educated on oxygen safety and how to use pulse oximeter. Patient given thermometer and pulse oximeter and instructed to use these routinely. All questions answered. Patient's wife Vickii Chafe will transport him home once his home O2 has been set up.

## 2019-01-29 NOTE — Discharge Summary (Signed)
Physician Discharge Summary  [name]:[id number] DOB: [birth year] DOA: 01/24/2019  PCP: [name], NP  Admit date: 01/24/2019 Discharge date: 01/29/2019  Admitted From: Home Disposition: Home with home health  Recommendations for Outpatient Follow-up:  1. Follow up within PCP in 1 week from discharge 2. Please obtain BMP/CBC within one week of discharge  Home Health: Yes Equipment/Devices: Oxygen, 2 L nasal cannula with ambulation, pulse ox  Discharge Condition: Stable CODE STATUS: Full Diet recommendation: As tolerated, diabetic  Brief/Interim Summary: Matthew Wolf a 73 y.o.malewith medical history significant ofckd, dm, htn comes in with cough and sob covid pos on 10/9. He currently denies sob, and is on RA w/ normal o2 sats. Denies any n/v/d. No chest pain or abd pain. No le edema or swelling. Pt referred for admission for pna on cxr caused by covid infection.  Patient admitted as above with acute hypoxic respiratory failure in the setting of COVID-19 pneumonia, now resolving, able to be weaned off of oxygen at rest after completing remdesivir course; patient does continue to be somewhat hypoxic with ambulation, requiring 2 L nasal cannula with ambulation but symptoms have all but resolved.  Patient continues to be on steroids, will continue steroid taper at discharge.  Patient otherwise feels quite well, otherwise agreeable and stable for discharge home.  Close follow-up with PCP in the next 3 to 5 days for repeat labs as well as to further evaluate hypoxia levels and need for ongoing oxygen or oxygen adjustment.  Patient educated that should he become more short of breath, at rest or with exertion, have fever, chills or worsening malaise he should report back to PCP or emergency department as indicated.  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)   Coronary artery disease   Acute respiratory failure due to COVID-19 [facility])    Discharge Instructions  Discharge Instructions    Call MD for:  difficulty breathing, headache or visual disturbances   Complete by: As directed    Call MD for:  extreme fatigue   Complete by: As directed    Call MD for:  hives   Complete by: As directed    Call MD for:  persistant dizziness or light-headedness   Complete by: As directed    Call MD for:  persistant nausea and vomiting   Complete by: As directed    Call MD for:  redness, tenderness, or signs of infection (pain, swelling, redness, odor or green/yellow discharge around incision site)   Complete by: As directed    Call MD for:  severe uncontrolled pain   Complete by: As directed    Call MD for:  temperature >100.4   Complete by: As directed    Diet - low sodium heart healthy   Complete by: As directed    Increase activity slowly   Complete by: As directed    MyChart COVID-19 home monitoring program   Complete by: Jan 29, 2019    Is the patient willing to use the [place] for home monitoring?: No     Allergies as of 01/29/2019   No Known Allergies     Medication List    TAKE these medications   aspirin EC 325 MG tablet Take 325 mg by mouth daily.   atenolol 50 MG tablet Commonly known as: TENORMIN Take 50 mg by mouth  daily.   atorvastatin 80 MG tablet Commonly known as: LIPITOR Take 80 mg by mouth at bedtime.   FISH OIL PO Take 1 capsule by mouth daily.   glipiZIDE 10 MG 24 hr tablet Commonly known as: GLUCOTROL XL TAKE 1 TABLET (10 MG TOTAL) BY MOUTH DAILY WITH BREAKFAST.   metFORMIN 500 MG tablet Commonly known as: GLUCOPHAGE Take 1 tablet (500 mg total) by mouth 2 (two) times daily with a meal. What changed: additional instructions   naproxen sodium 220 MG tablet Commonly known as: ALEVE Take 220 mg by mouth 2 (two) times daily as needed (pain).   nortriptyline 25 MG  capsule Commonly known as: PAMELOR TAKE 1 CAPSULE (25 MG TOTAL) BY MOUTH 2 (TWO) TIMES DAILY.   predniSONE 10 MG tablet Commonly known as: DELTASONE Take 4 tablets (40 mg total) by mouth daily for 3 days, THEN 3 tablets (30 mg total) daily for 3 days, THEN 2 tablets (20 mg total) daily for 3 days, THEN 1 tablet (10 mg total) daily for 3 days. Start taking on: January 29, 2019   ramipril 5 MG capsule Commonly known as: ALTACE Take 5 mg by mouth daily. Every morning            Durable Medical Equipment  (From admission, onward)         Start     Ordered   01/29/19 1044  For home use only DME oxygen  Once    Question Answer Comment  Length of Need 6 Months   Mode or (Route) Nasal cannula   Liters per Minute 2   Frequency Continuous (stationary and portable oxygen unit needed)   Oxygen conserving device No   Oxygen delivery system Gas      01/29/19 1043          No Known Allergies  Procedures/Studies: Dg Chest Port 1 View  Result Date: 01/24/2019 CLINICAL DATA:  COVID-19 positive.  Weakness. EXAM: PORTABLE CHEST 1 VIEW COMPARISON:  08/16/2014 FINDINGS: Postop CABG.  Negative for heart failure. Subtle airspace disease is present in the right upper lobe and right lower lobe as well as the left lower lobe. No collapse. No significant pleural effusion. No acute skeletal abnormality. IMPRESSION: Subtle bilateral airspace disease may represent pneumonia. Recommend CT chest with contrast for further evaluation. Electronically Signed   By: Marlan Palau M.D.   On: 01/24/2019 15:48    Subjective: No acute issues or events overnight, declines chest pain, nausea, vomiting, diarrhea, constipation, headache, fevers, chills.   Discharge Exam: Vitals:   01/29/19 0453 01/29/19 0718  BP: (!) 91/59   Pulse: 63   Resp: 16   Temp: 98.1 F (36.7 C) (!) 97.5 F (36.4 C)  SpO2: 90% 90%   Vitals:   01/28/19 1637 01/28/19 1916 01/29/19 0453 01/29/19 0718  BP: 116/84 102/61 (!)  91/59   Pulse: 68 66 63   Resp: 20 19 16    Temp: (!) 97.5 F (36.4 C) 97.9 F (36.6 C) 98.1 F (36.7 C) (!) 97.5 F (36.4 C)  TempSrc: Oral Oral Oral Oral  SpO2: 90% 91% 90% 90%  Weight:      Height:        General:  Pleasantly resting in bed, No acute distress. HEENT:  Normocephalic atraumatic.  Sclerae nonicteric, noninjected.  Extraocular movements intact bilaterally. Neck:  Without mass or deformity.  Trachea is midline. Lungs:  Clear to auscultate bilaterally without rhonchi, wheeze, or rales. Heart:  Regular rate and rhythm.  Without  murmurs, rubs, or gallops. Abdomen:  Soft, nontender, nondistended.  Without guarding or rebound. Extremities: Without cyanosis, clubbing, edema, or obvious deformity. Vascular:  Dorsalis pedis and posterior tibial pulses palpable bilaterally. Skin:  Warm and dry, no erythema, no ulcerations.   The results of significant diagnostics from this hospitalization (including imaging, microbiology, ancillary and laboratory) are listed below for reference.     Microbiology: No results found for this or any previous visit (from the past 240 hour(s)).   Labs: BNP (last 3 results) Recent Labs    01/25/19 0530 01/26/19 0600 01/27/19 0530  BNP 575.2* 852.1* 940.6*   Basic Metabolic Panel: Recent Labs  Lab 01/25/19 0530 01/26/19 0600 01/27/19 0530 01/28/19 0131 01/28/19 1230 01/29/19 0100  NA 129* 132* 135 137  --  134*  K 4.7 4.3 4.9 4.9  --  5.3*  CL 101 101 102 102  --  99  CO2 17* 20* 22 24  --  23  GLUCOSE 381* 169* 157* 64* 419* 80  BUN 34* 33* 32* 30*  --  37*  CREATININE 1.04 0.80 0.88 0.82  --  0.80  CALCIUM 7.8* 8.6* 9.0 9.5  --  9.2   Liver Function Tests: Recent Labs  Lab 01/25/19 0530 01/26/19 0600 01/27/19 0530 01/28/19 0131 01/29/19 0100  AST 26 30 21 21 29   ALT 24 29 27 29  35  ALKPHOS 95 91 95 105 109  BILITOT 0.8 0.5 0.6 0.8 0.9  PROT 6.3* 6.2* 6.1* 6.5 6.6  ALBUMIN 3.1* 3.0* 2.9* 3.1* 3.3*   CBC: Recent  Labs  Lab 01/25/19 0530 01/26/19 0600 01/27/19 0530 01/28/19 0131 01/29/19 0100  WBC 5.4 15.7* 17.7* 17.6* 20.0*  NEUTROABS 4.1 12.4* 15.1* 15.2* 17.8*  HGB 12.1* 12.2* 12.2* 13.4 14.0  HCT 35.5* 35.4* 36.0* 38.8* 41.0  MCV 94.9 94.1 94.5 93.7 94.3  PLT 223 307 347 395 423*   CBG: Recent Labs  Lab 01/28/19 0910 01/28/19 1150 01/28/19 1638 01/28/19 2137 01/29/19 0739  GLUCAP 132* 449* 288* 88 83   D-Dimer Recent Labs    01/28/19 0131 01/29/19 0100  DDIMER 0.79* 0.90*    Time coordinating discharge: Over 30 minutes  SIGNED:   Azucena FallenWilliam C Phill Steck, DO [facility] 01/29/2019, 10:59 AM Pager   If 7PM-7AM, please contact night-coverage [email or web address] Password [email or web address]

## 2019-01-29 NOTE — Discharge Instructions (Signed)
Oximetry Oximetry is a technology that measures the oxygen level in the blood. This measurement is taken with a device called an oximeter. This device may also measure the heart rate (pulse). The measurement helps to assess:  A person's oxygen level and breathing.  The need or effectiveness of oxygen therapy or other treatments for lung disease.  A person's ability to tolerate increased activity. If a more accurate measurement is required, a blood sample will be taken. How is an oximetry reading obtained?   A tape sensor or clip with a light source and a light detector will be placed on an area of the body: ? For children and adults, the sensor is usually placed on areas such as a finger, earlobe, or toe. ? For infants, a tape sensor is usually placed around areas such as the sole of a foot or the palm of a hand.  The device will beam light through the skin and blood. This cannot be felt.  The levels of light received by the detector will be measured and the percentage of blood cells carrying oxygen will be calculated. Oximetry may be used continuously or may be used at specified intervals. Are there any risks associated with oximetry? The risks associated with oximetry are rare. However, there is a risk of skin breakdown if a sensor is left in the same location for long periods of time. What can affect the accuracy of the oximetry reading? Certain factors or conditions can affect the accuracy of the measurements. They include:  Extreme warmth or coolness of the area where the sensor is located.  Excessive sweating of the area where the sensor is located.  Shivering or too much movement.  Poor blood flow to the area where the sensor is located.  Low hemoglobin or red blood cell levels (anemia).  A bone marrow disease that causes high levels of red blood cells, white blood cells, and platelets (polycythemia vera).  Chronic smoking and recent inhalation of smoke or carbon  monoxide.  Bright, artificial lighting.  Nail polish or artificial nails.  Very dark skin. What is the meaning of the oximetry reading? The normal value depends upon your medical history and your elevation above sea level.  Normal oxygen saturation levels are 90% or higher. Most healthy people have oxygen saturation levels between 95% and 100%.  Low oxygen saturation levels are below 90%. This may happen in people with lung conditions, such as chronic obstructive pulmonary disease (COPD). In this case, supplemental oxygen therapy may be needed. Summary  Oximetry uses a small device to measure the oxygen level in the blood.  The light in the sensor cannot be felt. The risks associated with oximetry are rare.  Normal oxygen levels are 90% or higher. Most healthy people have oxygen levels between 95% and 100%.  People with low oxygen levels may need supplemental oxygen. This information is not intended to replace advice given to you by your health care provider. Make sure you discuss any questions you have with your health care provider. Document Released: 06/12/2004 Document Revised: 07/15/2018 Document Reviewed: 03/27/2016 Elsevier Patient Education  2020 Elsevier Inc. Home Oxygen Use, Adult When a medical condition keeps you from getting enough oxygen, your health care provider may instruct you to take extra oxygen at home. Your health care provider will let you know:  When to take oxygen.  For how long to take oxygen.  How quickly oxygen should be delivered (flow rate), in liters per minute (LPM or L/M). Home oxygen  can be given through:  A mask.  A nasal cannula. This is a device or tube that goes in the nostrils.  A transtracheal catheter. This is a small, flexible tube placed in the trachea.  A tracheostomy. This is a surgically made opening in the trachea. These devices are connected with tubing to an oxygen source, such as:  A tank. Tanks hold oxygen in gas form.  They must be replaced when the oxygen is used up.  A liquid oxygen device. This holds oxygen in liquid form. It must be replaced when the oxygen is used up.  An oxygen concentrator machine. This filters oxygen in the room. It uses electricity, so you must have a backup cylinder of oxygen in case the power goes out. Supplies needed: To use oxygen, you will need:  A mask, nasal cannula, transtracheal catheter, or tracheostomy.  An oxygen tank, a liquid oxygen device, or an oxygen concentrator.  The tape that your health care provider recommends (optional). If you use a transtracheal catheter and your prescribed flow rate is 1 LPM or greater, you will also need a humidifier. Risks and complications  Fire. This can happen if the oxygen is exposed to a heat source, flame, or spark.  Injury to skin. This can happen if liquid oxygen touches your skin.  Organ damage. This can happen if you get too little oxygen. How to use oxygen Your health care provider or a representative from your [place] will show you how to use your oxygen device. Follow her or his instructions. The instructions may look something like this: 1. Wash your hands. 2. If you use an oxygen concentrator, make sure it is plugged in. 3. Place one end of the tube into the port on the tank, device, or machine. 4. Place the mask over your nose and mouth. Or, place the nasal cannula and secure it with tape if instructed. If you use a tracheostomy or transtracheal catheter, connect it to the oxygen source as directed. 5. Make sure the liter-flow setting on the machine is at the level prescribed by your health care provider. 6. Turn on the machine or adjust the knob on the tank or device to the correct liter-flow setting. 7. When you are done, turn off and unplug the machine, or turn the knob to OFF. How to clean and care for the oxygen supplies Nasal cannula  Clean it with a warm, wet cloth daily or as  needed.  Wash it with a liquid soap once a week.  Rinse it thoroughly once or twice a week.  Replace it every 2-4 weeks.  If you have an infection, such as a cold or pneumonia, change the cannula when you get better. Mask  Replace it every 2-4 weeks.  If you have an infection, such as a cold or pneumonia, change the mask when you get better. Humidifier bottle  Wash the bottle between each refill: ? Wash it with soap and warm water. ? Rinse it thoroughly. ? Disinfect it and its top. ? Air-dry it.  Make sure it is dry before you refill it. Oxygen concentrator  Clean the air filter at least twice a week according to directions from your home medical equipment and service company.  Wipe down the cabinet every day. To do this: ? Unplug the unit. ? Wipe down the cabinet with a damp cloth. ? Dry the cabinet. Other equipment  Change any extra tubing every 1-3 months.  Follow instructions from your health care provider  about taking care of any other equipment. Safety tips Fire safety tips   Keep your oxygen and oxygen supplies at least 5 ft away from sources of heat, flames, and sparks at all times.  Do not allow smoking near your oxygen. Put up "no smoking" signs in your home. Avoid smoking areas when in public.  Do not use materials that can burn (are flammable) while you use oxygen.  When you go to a restaurant with portable oxygen, ask to be seated in the nonsmoking section.  Keep a Government social research officer close by. Let your fire department know that you have oxygen in your home.  Test your home smoke detectors regularly. Traveling  Secure your oxygen tank in the vehicle so that it does not move around. Follow instructions from your medical device company about how to safely secure your tank.  Make sure you have enough oxygen for the amount of time you will be away from home.  If you are planning air travel, contact the airline to find out if they allow the use of an  approved portable oxygen concentrator. You may also need documents from your health care provider and medical device company before you travel. General safety tips  If you use an oxygen cylinder, make sure it is in a stand or secured to an object that will not move (fixed object).  If you use liquid oxygen, make sure its container is kept upright.  If you use an oxygen concentrator: ? Catering manager company. Make sure you are given priority service in the event that your power goes out. ? Avoid using extension cords, if possible. Follow these instructions at home:  Use oxygen only as told by your health care provider.  Do not use alcohol or other drugs that make you relax (sedating drugs) unless instructed. They can slow down your breathing rate and make it hard to get in enough oxygen.  Know how and when to order a refill of oxygen.  Always keep a spare tank of oxygen. Plan ahead for holidays when you may not be able to get a prescription filled.  Use water-based lubricants on your lips or nostrils. Do not use oil-based products like petroleum jelly.  To prevent skin irritation on your cheeks or behind your ears, tuck some gauze under the tubing. Contact a health care provider if:  You get headaches often.  You have shortness of breath.  You have a lasting cough.  You have anxiety.  You are sleepy all the time.  You develop an illness that affects your breathing.  You cannot exercise at your regular level.  You are restless.  You have difficult or irregular breathing, and it is getting worse.  You have a fever.  You have persistent redness under your nose. Get help right away if:  You are confused.  You have blue lips or fingernails.  You are struggling to breathe. Summary  Your health care provider or a representative from your medical device company will show you how to use your oxygen device. Follow her or his instructions.  If you use an oxygen  concentrator, make sure it is plugged in.  Make sure the liter-flow setting on the machine is at the level prescribed by your health care provider.  Keep your oxygen and oxygen supplies at least 5 ft away from sources of heat, flames, and sparks at all times. This information is not intended to replace advice given to you by your health care provider. Make sure  you discuss any questions you have with your health care provider. Document Released: 06/14/2003 Document Revised: 09/10/2017 Document Reviewed: 10/16/2015 Elsevier Patient Education  2020 [employer].

## 2019-01-29 NOTE — TOC Transition Note (Signed)
Transition of Care [facility]) - CM/SW Discharge Note   Patient Details  Name: RIGO LETTS MRN: [id number] Date of Birth: December 07, 1945  Transition of Care [facility]) CM/SW Contact:  Ninfa Meeker, RN Phone Number: [phone] (working remotely)   Clinical Narrative:  Patient is a 73yr old male w/ hx of BPH, CKD III, DM, diabetic peripheral neuropathy, HTN, heart trouble, HLD, hypertensive kidney disease, MI. arrived to ED w/ cough and SOB, was COVID + 10/09. Admitted to [facility] for further treatment.  Thankfully patient is improving an will discharge home. Patient's wife is being quarantined for COVID. Case Manager spoke with patient via telephone concerning discharge needs. He will need oxygen for home, saturations charted.  Address confirmed, patient's wife is at the home and she and son will pick him up after oxygen has been delivered. Referral called to Learta Codding, [employer]. [place] contacted to take a tank from [employer] and pulse ox to patient's room for discharge. MD requested to enter order for home oxygen.      Final next level of care: Home/Self Care Barriers to Discharge: No Barriers Identified   Patient Goals and CMS Choice Patient states their goals for this hospitalization and ongoing recovery are:: continue to recover CMS Medicare.gov Compare Post Acute Care list provided to:: Patient Choice offered to / list presented to : Patient  Discharge Placement                       Discharge Plan and Services   Discharge Planning Services: CM Consult Post Acute Care Choice: Durable Medical Equipment          DME Arranged: Oxygen DME Agency: [place] Agency: NA        Social Determinants of Health (SDOH) Interventions     Readmission Risk Interventions No flowsheet data found.

## 2019-01-31 ENCOUNTER — Ambulatory Visit: Payer: Self-pay | Admitting: Nurse Practitioner

## 2019-01-31 ENCOUNTER — Telehealth: Payer: Self-pay

## 2019-01-31 ENCOUNTER — Ambulatory Visit: Payer: Self-pay

## 2019-01-31 ENCOUNTER — Telehealth: Payer: Self-pay | Admitting: Nurse Practitioner

## 2019-01-31 LAB — GLUCOSE, CAPILLARY: Glucose-Capillary: 186 mg/dL — ABNORMAL HIGH (ref 70–99)

## 2019-01-31 NOTE — Telephone Encounter (Signed)
Copied from [place] [id number]. Topic: General - Other >> Jan 31, 2019 11:49 AM Antonieta Iba C wrote: Reason for CRM: pt's spouse called in to request a call back from provider / PCP. Pt was discharged from the hospital and they would like to discuss and update further.

## 2019-01-31 NOTE — Telephone Encounter (Signed)
Spoke to patient's wife about incident today where patient almost fell when going to bathroom.  Did advise his wife that patient should return to ER.  Currently he is on O2 and sat in 90's.  He refuses to go to ER, states "I am doing fine and I just need to pass my bowels".  He is frustrated about hospital putting him on insulin for short period, discussed at length with him importance of this during acute period.  Again reiterated importance of returning to ER as concerned about his current status and discussed this with his wife.  He said he will return if "does not feel good".

## 2019-01-31 NOTE — Telephone Encounter (Signed)
Called for transition of care management call since recently discharged from hospital on 01/29/2019, spoke with patients wife who states patient is very weak today and when he went to the bathroom this morning he "fell like a wet rag and starred off into the ceiling" states she was about to call 911 when he came back with it enough and stated that he did not want her to call 911 that he just wanted to lay back in the bed. She states she thought his blood sugar was low but doesn't have a way of checking it at home.   States he has not been up out of bed since this happened- advised her to call for EMS transport back to hospital for evaluation. Discussed with Jolene Cannady,NP who agrees with plan and will call patients wife as well and discuss need for him to return back to hospital.

## 2019-02-01 ENCOUNTER — Ambulatory Visit: Payer: Medicare HMO | Admitting: Nurse Practitioner

## 2019-02-09 ENCOUNTER — Emergency Department
Admission: EM | Admit: 2019-02-09 | Discharge: 2019-02-09 | Disposition: A | Discharge status: Home / Self Care | Payer: Medicare HMO | Attending: Emergency Medicine | Admitting: Emergency Medicine

## 2019-02-09 ENCOUNTER — Encounter: Payer: Self-pay | Admitting: Nurse Practitioner

## 2019-02-09 ENCOUNTER — Encounter: Payer: Self-pay | Admitting: Emergency Medicine

## 2019-02-09 ENCOUNTER — Other Ambulatory Visit: Payer: Self-pay

## 2019-02-09 ENCOUNTER — Emergency Department: Payer: Medicare HMO

## 2019-02-09 ENCOUNTER — Ambulatory Visit (INDEPENDENT_AMBULATORY_CARE_PROVIDER_SITE_OTHER): Payer: Medicare HMO | Admitting: Nurse Practitioner

## 2019-02-09 VITALS — BP 115/70 | HR 93 | Temp 97.8°F | Ht 67.0 in | Wt 133.0 lb

## 2019-02-09 DIAGNOSIS — E1165 Type 2 diabetes mellitus with hyperglycemia: Secondary | ICD-10-CM | POA: Diagnosis not present

## 2019-02-09 DIAGNOSIS — R531 Weakness: Secondary | ICD-10-CM

## 2019-02-09 DIAGNOSIS — N183 Chronic kidney disease, stage 3 unspecified: Secondary | ICD-10-CM | POA: Diagnosis not present

## 2019-02-09 DIAGNOSIS — I259 Chronic ischemic heart disease, unspecified: Secondary | ICD-10-CM | POA: Diagnosis not present

## 2019-02-09 DIAGNOSIS — Z7984 Long term (current) use of oral hypoglycemic drugs: Secondary | ICD-10-CM | POA: Insufficient documentation

## 2019-02-09 DIAGNOSIS — U071 COVID-19: Secondary | ICD-10-CM | POA: Diagnosis not present

## 2019-02-09 DIAGNOSIS — I252 Old myocardial infarction: Secondary | ICD-10-CM | POA: Diagnosis not present

## 2019-02-09 DIAGNOSIS — R739 Hyperglycemia, unspecified: Secondary | ICD-10-CM

## 2019-02-09 DIAGNOSIS — I129 Hypertensive chronic kidney disease with stage 1 through stage 4 chronic kidney disease, or unspecified chronic kidney disease: Secondary | ICD-10-CM | POA: Insufficient documentation

## 2019-02-09 DIAGNOSIS — J1289 Other viral pneumonia: Secondary | ICD-10-CM | POA: Diagnosis not present

## 2019-02-09 DIAGNOSIS — J1282 Pneumonia due to coronavirus disease 2019: Secondary | ICD-10-CM

## 2019-02-09 DIAGNOSIS — J168 Pneumonia due to other specified infectious organisms: Secondary | ICD-10-CM | POA: Diagnosis not present

## 2019-02-09 DIAGNOSIS — M6281 Muscle weakness (generalized): Secondary | ICD-10-CM | POA: Insufficient documentation

## 2019-02-09 DIAGNOSIS — R0602 Shortness of breath: Secondary | ICD-10-CM | POA: Diagnosis not present

## 2019-02-09 DIAGNOSIS — Z87891 Personal history of nicotine dependence: Secondary | ICD-10-CM | POA: Diagnosis not present

## 2019-02-09 LAB — BASIC METABOLIC PANEL
Anion gap: 12 (ref 5–15)
BUN: 22 mg/dL (ref 8–23)
CO2: 23 mmol/L (ref 22–32)
Calcium: 8.5 mg/dL — ABNORMAL LOW (ref 8.9–10.3)
Chloride: 94 mmol/L — ABNORMAL LOW (ref 98–111)
Creatinine, Ser: 0.94 mg/dL (ref 0.61–1.24)
GFR calc Af Amer: >60 mL/min (ref >60)
GFR calc non Af Amer: >60 mL/min (ref >60)
Glucose, Bld: 438 mg/dL — ABNORMAL HIGH (ref 70–99)
Potassium: 5.7 mmol/L — ABNORMAL HIGH (ref 3.5–5.1)
Sodium: 129 mmol/L — ABNORMAL LOW (ref 135–145)

## 2019-02-09 LAB — CBC
HCT: 37.3 % — ABNORMAL LOW (ref 39.0–52.0)
Hemoglobin: 12.7 g/dL — ABNORMAL LOW (ref 13.0–17.0)
MCH: 32.5 pg (ref 26.0–34.0)
MCHC: 34 g/dL (ref 30.0–36.0)
MCV: 95.4 fL (ref 80.0–100.0)
Platelets: 275 10*3/uL (ref 150–400)
RBC: 3.91 MIL/uL — ABNORMAL LOW (ref 4.22–5.81)
RDW: 12.7 % (ref 11.5–15.5)
WBC: 16.7 10*3/uL — ABNORMAL HIGH (ref 4.0–10.5)
nRBC: 0 % (ref 0.0–0.2)

## 2019-02-09 LAB — TROPONIN I (HIGH SENSITIVITY): Troponin I (High Sensitivity): 16 ng/L (ref <18)

## 2019-02-09 MED ORDER — SODIUM CHLORIDE 0.9 % IV BOLUS
1000.0000 mL | Freq: Once | INTRAVENOUS | Status: AC
  Administered 2019-02-09: 1000 mL via INTRAVENOUS

## 2019-02-09 NOTE — Patient Instructions (Signed)

## 2019-02-09 NOTE — ED Triage Notes (Signed)
Pt in via POV, sent over from PCP office due to decreased lung sounds.  Pt discharged from [facility] Friday.  Reports not feeling any better than when he was in hospital.  NAD noted at this time.

## 2019-02-09 NOTE — ED Notes (Signed)
Pt c.o SHOB, O2Sat 94%, placed on [facility] for comfort, Dr Charna Archer notified

## 2019-02-09 NOTE — ED Provider Notes (Signed)
[facility] Emergency Department Provider Note   ____________________________________________   First MD Initiated Contact with Patient 02/09/19 2124     (approximate)  I have reviewed the triage vital signs and the nursing notes.   HISTORY  Chief Complaint Covid+    HPI Matthew RakeJerry C Wolf is a 73 y.o. male with past medical history of CAD, hypertension, diabetes, and CKD who presents to the ED complaining of shortness of breath.  Patient reports he was [facility] and returned home 4 days ago.  He states he has occasionally been short of breath since then, which is about the same from the time of his discharge.  He states if he is at rest or not talking much that his breathing is okay, but he seems to get worse when up walking around.  He does state that he has been able to walk to get the mail and has generally been able to care for himself.  He does feel very weak at times and almost fell a few days ago when attempting go to the bathroom.  He was evaluated in his PCPs office today, where they recommended he be reevaluated in the ED.  He states he has not had any fevers or cough and denies any pain in his chest.  He has continued to to take steroids following his discharge, has 2 doses left.        Past Medical History:  Diagnosis Date  . BPH (benign prostatic hypertrophy)   . CKD (chronic kidney disease) stage 3, GFR 30-59 ml/min   . Diabetes (HCC)   . Diabetic peripheral neuropathy (HCC)   . HBP (high blood pressure)   . Heart trouble   . Hyperlipidemia   . Hypertensive kidney disease   . MI (myocardial infarction) (HCC) 1991, 1996    Patient Active Problem List   Diagnosis Date Noted  . Pneumonia due to COVID-19 virus 01/24/2019  . Acute respiratory failure due to COVID-19 (HCC) 01/24/2019  . Prostate cancer screening 03/16/2015  . Coronary artery disease 11/30/2014  . Essential  hypertension, benign   . Hyperlipidemia associated with type 2 diabetes mellitus (HCC)   . 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 Diabetic peripheral neuropathy [facility](HCC)     Past Surgical History:  Procedure Laterality Date  . ANGIOPLASTY     x 2  . CARDIAC SURGERY    . CATARACT EXTRACTION, BILATERAL  06/2015  . CORONARY ARTERY BYPASS GRAFT  1991  . HERNIA REPAIR      Prior to Admission medications   Medication Sig Start Date End Date Taking? Authorizing Provider  aspirin EC 325 MG tablet Take 325 mg by mouth daily.    [provider]  atenolol (TENORMIN) 50 MG tablet Take 50 mg by mouth daily.  03/20/13   [provider]  atorvastatin (LIPITOR) 80 MG tablet Take 80 mg by mouth at bedtime.  03/20/13   [provider]  glipiZIDE (GLUCOTROL XL) 10 MG 24 hr tablet TAKE 1 TABLET (10 MG TOTAL) BY MOUTH DAILY WITH BREAKFAST. 11/29/18   Cannady, Jolene T, NP  metFORMIN (GLUCOPHAGE) 500 MG tablet Take 1 tablet (500 mg total) by mouth 2 (two) times daily with a meal. Patient taking differently: Take 500 mg by mouth 2 (two) times daily with a meal. Pt states he takes 500mg  @ 1200, 1000mg  @ 1800 07/16/18   Cannady, Dorie RankJolene T, NP  naproxen sodium (ALEVE) 220 MG tablet Take 220 mg by mouth 2 (two) times daily as needed (pain).    [provider]  nortriptyline (PAMELOR) 25 MG capsule TAKE 1 CAPSULE (25 MG TOTAL) BY MOUTH 2 (TWO) TIMES DAILY. 11/11/18   Cannady, Henrine Screws T, NP  Omega-3 Fatty Acids (FISH OIL PO) Take 1 capsule by mouth daily.     [provider]  predniSONE (DELTASONE) 10 MG tablet Take 4 tablets (40 mg total) by mouth daily for 3 days, THEN 3 tablets (30 mg total) daily for 3 days, THEN 2 tablets (20 mg total) daily for 3 days, THEN 1 tablet (10 mg total) daily for 3 days. 01/29/19 02/10/19  Little Ishikawa, MD  ramipril (ALTACE) 5 MG capsule Take 5 mg by mouth daily. Every morning 07/12/14   [provider]    Allergies  Patient has no known allergies.  Family History  Problem Relation Age of Onset  . Diabetes Mother   . Heart disease Mother   . Hypertension Mother   . Emphysema Father   . Lung cancer Father   . Breast cancer Sister   . Heart disease Maternal Aunt   . Stroke Neg Hx     Social History Social History   Tobacco Use  . Smoking status: Former Smoker    Types: Cigarettes    Quit date: 04/07/1984    Years since quitting: 34.8  . Smokeless tobacco: Never Used  Substance Use Topics  . Alcohol use: No  . Drug use: No    Review of Systems  Constitutional: No fever/chills Eyes: No visual changes. ENT: No sore throat. Cardiovascular: Denies chest pain. Respiratory: Positive for shortness of breath. Gastrointestinal: No abdominal pain.  No nausea, no vomiting.  No diarrhea.  No constipation. Genitourinary: Negative for dysuria. Musculoskeletal: Negative for back pain. Skin: Negative for rash. Neurological: Negative for headaches, focal weakness or numbness.  Positive for generalized weakness.  ____________________________________________   PHYSICAL EXAM:  VITAL SIGNS: ED Triage Vitals  Enc Vitals Group     BP 02/09/19 1634 115/70     Pulse Rate 02/09/19 1634 83     Resp 02/09/19 1634 (!) 32     Temp 02/09/19 1634 (!) 97.4 F (36.3 C)     Temp Source 02/09/19 1634 Oral     SpO2 02/09/19 1634 96 %     Weight 02/09/19 1635 133 lb (60.3 kg)     Height 02/09/19 1635 5\' 7"  (1.702 m)     Head Circumference --      Peak Flow --      Pain Score 02/09/19 1642 0     Pain Loc --      Pain Edu? --      Excl. in [place]? --     Constitutional: Alert and oriented. Eyes: Conjunctivae are normal. Head: Atraumatic. Nose: No congestion/rhinnorhea. Mouth/Throat: Mucous membranes are moist. Neck: Normal ROM Cardiovascular: Normal rate, regular rhythm. Grossly normal heart sounds. Respiratory: Normal respiratory effort.  No retractions.  Crackles at bilateral bases. Gastrointestinal:  Soft and nontender. No distention. Genitourinary: deferred Musculoskeletal: No lower extremity tenderness nor edema. Neurologic:  Normal speech and language. No gross focal neurologic deficits are appreciated. Skin:  Skin is warm, dry and intact. No rash noted. Psychiatric: Mood and affect are normal. Speech and behavior are normal.  ____________________________________________   LABS (all labs ordered are listed, but only abnormal results are displayed)  Labs Reviewed  BASIC METABOLIC PANEL - Abnormal; Notable for  the following components:      Result Value   Sodium 129 (*)    Potassium 5.7 (*)    Chloride 94 (*)    Glucose, Bld 438 (*)    Calcium 8.5 (*)    All other components within normal limits  CBC - Abnormal; Notable for the following components:   WBC 16.7 (*)    RBC 3.91 (*)    Hemoglobin 12.7 (*)    HCT 37.3 (*)    All other components within normal limits  TROPONIN I (HIGH SENSITIVITY)  TROPONIN I (HIGH SENSITIVITY)   ____________________________________________  EKG  ED ECG REPORT I, Chesley Noon, the attending physician, personally viewed and interpreted this ECG.   Date: 02/09/2019  EKG Time: 16:38  Rate: 83  Rhythm: normal sinus rhythm  Axis: LAD  Intervals:none  ST&T Change: Inferior Q waves   PROCEDURES  Procedure(s) performed (including Critical Care):  Procedures   ____________________________________________   INITIAL IMPRESSION / ASSESSMENT AND PLAN / ED COURSE       73 year old male with history of CAD, hypertension, diabetes presents to the ED with ongoing shortness of breath following recent admission for COVID-19.  He states his shortness of breath has been about the same since he left the hospital but he has had some episodes of weakness.  He does get out of breath with walking and talking, was provided with a home oxygen machine at the time of discharge from [facility]  Chest x-ray appears worse compared to prior, however  would anticipate some delayed resolution of chest x-ray findings.  His labs are significant for hyperglycemia without evidence of DKA, likely secondary to the steroids he has been taking.  He does have some mild hyponatremia and hyperkalemia, will hydrate with IV fluids.  Troponin within normal limits, doubt ACS.  Will have patient ambulate here in the ED and if is able to do so without difficulty or significant hypoxia, he would be appropriate for discharge home with close follow-up.  Patient able to ambulate here and had no worsening shortness of breath, able to maintain O2 sats on 2 L nasal cannula, which he has available at home.  He was unsteady on his feet at times, but did not fall.  Given his ongoing weakness, patient was offered readmission to [facility], but he declines this.  He prefers to work on getting his strength back at home.  Counseled patient to follow-up closely with his PCP and return to the ED for any worsening symptoms, patient agrees with plan.      ____________________________________________   FINAL CLINICAL IMPRESSION(S) / ED DIAGNOSES  Final diagnoses:  Pneumonia due to COVID-19 virus  Generalized weakness  Hyperglycemia     ED Discharge Orders    None       Note:  This document was prepared using Dragon voice recognition software and may include unintentional dictation errors.   Chesley Noon, MD 02/09/19 2312

## 2019-02-09 NOTE — Progress Notes (Signed)
BP 115/70   Pulse 93   Temp 97.8 F (36.6 C) (Oral)   Ht  (1.702 m)   Wt 133 lb (60.3 kg)   SpO2 94%   BMI 20.83 kg/m    Subjective:    Patient ID: Matthew Wolf, male    DOB: 05-28-1945, 73 y.o.   MRN: [id number]  HPI: Matthew Wolf is a 73 y.o. male  Chief Complaint  Patient presents with  . Covid 19    f/u was discharged from hospital last saturday  . Shortness of Breath    chest congestion  . Fatigue   Transition of Care Hospital Follow up.  Covid positive testing initially on 01/14/2019.  He was admitted to [facility] on 01/24/2019 after worsening symptoms presented and discharged on 01/29/2019.  Reports he was discharged home with O2, which he was "to use as I needed", but reports he has not been using often.  He presents today for follow-up, but is expressing concern as continues to have SOB and fatigue.  He endorses incident on 01/31/2019 where he fell due to weakness at his house.  At that time had spoken to his wife and him on phone advising ER visit, but he did not attend and refused.  He continues to report weakness and frustration at not feeling 100%.  Prior to this Matthew Wolf was very active.  Has underlying T2DM with A1C in July 7.5%, recent hospital A1C in October 7.9%.  Was discharged with Prednisone and reports he has two days of this left.  HOSPITAL COURSE: "Brief/Interim Summary: Matthew Wolf a 73 y.o.malewith medical history significant ofckd, dm, htn comes in with cough and sob covid pos on 10/9. He currently denies sob, and is on RA w/ normal o2 sats. Denies any n/v/d. No chest pain or abd pain. No le edema or swelling. Pt referred for admission for pna on cxr caused by covid infection.  Patient admitted as above with acute hypoxic respiratory failure in the setting of COVID-19 pneumonia, now resolving, able to be weaned off of oxygen at rest after completing remdesivir course; patient does continue to be somewhat hypoxic with ambulation,  requiring 2 L nasal cannula with ambulation but symptoms have all but resolved.  Patient continues to be on steroids, will continue steroid taper at discharge.  Patient otherwise feels quite well, otherwise agreeable and stable for discharge home.  Close follow-up with PCP in the next 3 to 5 days for repeat labs as well as to further evaluate hypoxia levels and need for ongoing oxygen or oxygen adjustment.  Patient educated that should he become more short of breath, at rest or with exertion, have fever, chills or worsening malaise he should report back to PCP or emergency department as indicated."  H[facility] Physician: Dr. Natale Milch D/C Date: 01/29/2019  Records Requested: 02/09/2019 Records Received: 02/09/2019 Records Reviewed: 02/09/2019  Diagnoses on Discharge: Pneumonia due to COVID-19 virus  Date of interactive Contact within 48 hours of discharge:  Contact was through: phone  Date of 7 day or 14 day face-to-face visit:    within 14 days  Outpatient Encounter Medications as of 02/09/2019  Medication Sig Note  . aspirin EC 325 MG tablet Take 325 mg by mouth daily. 03/12/2015: Received from: [facility] Received Sig: Take by mouth.  [name] atenolol (TENORMIN) 50 MG tablet Take 50 mg by mouth daily.  04/27/2013: Received from: External Pharmacy  . atorvastatin (LIPITOR) 80 MG tablet Take  80 mg by mouth at bedtime.  04/27/2013: Received from: External Pharmacy  . glipiZIDE (GLUCOTROL XL) 10 MG 24 hr tablet TAKE 1 TABLET (10 MG TOTAL) BY MOUTH DAILY WITH BREAKFAST.   . metFORMIN (GLUCOPHAGE) 500 MG tablet Take 1 tablet (500 mg total) by mouth 2 (two) times daily with a meal. (Patient taking differently: Take 500 mg by mouth 2 (two) times daily with a meal. Pt states he takes  @ 1200,  @ 1800)   . naproxen sodium (ALEVE) 220 MG tablet Take 220 mg by mouth 2 (two) times daily as needed (pain).   . nortriptyline (PAMELOR) 25 MG capsule TAKE 1 CAPSULE  (25 MG TOTAL) BY MOUTH 2 (TWO) TIMES DAILY.   [name] Omega-3 Fatty Acids (FISH OIL PO) Take 1 capsule by mouth daily.    . predniSONE (DELTASONE) 10 MG tablet Take 4 tablets (40 mg total) by mouth daily for 3 days, THEN 3 tablets (30 mg total) daily for 3 days, THEN 2 tablets (20 mg total) daily for 3 days, THEN 1 tablet (10 mg total) daily for 3 days.   . ramipril (ALTACE) 5 MG capsule Take 5 mg by mouth daily. Every morning 10/13/2014: Received from: External Pharmacy   No facility-administered encounter medications on file as of 02/09/2019.     Diagnostic Tests Reviewed/Disposition:   Dg Chest Port 1 View  Result Date: 01/24/2019 CLINICAL DATA:  COVID-19 positive.  Weakness. EXAM: PORTABLE CHEST 1 VIEW COMPARISON:  08/16/2014 FINDINGS: Postop CABG.  Negative for heart failure. Subtle airspace disease is present in the right upper lobe and right lower lobe as well as the left lower lobe. No collapse. No significant pleural effusion. No acute skeletal abnormality. IMPRESSION: Subtle bilateral airspace disease may represent pneumonia. Recommend CT chest with contrast for further evaluation. Electronically Signed   By: Marlan Palau M.D.   On: 01/24/2019 15:48   BNP (last 3 results) Recent Labs (within last 365 days)[] Expand by Default       Recent Labs    01/25/19 0530 01/26/19 0600 01/27/19 0530  BNP 575.2* 852.1* 940.6*     Basic Metabolic Panel: Last Labs           Recent Labs  Lab 01/25/19 0530 01/26/19 0600 01/27/19 0530 01/28/19 0131 01/28/19 1230 01/29/19 0100  NA 129* 132* 135 137  --  134*  K 4.7 4.3 4.9 4.9  --  5.3*  CL 101 101 102 102  --  99  CO2 17* 20* 22 24  --  23  GLUCOSE 381* 169* 157* 64* 419* 80  BUN 34* 33* 32* 30*  --  37*  CREATININE 1.04 0.80 0.88 0.82  --  0.80  CALCIUM 7.8* 8.6* 9.0 9.5  --  9.2     Liver Function Tests: Last Labs          Recent Labs  Lab 01/25/19 0530 01/26/19 0600 01/27/19 0530 01/28/19 0131 01/29/19 0100  AST ALT 35  ALKPHOS 95 91 95 105 109  BILITOT 0.8 0.5 0.6 0.8 0.9  PROT 6.3* 6.2* 6.1* 6.5 6.6  ALBUMIN 3.1* 3.0* 2.9* 3.1* 3.3*     CBC: Last Labs          Recent Labs  Lab 01/25/19 0530 01/26/19 0600 01/27/19 0530 01/28/19 0131 01/29/19 0100  WBC 5.4 15.7* 17.7* 17.6* 20.0*  NEUTROABS 4.1 12.4* 15.1* 15.2* 17.8*  HGB 12.1* 12.2* 12.2* 13.4 14.0  HCT  35.5* 35.4* 36.0* 38.8* 41.0  MCV 94.9 94.1 94.5 93.7 94.3  PLT 223 307 347 395 423*     CBG: Last Labs Expand by Default         Recent Labs  Lab 01/28/19 0910 01/28/19 1150 01/28/19 1638 01/28/19 2137 01/29/19 0739  GLUCAP 132* 449* 288* 88 83     D-Dimer Recent Labs (last 2 labs) Expand by Default      Recent Labs    01/28/19 0131 01/29/19 0100  DDIMER 0.79* 0.90*     Consults: none  Discharge Instructions: Follow-up with PCP in one week  Disease/illness Education: Discussed with patient today  Home Health/Community Services Discussions/Referrals: Had been receiving home health and home O2  Establishment or re-establishment of referral orders for community resources: None  Discussion with other health care providers: Reviewed notes  Assessment and Support of treatment regimen adherence: Reviewed with patient  Appointments Coordinated with: Reviewed with patient  Education for self-management, independent living, and ADLs: Reviewed with patient  Relevant past medical, surgical, family and social history reviewed and updated as indicated. Interim medical history since our last visit reviewed. Allergies and medications reviewed and updated.  Review of Systems  Constitutional: Positive for fatigue. Negative for activity change, diaphoresis and fever.  HENT: Negative for congestion, postnasal drip, rhinorrhea, sinus pressure, sinus pain and sneezing.   Respiratory: Positive for cough, chest tightness and shortness of breath. Negative for wheezing.   Cardiovascular: Negative for  chest pain, palpitations and leg swelling.  Gastrointestinal: Negative for abdominal distention, abdominal pain, constipation, diarrhea, nausea and vomiting.  Endocrine: Negative for cold intolerance, heat intolerance, polydipsia, polyphagia and polyuria.  Neurological: Positive for weakness. Negative for dizziness, syncope, light-headedness, numbness and headaches.  Psychiatric/Behavioral: Negative.     Per HPI unless specifically indicated above     Objective:    BP 115/70   Pulse 93   Temp 97.8 F (36.6 C) (Oral)   Ht  (1.702 m)   Wt 133 lb (60.3 kg)   SpO2 94%   BMI 20.83 kg/m   Wt Readings from Last 3 Encounters:  02/09/19 133 lb (60.3 kg)  02/09/19 133 lb (60.3 kg)  01/24/19 127 lb 6.8 oz (57.8 kg)    Physical Exam Vitals signs and nursing note reviewed.  Constitutional:      General: He is awake. He is not in acute distress.    Appearance: He is well-developed. He is ill-appearing.  HENT:     Head: Normocephalic and atraumatic.     Right Ear: Hearing normal. No drainage.     Left Ear: Hearing normal. No drainage.  Eyes:     General: Lids are normal.        Right eye: No discharge.        Left eye: No discharge.     Conjunctiva/sclera: Conjunctivae normal.     Pupils: Pupils are equal, round, and reactive to light.  Neck:     Musculoskeletal: Normal range of motion and neck supple.     Vascular: No carotid bruit.  Cardiovascular:     Rate and Rhythm: Normal rate and regular rhythm.     Heart sounds: Normal heart sounds, S1 normal and S2 normal. No murmur. No gallop.   Pulmonary:     Effort: Accessory muscle usage present. No respiratory distress.     Breath sounds: Decreased breath sounds present.     Comments: Decreased sounds to bases bilaterally.  SOB noted with talking and short walk down  hallway.  O2 sat maintained in 90's with ambulating.   Abdominal:     General: Bowel sounds are normal.     Palpations: Abdomen is soft. There is no hepatomegaly  or splenomegaly.  Musculoskeletal: Normal range of motion.     Right lower leg: No edema.     Left lower leg: No edema.  Skin:    General: Skin is warm and dry.     Capillary Refill: Capillary refill takes less than 2 seconds.  Neurological:     Mental Status: He is alert and oriented to person, place, and time.  Psychiatric:        Mood and Affect: Affect is tearful.        Behavior: Behavior normal. Behavior is cooperative.        Thought Content: Thought content normal.        Judgment: Judgment normal.     Comments: Tearful affect present, at baseline is very outgoing.     Results for orders placed or performed during the hospital encounter of 01/24/19  CBC with Differential/Platelet  Result Value Ref Range   WBC 5.4 4.0 - 10.5 K/uL   RBC 3.74 (L) 4.22 - 5.81 MIL/uL   Hemoglobin 12.1 (L) 13.0 - 17.0 g/dL   HCT 16.135.5 (L) 09.639.0 - 04.552.0 %   MCV 94.9 80.0 - 100.0 fL   MCH 32.4 26.0 - 34.0 pg   MCHC 34.1 30.0 - 36.0 g/dL   RDW 40.912.6 81.111.5 - 91.415.5 %   Platelets 223 150 - 400 K/uL   nRBC 0.0 0.0 - 0.2 %   Neutrophils Relative % 75 %   Neutro Abs 4.1 1.7 - 7.7 K/uL   Lymphocytes Relative 14 %   Lymphs Abs 0.8 0.7 - 4.0 K/uL   Monocytes Relative 5 %   Monocytes Absolute 0.3 0.1 - 1.0 K/uL   Eosinophils Relative 0 %   Eosinophils Absolute 0.0 0.0 - 0.5 K/uL   Basophils Relative 0 %   Basophils Absolute 0.0 0.0 - 0.1 K/uL   Immature Granulocytes 6 %   Abs Immature Granulocytes 0.31 (H) 0.00 - 0.07 K/uL  Comprehensive metabolic panel  Result Value Ref Range   Sodium 129 (L) 135 - 145 mmol/L   Potassium 4.7 3.5 - 5.1 mmol/L   Chloride 101 98 - 111 mmol/L   CO2 17 (L) 22 - 32 mmol/L   Glucose, Bld 381 (H) 70 - 99 mg/dL   BUN 34 (H) 8 - 23 mg/dL   Creatinine, Ser 7.821.04 0.61 - 1.24 mg/dL   Calcium 7.8 (L) 8.9 - 10.3 mg/dL   Total Protein 6.3 (L) 6.5 - 8.1 g/dL   Albumin 3.1 (L) 3.5 - 5.0 g/dL   AST 26 15 - 41 U/L   ALT 24 0 - 44 U/L   Alkaline Phosphatase 95 38 - 126 U/L    Total Bilirubin 0.8 0.3 - 1.2 mg/dL   GFR calc non Af Amer >60 >60 mL/min   GFR calc Af Amer >60 >60 mL/min   Anion gap 11 5 - 15  C-reactive protein  Result Value Ref Range   CRP 11.2 (H) <1.0 mg/dL  D-dimer, quantitative (not at [facility])  Result Value Ref Range   D-Dimer, Quant 1.23 (H) 0.00 - 0.50 ug/mL-FEU  Hemoglobin A1c  Result Value Ref Range   Hgb A1c MFr Bld 7.9 (H) 4.8 - 5.6 %   Mean Plasma Glucose 180.03 mg/dL  Glucose, capillary  Result Value Ref Range  Glucose-Capillary 296 (H) 70 - 99 mg/dL  Brain natriuretic peptide  Result Value Ref Range   B Natriuretic Peptide 575.2 (H) 0.0 - 100.0 pg/mL  Glucose, capillary  Result Value Ref Range   Glucose-Capillary 348 (H) 70 - 99 mg/dL  Osmolality  Result Value Ref Range   Osmolality 297 (H) 275 - 295 mOsm/kg  Creatinine, urine, random  Result Value Ref Range   Creatinine, Urine 30.81 mg/dL  Sodium, urine, random  Result Value Ref Range   Sodium, Ur 41 mmol/L  Osmolality, urine  Result Value Ref Range   Osmolality, Ur 582 300 - 900 mOsm/kg  Uric acid  Result Value Ref Range   Uric Acid, Serum 6.1 3.7 - 8.6 mg/dL  Glucose, capillary  Result Value Ref Range   Glucose-Capillary 445 (H) 70 - 99 mg/dL  Glucose, capillary  Result Value Ref Range   Glucose-Capillary 410 (H) 70 - 99 mg/dL  CBC with Differential/Platelet  Result Value Ref Range   WBC 15.7 (H) 4.0 - 10.5 K/uL   RBC 3.76 (L) 4.22 - 5.81 MIL/uL   Hemoglobin 12.2 (L) 13.0 - 17.0 g/dL   HCT 16.1 (L) 09.6 - 04.5 %   MCV 94.1 80.0 - 100.0 fL   MCH 32.4 26.0 - 34.0 pg   MCHC 34.5 30.0 - 36.0 g/dL   RDW 40.9 81.1 - 91.4 %   Platelets 307 150 - 400 K/uL   nRBC 0.0 0.0 - 0.2 %   Neutrophils Relative % 78 %   Neutro Abs 12.4 (H) 1.7 - 7.7 K/uL   Lymphocytes Relative 11 %   Lymphs Abs 1.7 0.7 - 4.0 K/uL   Monocytes Relative 7 %   Monocytes Absolute 1.0 0.1 - 1.0 K/uL   Eosinophils Relative 0 %   Eosinophils Absolute 0.0 0.0 - 0.5 K/uL   Basophils Relative  0 %   Basophils Absolute 0.0 0.0 - 0.1 K/uL   Immature Granulocytes 4 %   Abs Immature Granulocytes 0.55 (H) 0.00 - 0.07 K/uL  Comprehensive metabolic panel  Result Value Ref Range   Sodium 132 (L) 135 - 145 mmol/L   Potassium 4.3 3.5 - 5.1 mmol/L   Chloride 101 98 - 111 mmol/L   CO2 20 (L) 22 - 32 mmol/L   Glucose, Bld 169 (H) 70 - 99 mg/dL   BUN 33 (H) 8 - 23 mg/dL   Creatinine, Ser 7.82 0.61 - 1.24 mg/dL   Calcium 8.6 (L) 8.9 - 10.3 mg/dL   Total Protein 6.2 (L) 6.5 - 8.1 g/dL   Albumin 3.0 (L) 3.5 - 5.0 g/dL   AST 30 15 - 41 U/L   ALT 29 0 - 44 U/L   Alkaline Phosphatase 91 38 - 126 U/L   Total Bilirubin 0.5 0.3 - 1.2 mg/dL   GFR calc non Af Amer >60 >60 mL/min   GFR calc Af Amer >60 >60 mL/min   Anion gap 11 5 - 15  C-reactive protein  Result Value Ref Range   CRP 7.4 (H) <1.0 mg/dL  D-dimer, quantitative (not at [facility])  Result Value Ref Range   D-Dimer, Quant 0.66 (H) 0.00 - 0.50 ug/mL-FEU  Brain natriuretic peptide  Result Value Ref Range   B Natriuretic Peptide 852.1 (H) 0.0 - 100.0 pg/mL  Glucose, capillary  Result Value Ref Range   Glucose-Capillary 235 (H) 70 - 99 mg/dL  Glucose, capillary  Result Value Ref Range   Glucose-Capillary 185 (H) 70 - 99 mg/dL  Glucose, capillary  Result Value Ref Range   Glucose-Capillary 447 (H) 70 - 99 mg/dL  CBC with Differential/Platelet  Result Value Ref Range   WBC 17.7 (H) 4.0 - 10.5 K/uL   RBC 3.81 (L) 4.22 - 5.81 MIL/uL   Hemoglobin 12.2 (L) 13.0 - 17.0 g/dL   HCT 65.7 (L) 84.6 - 96.2 %   MCV 94.5 80.0 - 100.0 fL   MCH 32.0 26.0 - 34.0 pg   MCHC 33.9 30.0 - 36.0 g/dL   RDW 95.2 84.1 - 32.4 %   Platelets 347 150 - 400 K/uL   nRBC 0.0 0.0 - 0.2 %   Neutrophils Relative % 86 %   Neutro Abs 15.1 (H) 1.7 - 7.7 K/uL   Lymphocytes Relative 7 %   Lymphs Abs 1.3 0.7 - 4.0 K/uL   Monocytes Relative 5 %   Monocytes Absolute 0.9 0.1 - 1.0 K/uL   Eosinophils Relative 0 %   Eosinophils Absolute 0.0 0.0 - 0.5 K/uL    Basophils Relative 0 %   Basophils Absolute 0.1 0.0 - 0.1 K/uL   Immature Granulocytes 2 %   Abs Immature Granulocytes 0.35 (H) 0.00 - 0.07 K/uL  Comprehensive metabolic panel  Result Value Ref Range   Sodium 135 135 - 145 mmol/L   Potassium 4.9 3.5 - 5.1 mmol/L   Chloride 102 98 - 111 mmol/L   CO2 22 22 - 32 mmol/L   Glucose, Bld 157 (H) 70 - 99 mg/dL   BUN 32 (H) 8 - 23 mg/dL   Creatinine, Ser 4.01 0.61 - 1.24 mg/dL   Calcium 9.0 8.9 - 02.7 mg/dL   Total Protein 6.1 (L) 6.5 - 8.1 g/dL   Albumin 2.9 (L) 3.5 - 5.0 g/dL   AST 21 15 - 41 U/L   ALT 27 0 - 44 U/L   Alkaline Phosphatase 95 38 - 126 U/L   Total Bilirubin 0.6 0.3 - 1.2 mg/dL   GFR calc non Af Amer >60 >60 mL/min   GFR calc Af Amer >60 >60 mL/min   Anion gap 11 5 - 15  C-reactive protein  Result Value Ref Range   CRP 3.6 (H) <1.0 mg/dL  D-dimer, quantitative (not at [facility])  Result Value Ref Range   D-Dimer, Quant 0.76 (H) 0.00 - 0.50 ug/mL-FEU  Brain natriuretic peptide  Result Value Ref Range   B Natriuretic Peptide 940.6 (H) 0.0 - 100.0 pg/mL  Glucose, capillary  Result Value Ref Range   Glucose-Capillary 338 (H) 70 - 99 mg/dL  Glucose, capillary  Result Value Ref Range   Glucose-Capillary 312 (H) 70 - 99 mg/dL  Glucose, capillary  Result Value Ref Range   Glucose-Capillary 179 (H) 70 - 99 mg/dL  Glucose, capillary  Result Value Ref Range   Glucose-Capillary 509 (HH) 70 - 99 mg/dL   Comment 1 NOTIFY RN   Glucose, capillary  Result Value Ref Range   Glucose-Capillary 413 (H) 70 - 99 mg/dL  Glucose, capillary  Result Value Ref Range   Glucose-Capillary 249 (H) 70 - 99 mg/dL  CBC with Differential/Platelet  Result Value Ref Range   WBC 17.6 (H) 4.0 - 10.5 K/uL   RBC 4.14 (L) 4.22 - 5.81 MIL/uL   Hemoglobin 13.4 13.0 - 17.0 g/dL   HCT 25.3 (L) 66.4 - 40.3 %   MCV 93.7 80.0 - 100.0 fL   MCH 32.4 26.0 - 34.0 pg   MCHC 34.5 30.0 - 36.0 g/dL   RDW 47.4 25.9 - 56.3 %  Platelets 395 150 - 400 K/uL   nRBC  0.0 0.0 - 0.2 %   Neutrophils Relative % 86 %   Neutro Abs 15.2 (H) 1.7 - 7.7 K/uL   Lymphocytes Relative 7 %   Lymphs Abs 1.2 0.7 - 4.0 K/uL   Monocytes Relative 5 %   Monocytes Absolute 0.8 0.1 - 1.0 K/uL   Eosinophils Relative 0 %   Eosinophils Absolute 0.0 0.0 - 0.5 K/uL   Basophils Relative 0 %   Basophils Absolute 0.1 0.0 - 0.1 K/uL   Immature Granulocytes 2 %   Abs Immature Granulocytes 0.36 (H) 0.00 - 0.07 K/uL  Comprehensive metabolic panel  Result Value Ref Range   Sodium 137 135 - 145 mmol/L   Potassium 4.9 3.5 - 5.1 mmol/L   Chloride 102 98 - 111 mmol/L   CO2 24 22 - 32 mmol/L   Glucose, Bld 64 (L) 70 - 99 mg/dL   BUN 30 (H) 8 - 23 mg/dL   Creatinine, Ser 9.60 0.61 - 1.24 mg/dL   Calcium 9.5 8.9 - 45.4 mg/dL   Total Protein 6.5 6.5 - 8.1 g/dL   Albumin 3.1 (L) 3.5 - 5.0 g/dL   AST 21 15 - 41 U/L   ALT 29 0 - 44 U/L   Alkaline Phosphatase 105 38 - 126 U/L   Total Bilirubin 0.8 0.3 - 1.2 mg/dL   GFR calc non Af Amer >60 >60 mL/min   GFR calc Af Amer >60 >60 mL/min   Anion gap 11 5 - 15  C-reactive protein  Result Value Ref Range   CRP 2.5 (H) <1.0 mg/dL  D-dimer, quantitative (not at [facility])  Result Value Ref Range   D-Dimer, Quant 0.79 (H) 0.00 - 0.50 ug/mL-FEU  Glucose, capillary  Result Value Ref Range   Glucose-Capillary 112 (H) 70 - 99 mg/dL  Glucose, capillary  Result Value Ref Range   Glucose-Capillary 68 (L) 70 - 99 mg/dL  Glucose, random  Result Value Ref Range   Glucose, Bld 419 (H) 70 - 99 mg/dL  Glucose, capillary  Result Value Ref Range   Glucose-Capillary 449 (H) 70 - 99 mg/dL  Glucose, capillary  Result Value Ref Range   Glucose-Capillary 132 (H) 70 - 99 mg/dL  CBC with Differential/Platelet  Result Value Ref Range   WBC 20.0 (H) 4.0 - 10.5 K/uL   RBC 4.35 4.22 - 5.81 MIL/uL   Hemoglobin 14.0 13.0 - 17.0 g/dL   HCT 09.8 11.9 - 14.7 %   MCV 94.3 80.0 - 100.0 fL   MCH 32.2 26.0 - 34.0 pg   MCHC 34.1 30.0 - 36.0 g/dL   RDW 82.9 56.2 -  13.0 %   Platelets 423 (H) 150 - 400 K/uL   nRBC 0.0 0.0 - 0.2 %   Neutrophils Relative % 90 %   Neutro Abs 17.8 (H) 1.7 - 7.7 K/uL   Lymphocytes Relative 6 %   Lymphs Abs 1.2 0.7 - 4.0 K/uL   Monocytes Relative 3 %   Monocytes Absolute 0.7 0.1 - 1.0 K/uL   Eosinophils Relative 0 %   Eosinophils Absolute 0.0 0.0 - 0.5 K/uL   Basophils Relative 0 %   Basophils Absolute 0.1 0.0 - 0.1 K/uL   Immature Granulocytes 1 %   Abs Immature Granulocytes 0.28 (H) 0.00 - 0.07 K/uL  Comprehensive metabolic panel  Result Value Ref Range   Sodium 134 (L) 135 - 145 mmol/L   Potassium 5.3 (H) 3.5 - 5.1 mmol/L  Chloride 99 98 - 111 mmol/L   CO2 23 22 - 32 mmol/L   Glucose, Bld 80 70 - 99 mg/dL   BUN 37 (H) 8 - 23 mg/dL   Creatinine, Ser 0.80 0.61 - 1.24 mg/dL   Calcium 9.2 8.9 - 10.3 mg/dL   Total Protein 6.6 6.5 - 8.1 g/dL   Albumin 3.3 (L) 3.5 - 5.0 g/dL   AST 29 15 - 41 U/L   ALT 35 0 - 44 U/L   Alkaline Phosphatase 109 38 - 126 U/L   Total Bilirubin 0.9 0.3 - 1.2 mg/dL   GFR calc non Af Amer >60 >60 mL/min   GFR calc Af Amer >60 >60 mL/min   Anion gap 12 5 - 15  C-reactive protein  Result Value Ref Range   CRP 2.3 (H) <1.0 mg/dL  D-dimer, quantitative (not at [facility])  Result Value Ref Range   D-Dimer, Quant 0.90 (H) 0.00 - 0.50 ug/mL-FEU  Glucose, capillary  Result Value Ref Range   Glucose-Capillary 288 (H) 70 - 99 mg/dL  Glucose, capillary  Result Value Ref Range   Glucose-Capillary 88 70 - 99 mg/dL  Glucose, capillary  Result Value Ref Range   Glucose-Capillary 83 70 - 99 mg/dL  Glucose, capillary  Result Value Ref Range   Glucose-Capillary 186 (H) 70 - 99 mg/dL  ABO/Rh  Result Value Ref Range   ABO/RH(D)      A POS Performed at [facility], [place] [address], [place], [place] [address]   Type and screen [place]  Result Value Ref Range   ABO/RH(D) A POS    Antibody Screen NEG    Sample Expiration      01/28/2019,2359 Performed  at  [facility], [place] [address], [place], [place] [address]       Assessment & Plan:   Problem List Items Addressed This Visit      Respiratory   Pneumonia due to COVID-19 virus - Primary    Acute with minimal improvement.  Concerns based on exam today.  Have recommended patient's wife take him into ER after this visit today for further evaluation based on exam findings and recent Covid admission.  Patient high risk for rapid decompensation.  His wife is agreeable and will be taking him to ER.          Follow up plan: Return after hospital visit.

## 2019-02-09 NOTE — ED Notes (Signed)
Pt reports that he has had increased SHOB since being discharged from [facility] and feels that he is getting weaker. Pt able to speak in complete sentences, has increased RR, appears lethargic and weak.

## 2019-02-09 NOTE — Assessment & Plan Note (Signed)
Acute with minimal improvement.  Concerns based on exam today.  Have recommended patient's wife take him into ER after this visit today for further evaluation based on exam findings and recent Covid admission.  Patient high risk for rapid decompensation.  His wife is agreeable and will be taking him to ER.

## 2019-02-10 ENCOUNTER — Telehealth: Payer: Self-pay | Admitting: Nurse Practitioner

## 2019-02-10 ENCOUNTER — Other Ambulatory Visit: Payer: Self-pay | Admitting: Nurse Practitioner

## 2019-02-10 DIAGNOSIS — E1122 Type 2 diabetes mellitus with diabetic chronic kidney disease: Secondary | ICD-10-CM

## 2019-02-10 DIAGNOSIS — N182 Chronic kidney disease, stage 2 (mild): Secondary | ICD-10-CM

## 2019-02-10 DIAGNOSIS — U071 COVID-19: Secondary | ICD-10-CM

## 2019-02-10 DIAGNOSIS — I129 Hypertensive chronic kidney disease with stage 1 through stage 4 chronic kidney disease, or unspecified chronic kidney disease: Secondary | ICD-10-CM

## 2019-02-10 NOTE — Telephone Encounter (Signed)
Patient notified of Jolene's message. Virtual visit scheduled for next Tuesday. Patient is also agreeable to getting our care team involved.

## 2019-02-10 NOTE — Telephone Encounter (Signed)
Copied from [place] [id number]. Topic: Appointment Scheduling - Scheduling Inquiry for Clinic >> Feb 10, 2019 10:50 AM Scherrie Gerlach wrote: Reason for CRM: pt needs hospital follow up appt from his ED visit yesterday.  However pt has had covid and still having sx.  But wife states he was allowed to come in yesterday, so I advised I could not make an in person visit, but d could approve.  Results from ED visit yesterday  Pt was given IV meds, and dx with PNA. Pt was dc'd home and advised to stay on O2. Also advised to walk. Wife states you can call the pt at home.

## 2019-02-10 NOTE — Telephone Encounter (Signed)
Sent referral in and messaged [place] and and [place].  We will see if a home health agency will go to his home, as feel he would benefit from both OT/PT during this recovery period.

## 2019-02-15 ENCOUNTER — Encounter: Payer: Self-pay | Admitting: Nurse Practitioner

## 2019-02-15 ENCOUNTER — Ambulatory Visit: Payer: Self-pay | Admitting: *Deleted

## 2019-02-15 ENCOUNTER — Other Ambulatory Visit: Payer: Self-pay | Admitting: Nurse Practitioner

## 2019-02-15 ENCOUNTER — Other Ambulatory Visit: Payer: Self-pay

## 2019-02-15 ENCOUNTER — Ambulatory Visit (INDEPENDENT_AMBULATORY_CARE_PROVIDER_SITE_OTHER): Payer: Medicare HMO | Admitting: Nurse Practitioner

## 2019-02-15 DIAGNOSIS — J9600 Acute respiratory failure, unspecified whether with hypoxia or hypercapnia: Secondary | ICD-10-CM

## 2019-02-15 DIAGNOSIS — U071 COVID-19: Secondary | ICD-10-CM

## 2019-02-15 DIAGNOSIS — J1282 Pneumonia due to coronavirus disease 2019: Secondary | ICD-10-CM

## 2019-02-15 DIAGNOSIS — J1289 Other viral pneumonia: Secondary | ICD-10-CM

## 2019-02-15 MED ORDER — ALBUTEROL SULFATE HFA 108 (90 BASE) MCG/ACT IN AERS: 2.0000 | INHALATION_SPRAY | Freq: Four times a day (QID) | RESPIRATORY_TRACT | 5 refills | Status: DC | PRN

## 2019-02-15 MED ORDER — BENZONATATE 200 MG PO CAPS: 200.0000 mg | ORAL_CAPSULE | Freq: Two times a day (BID) | ORAL | 0 refills | Status: DC | PRN

## 2019-02-15 MED ORDER — GUAIFENESIN 100 MG/5ML PO LIQD: 200.0000 mg | Freq: Three times a day (TID) | ORAL | 0 refills | Status: DC | PRN

## 2019-02-15 NOTE — Patient Instructions (Signed)

## 2019-02-15 NOTE — Assessment & Plan Note (Signed)
Ongoing with improvement reported at visit today.  Plan to continue weekly virtual follow-up visits until symptoms 100% improved.  Will repeat CXR in 6 weeks to assess PNA.  Scripts sent for Diabetic Tussin, Tessalon, and Albuterol inhaler today, which may benefit leftover cough and intermittent SOB.  Recommend continued frequent use of O2 at home.  Order placed for home health, PT/OT to assist with strengthening and endurance.  Return in one week.

## 2019-02-15 NOTE — Progress Notes (Signed)
Temp 97.6 F (36.4 C) (Temporal)   Ht 5\' 7"  (1.702 m)   Wt 130 lb (59 kg)   BMI 20.36 kg/m    Subjective:    Patient ID: Matthew Wolf, male    DOB: 19-Apr-1945, 73 y.o.   MRN: [id number]  HPI: Matthew Wolf is a 73 y.o. male  Chief Complaint  Patient presents with  . Cough    Covid 19 f/u    . This visit was completed via telephone due to the restrictions of the COVID-19 pandemic. All issues as above were discussed and addressed but no physical exam was performed. If it was felt that the patient should be evaluated in the office, they were directed there. The patient verbally consented to this visit. Patient was unable to complete an audio/visual visit due to Technical difficulties,Lack of internet. Due to the catastrophic nature of the COVID-19 pandemic, this visit was done through audio contact only. . Location of the patient: home . Location of the provider: work . Those involved with this call:  . Provider: 61, DNP . CMA: Matthew Wolf, CMA . Front Desk/Registration: Matthew Wolf  . Time spent on call: 15 minutes on the phone discussing health concerns. 10 minutes total spent in review of patient's record and preparation of their chart.  . I verified patient identity using two factors (patient name and date of birth). Patient consents verbally to being seen via telemedicine visit today.    COVID 19 FOLLOW-UP: Covid positive testing initially on 01/14/2019.  He was admitted to [facility] on 01/24/2019 after worsening symptoms presented and discharged on 01/29/2019.  Reports he was discharged home with O2, which he reports at this visit is using every day and previously was not using often.  States "I listened to you".  He reports his SOB is improving, able to walk to bank today without issue and without wearing O2.   Reports he is feeling better each day, only time he feels bad when he has the nagging cough.  Feeling about 75% better.  On recent return to ER  for visit CXR did note "Significant interval increase in the widespread areas of interstitial airspace opacity throughout the lungs, compatible with a multifocal pneumonia", ongoing changes with Covid.  He did not want to return to [facility] and opted to go home.  He endorses incident on 01/31/2019 where he fell due to weakness at his house.  At that time had spoken to his wife and him on phone advising ER visit, but he did not attend and refused.  No further falls since this time.   Wife reports he is eating like a horse.  Prior to Covid [name] was very active and independent.  Has underlying T2DM with A1C in July 7.5%, recent hospital A1C in October 7.9%. Was discharged with Prednisone from [facility] which he has now completed. Fever: no Cough: yes Shortness of breath: intermittent Wheezing: no Chest pain: no Chest tightness: no Chest congestion: no Nasal congestion: no Runny nose: no Post nasal drip: no Sneezing: no Sore throat: no Swollen glands: no Sinus pressure: no Headache: no Face pain: no Toothache: no Ear pain: none Ear pressure: none Eyes red/itching:no Eye drainage/crusting: no  Vomiting: no Rash: no Fatigue: yes Sick contacts: no Strep contacts: no  Context: stable   Relevant past medical, surgical, family and social history reviewed and updated as indicated. Interim medical history since our last visit reviewed. Allergies and medications reviewed and updated.  Review of Systems  Constitutional: Positive for fatigue. Negative for activity change, diaphoresis and fever.  Respiratory: Positive for cough and shortness of breath. Negative for chest tightness and wheezing.   Cardiovascular: Negative for chest pain, palpitations and leg swelling.  Gastrointestinal: Negative for abdominal distention, abdominal pain, constipation, diarrhea, nausea and vomiting.  Endocrine: Negative for cold intolerance, heat intolerance, polydipsia, polyphagia and polyuria.   Neurological: Negative for dizziness, syncope, weakness, light-headedness, numbness and headaches.  Psychiatric/Behavioral: Negative.     Per HPI unless specifically indicated above     Objective:    Temp 97.6 F (36.4 C) (Temporal)   Ht 5\' 7"  (1.702 m)   Wt 130 lb (59 kg)   BMI 20.36 kg/m   Wt Readings from Last 3 Encounters:  02/15/19 130 lb (59 kg)  02/09/19 133 lb (60.3 kg)  02/09/19 133 lb (60.3 kg)    Physical Exam   Unable to perform due to telephone visit only.  Results for orders placed or performed during the hospital encounter of 15/17/61  Basic metabolic panel  Result Value Ref Range   Sodium 129 (L) 135 - 145 mmol/L   Potassium 5.7 (H) 3.5 - 5.1 mmol/L   Chloride 94 (L) 98 - 111 mmol/L   CO2 23 22 - 32 mmol/L   Glucose, Bld 438 (H) 70 - 99 mg/dL   BUN 22 8 - 23 mg/dL   Creatinine, Ser 0.94 0.61 - 1.24 mg/dL   Calcium 8.5 (L) 8.9 - 10.3 mg/dL   GFR calc non Af Amer >60 >60 mL/min   GFR calc Af Amer >60 >60 mL/min   Anion gap 12 5 - 15  CBC  Result Value Ref Range   WBC 16.7 (H) 4.0 - 10.5 K/uL   RBC 3.91 (L) 4.22 - 5.81 MIL/uL   Hemoglobin 12.7 (L) 13.0 - 17.0 g/dL   HCT 37.3 (L) 39.0 - 52.0 %   MCV 95.4 80.0 - 100.0 fL   MCH 32.5 26.0 - 34.0 pg   MCHC 34.0 30.0 - 36.0 g/dL   RDW 12.7 11.5 - 15.5 %   Platelets 275 150 - 400 K/uL   nRBC 0.0 0.0 - 0.2 %  Troponin I (High Sensitivity)  Result Value Ref Range   Troponin I (High Sensitivity) 16 <18 ng/L      Assessment & Plan:   Problem List Items Addressed This Visit      Respiratory   Pneumonia due to COVID-19 virus    Ongoing with improvement reported at visit today.  Plan to continue weekly virtual follow-up visits until symptoms 100% improved.  Will repeat CXR in 6 weeks to assess PNA.  Scripts sent for Diabetic Tussin, Tessalon, and Albuterol inhaler today, which may benefit leftover cough and intermittent SOB.  Recommend continued frequent use of O2 at home.  Order placed for home health,  PT/OT to assist with strengthening and endurance.  Return in one week.      Relevant Medications   albuterol (VENTOLIN HFA) 108 (90 Base) MCG/ACT inhaler   guaiFENesin (DIABETIC TUSSIN EX) 100 MG/5ML liquid   benzonatate (TESSALON) 200 MG capsule      I discussed the assessment and treatment plan with the patient. The patient was provided an opportunity to ask questions and all were answered. The patient agreed with the plan and demonstrated an understanding of the instructions.   The patient was advised to call back or seek an in-person evaluation if the symptoms worsen or if the condition fails  to improve as anticipated.   I provided 15 minutes of time during this encounter.  Follow up plan: Return in about 1 week (around 02/22/2019) for Covid follow-up.

## 2019-02-15 NOTE — Chronic Care Management (AMB) (Signed)
Chronic Care Management   Initial Visit Note  02/15/2019 Name: Matthew Wolf MRN: [id number] DOB: 04/09/1945  Referred by: Matthew Lick, NP Reason for referral : Chronic Care Management (Covid-19) and Care Coordination (Home health )   Matthew Wolf is a 73 y.o. year old male who is a primary care patient of Matthew Wolf, Matthew Faster, NP. The CCM team was consulted for assistance with chronic disease management and care coordination needs related to HTN, DMII and recent covid-19 with pneumonia   Review of patient status, including review of consultants reports, relevant laboratory and other test results, and collaboration with appropriate care team members and the patient's provider was performed as part of comprehensive patient evaluation and provision of chronic care management services.    SDOH (Social Determinants of Health) screening performed today: Physical Activity. See Care Plan for related entries.   Medications: Outpatient Encounter Medications as of 02/15/2019  Medication Sig Note  . aspirin EC 325 MG tablet Take 325 mg by mouth daily. 03/12/2015: Received from: [facility] [facility]: Take by mouth.  [name] atenolol (TENORMIN) 50 MG tablet Take 50 mg by mouth daily.  04/27/2013: Received from: External Pharmacy  . atorvastatin (LIPITOR) 80 MG tablet Take 80 mg by mouth at bedtime.  04/27/2013: Received from: External Pharmacy  . glipiZIDE (GLUCOTROL XL) 10 MG 24 hr tablet TAKE 1 TABLET (10 MG TOTAL) BY MOUTH DAILY WITH BREAKFAST.   . metFORMIN (GLUCOPHAGE) 500 MG tablet Take 1 tablet (500 mg total) by mouth 2 (two) times daily with a meal. (Patient taking differently: Take 500 mg by mouth 2 (two) times daily with a meal. Pt states he takes 530m @ 1200, 10025m@ 1800)   . naproxen sodium (ALEVE) 220 MG tablet Take 220 mg by mouth 2 (two) times daily as needed (pain).   . nortriptyline (PAMELOR) 25 MG capsule TAKE 1 CAPSULE (25 MG TOTAL) BY MOUTH 2 (TWO) TIMES DAILY.    . Marland Kitchenmega-3 Fatty Acids (FISH OIL PO) Take 1 capsule by mouth daily.    . ramipril (ALTACE) 5 MG capsule Take 5 mg by mouth daily. Every morning 10/13/2014: Received from: External Pharmacy   No facility-administered encounter medications on file as of 02/15/2019.      Objective:   Goals Addressed            This Visit's Progress   . He is still so weak from having covid (pt-stated)       Current Barriers:  . Marland Kitchennowledge Deficits related to obtaining home health services  . Recent hospitalization with covid-19 and pneumonia complaining of continued weakness   Nurse Case Manager Clinical Goal(s):  . Marland Kitchenver the next 30 days, [facility] needs related to continued weakness and cough  Interventions:  . Provided education to patient re: the process of obtaining Home Health . Collaborated with PCP regarding patient's continued weakness and cough  . Discussed plans with patient for ongoing care management follow up and provided patient with direct contact information for care management team . Reviewed scheduled/upcoming provider appointments including: This afternoon virtual visit with PCP. . Marland Kitchennitial call with patient's spouse [name] reports patient is continuing to be weak and tired. She also reports patient still has a significant cough and is needing 02 intermittently when SOB and cough increases. Agreeable to home health for monitoring and to work with him to increase strength.  . PCP placed Home Health Referral  . Message sent to  referral coordinator   Patient Self Care Activities:  . Attends all scheduled provider appointments . Continued weakness from recent covid-19 and pneumonia Initial goal documentation         [name] was given information about Chronic Care Management services today including:  1. CCM service includes personalized support from designated clinical staff supervised by his physician, including individualized plan of care and  coordination with other care providers 2. 24/7 contact phone numbers for assistance for urgent and routine care needs. 3. Service will only be billed when office clinical staff spend 20 minutes or more in a month to coordinate care. 4. Only one practitioner may furnish and bill the service in a calendar month. 5. The patient may stop CCM services at any time (effective at the end of the month) by phone call to the office staff. 6. The patient will be responsible for cost sharing (co-pay) of up to 20% of the service fee (after annual deductible is met).  Patient agreed to services and verbal consent obtained.   Plan:   The care management team will reach out to the patient again over the next 30 days.  The patient has been provided with contact information for the care management team and has been advised to call with any health related questions or concerns.   Matthew Morse Doshie Maggi RN, BSN Nurse Case Editor, [facility]ing [facility]  [phone]) Business Mobile

## 2019-02-15 NOTE — Patient Instructions (Signed)
Thank you allowing the Chronic Care Management Team to be a part of your care! It was a pleasure speaking with you today!  CCM (Chronic Care Management) Team   Richanda Darin RN, BSN Nurse Care Coordinator  [phone]  [facility] PharmD  Clinical Pharmacist  [phone]  Eula Fried LCSW Clinical Social Worker [phone]  Goals Addressed            This Visit's Progress   . He is still so weak from having covid (pt-stated)       Current Barriers:  [name] Knowledge Deficits related to obtaining home health services  . Recent hospitalization with covid-19 and pneumonia complaining of continued weakness   Nurse Case Manager Clinical Goal(s):  [name] Over the next 30 days, patient will work with [facility] to address needs related to continued weakness and cough  Interventions:  . Provided education to patient re: the process of obtaining Home Health . Collaborated with PCP regarding patient's continued weakness and cough  . Discussed plans with patient for ongoing care management follow up and provided patient with direct contact information for care management team . Reviewed scheduled/upcoming provider appointments including: This afternoon virtual visit with PCP. [name] Initial call with patient's spouse Vickii Chafe she reports patient is continuing to be weak and tired. She also reports patient still has a significant cough and is needing 02 intermittently when SOB and cough increases. Spouse reports patient blood sugars within normal limits at this time. Agreeable to home health for monitoring and to work with him to increase strength.  . PCP placed Home Health Referral  . Message sent to referral coordinator   Patient Self Care Activities:  . Attends all scheduled provider appointments . Continued weakness from recent covid-19 and pneumonia Initial goal documentation        The patient verbalized understanding of instructions provided today and declined a print copy of patient  instruction materials.   The patient has been provided with contact information for the care management team and has been advised to call with any health related questions or concerns.

## 2019-02-16 ENCOUNTER — Ambulatory Visit (INDEPENDENT_AMBULATORY_CARE_PROVIDER_SITE_OTHER): Payer: Medicare HMO

## 2019-02-16 VITALS — HR 79 | Ht 67.0 in | Wt 130.0 lb

## 2019-02-16 DIAGNOSIS — Z0000 Encounter for general adult medical examination without abnormal findings: Secondary | ICD-10-CM | POA: Diagnosis not present

## 2019-02-16 NOTE — Patient Instructions (Signed)
Matthew Wolf , Thank you for taking time to come for your Medicare Wellness Visit. I appreciate your ongoing commitment to your health goals. Please review the following plan we discussed and let me know if I can assist you in the future.   Screening recommendations/referrals: Colonoscopy: no longer required Recommended yearly ophthalmology/optometry visit for glaucoma screening and checkup Recommended yearly dental visit for hygiene and checkup  Vaccinations: Influenza vaccine: up to date Pneumococcal vaccine: eligible for booster  Tdap vaccine: due now  Shingles vaccine: shingrix eligible     Advanced directives: Please bring a copy of your health care power of attorney and living will to the office at your convenience.  Conditions/risks identified: please use o2  2-3l at home to keep oxygen levels above 89%, especially when walking. Use inhaler as needed.   Next appointment:  Follow up in one year for your annual wellness visit   Preventive Care 65 Years and Older, Male Preventive care refers to lifestyle choices and visits with your health care provider that can promote health and wellness. What does preventive care include?  A yearly physical exam. This is also called an annual well check.  Dental exams once or twice a year.  Routine eye exams. Ask your health care provider how often you should have your eyes checked.  Personal lifestyle choices, including:  Daily care of your teeth and gums.  Regular physical activity.  Eating a healthy diet.  Avoiding tobacco and drug use.  Limiting alcohol use.  Practicing safe sex.  Taking low doses of aspirin every day.  Taking vitamin and mineral supplements as recommended by your health care provider. What happens during an annual well check? The services and screenings done by your health care provider during your annual well check will depend on your age, overall health, lifestyle risk factors, and family history of  disease. Counseling  Your health care provider may ask you questions about your:  Alcohol use.  Tobacco use.  Drug use.  Emotional well-being.  Home and relationship well-being.  Sexual activity.  Eating habits.  History of falls.  Memory and ability to understand (cognition).  Work and work Statistician. Screening  You may have the following tests or measurements:  Height, weight, and BMI.  Blood pressure.  Lipid and cholesterol levels. These may be checked every 5 years, or more frequently if you are over 17 years old.  Skin check.  Lung cancer screening. You may have this screening every year starting at age 59 if you have a 30-pack-year history of smoking and currently smoke or have quit within the past 15 years.  Fecal occult blood test (FOBT) of the stool. You may have this test every year starting at age 74.  Flexible sigmoidoscopy or colonoscopy. You may have a sigmoidoscopy every 5 years or a colonoscopy every 10 years starting at age 38.  Prostate cancer screening. Recommendations will vary depending on your family history and other risks.  Hepatitis C blood test.  Hepatitis B blood test.  Sexually transmitted disease (STD) testing.  Diabetes screening. This is done by checking your blood sugar (glucose) after you have not eaten for a while (fasting). You may have this done every 1-3 years.  Abdominal aortic aneurysm (AAA) screening. You may need this if you are a current or former smoker.  Osteoporosis. You may be screened starting at age 64 if you are at high risk. Talk with your health care provider about your test results, treatment options, and if necessary,  the need for more tests. Vaccines  Your health care provider may recommend certain vaccines, such as:  Influenza vaccine. This is recommended every year.  Tetanus, diphtheria, and acellular pertussis (Tdap, Td) vaccine. You may need a Td booster every 10 years.  Zoster vaccine. You may  need this after age 90.  Pneumococcal 13-valent conjugate (PCV13) vaccine. One dose is recommended after age 66.  Pneumococcal polysaccharide (PPSV23) vaccine. One dose is recommended after age 12. Talk to your health care provider about which screenings and vaccines you need and how often you need them. This information is not intended to replace advice given to you by your health care provider. Make sure you discuss any questions you have with your health care provider. Document Released: 04/20/2015 Document Revised: 12/12/2015 Document Reviewed: 01/23/2015 Elsevier Interactive Patient Education  2017 [place] Prevention in the Home Falls can cause injuries. They can happen to people of all ages. There are many things you can do to make your home safe and to help prevent falls. What can I do on the outside of my home?  Regularly fix the edges of walkways and driveways and fix any cracks.  Remove anything that might make you trip as you walk through a door, such as a raised step or threshold.  Trim any bushes or trees on the path to your home.  Use bright outdoor lighting.  Clear any walking paths of anything that might make someone trip, such as rocks or tools.  Regularly check to see if handrails are loose or broken. Make sure that both sides of any steps have handrails.  Any raised decks and porches should have guardrails on the edges.  Have any leaves, snow, or ice cleared regularly.  Use sand or salt on walking paths during winter.  Clean up any spills in your garage right away. This includes oil or grease spills. What can I do in the bathroom?  Use night lights.  Install grab bars by the toilet and in the tub and shower. Do not use towel bars as grab bars.  Use non-skid mats or decals in the tub or shower.  If you need to sit down in the shower, use a plastic, non-slip stool.  Keep the floor dry. Clean up any water that spills on the floor as soon as it  happens.  Remove soap buildup in the tub or shower regularly.  Attach bath mats securely with double-sided non-slip rug tape.  Do not have throw rugs and other things on the floor that can make you trip. What can I do in the bedroom?  Use night lights.  Make sure that you have a light by your bed that is easy to reach.  Do not use any sheets or blankets that are too big for your bed. They should not hang down onto the floor.  Have a firm chair that has side arms. You can use this for support while you get dressed.  Do not have throw rugs and other things on the floor that can make you trip. What can I do in the kitchen?  Clean up any spills right away.  Avoid walking on wet floors.  Keep items that you use a lot in easy-to-reach places.  If you need to reach something above you, use a strong step stool that has a grab bar.  Keep electrical cords out of the way.  Do not use floor polish or wax that makes floors slippery. If you must use  wax, use non-skid floor wax.  Do not have throw rugs and other things on the floor that can make you trip. What can I do with my stairs?  Do not leave any items on the stairs.  Make sure that there are handrails on both sides of the stairs and use them. Fix handrails that are broken or loose. Make sure that handrails are as long as the stairways.  Check any carpeting to make sure that it is firmly attached to the stairs. Fix any carpet that is loose or worn.  Avoid having throw rugs at the top or bottom of the stairs. If you do have throw rugs, attach them to the floor with carpet tape.  Make sure that you have a light switch at the top of the stairs and the bottom of the stairs. If you do not have them, ask someone to add them for you. What else can I do to help prevent falls?  Wear shoes that:  Do not have high heels.  Have rubber bottoms.  Are comfortable and fit you well.  Are closed at the toe. Do not wear sandals.  If you  use a stepladder:  Make sure that it is fully opened. Do not climb a closed stepladder.  Make sure that both sides of the stepladder are locked into place.  Ask someone to hold it for you, if possible.  Clearly mark and make sure that you can see:  Any grab bars or handrails.  First and last steps.  Where the edge of each step is.  Use tools that help you move around (mobility aids) if they are needed. These include:  Canes.  Walkers.  Scooters.  Crutches.  Turn on the lights when you go into a dark area. Replace any light bulbs as soon as they burn out.  Set up your furniture so you have a clear path. Avoid moving your furniture around.  If any of your floors are uneven, fix them.  If there are any pets around you, be aware of where they are.  Review your medicines with your doctor. Some medicines can make you feel dizzy. This can increase your chance of falling. Ask your doctor what other things that you can do to help prevent falls. This information is not intended to replace advice given to you by your health care provider. Make sure you discuss any questions you have with your health care provider. Document Released: 01/18/2009 Document Revised: 08/30/2015 Document Reviewed: 04/28/2014 Elsevier Interactive Patient Education  2017 [employer].

## 2019-02-16 NOTE — Progress Notes (Signed)
Subjective:   Matthew Wolf is a 73 y.o. male who presents for Medicare Annual/Subsequent preventive examination.  This visit is being conducted via phone call  - after an attempt to do on video chat - due to the COVID-19 pandemic. This patient has given me verbal consent via phone to conduct this visit, patient states they are participating from their home address. Some vital signs may be absent or patient reported.   Patient identification: identified by name, DOB, and current address.    Review of Systems:   Cardiac Risk Factors include: advanced age (>63men, >53 women);dyslipidemia;hypertension     Objective:    Vitals: Pulse 79   Ht 5\' 7"  (1.702 m)   Wt 130 lb (59 kg)   SpO2 94% Comment: 2/l o2  BMI 20.36 kg/m   Body mass index is 20.36 kg/m.  Advanced Directives 02/16/2019 02/09/2019 01/25/2019 01/24/2019 01/24/2019 01/25/2018 01/22/2017  Does Patient Have a Medical Advance Directive? Yes Yes Yes Yes Yes Yes Yes  Type of Advance Directive Living will;Healthcare Power of [place];Living will [place];Living will [place];Living will  Does patient want to make changes to medical advance directive? - No - Patient declined No - Patient declined - - - -  Copy of [place] in Chart? No - copy requested No - copy requested No - copy requested - - No - copy requested No - copy requested    Tobacco Social History   Tobacco Use  Smoking Status Former Smoker  . Types: Cigarettes  . Quit date: 04/07/1984  . Years since quitting: 34.8  Smokeless Tobacco Never Used     Counseling given: Not Answered   Clinical Intake:  Pre-visit preparation completed: Yes  Pain : No/denies pain     Nutritional Status: BMI of 19-24  Normal Nutritional Risks: None Diabetes: Yes CBG done?: No Did pt. bring in CBG monitor from home?: No  How often do you need to have  someone help you when you read instructions, pamphlets, or other written materials from your doctor or pharmacy?: 1 - Never  Interpreter Needed?: No  Information entered by ::  ,LPN  Past Medical History:  Diagnosis Date  . BPH (benign prostatic hypertrophy)   . CKD (chronic kidney disease) stage 3, GFR 30-59 ml/min   . Diabetes ([place])   . Diabetic peripheral neuropathy ([place])   . HBP (high blood pressure)   . Heart trouble   . Hyperlipidemia   . Hypertensive kidney disease   . MI (myocardial infarction) ([place]) [place]   Past Surgical History:  Procedure Laterality Date  . ANGIOPLASTY     x 2  . CARDIAC SURGERY    . CATARACT EXTRACTION, BILATERAL  06/2015  . CORONARY ARTERY BYPASS GRAFT  1991  . HERNIA REPAIR     Family History  Problem Relation Age of Onset  . Diabetes Mother   . Heart disease Mother   . Hypertension Mother   . Emphysema Father   . Lung cancer Father   . Breast cancer Sister   . Heart disease Maternal Aunt   . Stroke Neg Hx    Social History   Socioeconomic History  . Marital status: Married    Spouse name: Not on file  . Number of children: Not on file  . Years of education: Not on file  . Highest education level: 12th grade  Occupational History  .  Occupation: Data processing manager    Comment: fulltime   Social Needs  . Financial resource strain: Not hard at all  . Food insecurity    Worry: Never true    Inability: Never true  . Transportation needs    Medical: No    Non-medical: No  Tobacco Use  . Smoking status: Former Smoker    Types: Cigarettes    Quit date: 04/07/1984    Years since quitting: 34.8  . Smokeless tobacco: Never Used  Substance and Sexual Activity  . Alcohol use: No  . Drug use: No  . Sexual activity: Yes  Lifestyle  . Physical activity    Days per week: 0 days    Minutes per session: 0 min  . Stress: Not at all  Relationships  . Social connections    Talks on phone: More than three times a week    Gets  together: More than three times a week    Attends religious service: More than 4 times per year    Active member of club or organization: No    Attends meetings of clubs or organizations: Never    Relationship status: Married  Other Topics Concern  . Not on file  Social History Narrative  . Not on file    Outpatient Encounter Medications as of 02/16/2019  Medication Sig  . aspirin EC 325 MG tablet Take 325 mg by mouth daily.  [name] atenolol (TENORMIN) 50 MG tablet Take 50 mg by mouth daily.   [name] atorvastatin (LIPITOR) 80 MG tablet Take 80 mg by mouth at bedtime.   . benzonatate (TESSALON) 200 MG capsule Take 1 capsule (200 mg total) by mouth 2 (two) times daily as needed for cough.  [name] glipiZIDE (GLUCOTROL XL) 10 MG 24 hr tablet TAKE 1 TABLET (10 MG TOTAL) BY MOUTH DAILY WITH BREAKFAST.  [name] guaiFENesin (DIABETIC TUSSIN EX) 100 MG/5ML liquid Take 10 mLs (200 mg total) by mouth 3 (three) times daily as needed for cough.  . metFORMIN (GLUCOPHAGE) 500 MG tablet Take 1 tablet (500 mg total) by mouth 2 (two) times daily with a meal. (Patient taking differently: Take 500 mg by mouth 2 (two) times daily with a meal. Pt states he takes  @ 1200,  @ 1800)  . naproxen sodium (ALEVE) 220 MG tablet Take 220 mg by mouth 2 (two) times daily as needed (pain).  . nortriptyline (PAMELOR) 25 MG capsule TAKE 1 CAPSULE (25 MG TOTAL) BY MOUTH 2 (TWO) TIMES DAILY.  [name] Omega-3 Fatty Acids (FISH OIL PO) Take 1 capsule by mouth daily.   . ramipril (ALTACE) 5 MG capsule Take 5 mg by mouth daily. Every morning  . albuterol (VENTOLIN HFA) 108 (90 Base) MCG/ACT inhaler Inhale 2 puffs into the lungs every 6 (six) hours as needed for wheezing or shortness of breath. (Patient not taking: Reported on 02/16/2019)   No facility-administered encounter medications on file as of 02/16/2019.     Activities of Daily Living In your present state of health, do you have any difficulty performing the following activities:  02/16/2019 01/24/2019  Hearing? N N  Comment no hearing aids -  Vision? N N  Comment eyeglasses, Dr.Woodard -  Difficulty concentrating or making decisions? N N  Walking or climbing stairs? N N  Comment SOB currently due to pneumonia -  Dressing or bathing? Y N  Comment currently -  Doing errands, shopping? N N  Preparing Food and eating ? N -  Using the Toilet? N -  In the past six months, have you accidently leaked urine? N -  Do you have problems with loss of bowel control? N -  Managing your Medications? N -  Managing your Finances? N -  Housekeeping or managing your Housekeeping? N -  Some recent data might be hidden    Patient Care Team: Marjie Skiff, NP as PCP - General (Nurse Practitioner) Raynelle Jan, MD as Referring Physician (Cardiology) Minor, Theadora Rama, RN as [facility] Care Management   Assessment:   This is a routine wellness examination for [place].  Exercise Activities and Dietary recommendations Current Exercise Habits: The patient does not participate in regular exercise at present, Exercise limited by: None identified  Goals    . He is still so weak from having covid (pt-stated)     Current Barriers:  [name] Knowledge Deficits related to obtaining home health services  . Recent hospitalization with covid-19 and pneumonia complaining of continued weakness   Nurse Case Manager Clinical Goal(s):  [name] Over the next 30 days, patient will work with [facility] to address needs related to continued weakness and cough  Interventions:  . Provided education to patient re: the process of obtaining Home Health . Collaborated with PCP regarding patient's continued weakness and cough  . Discussed plans with patient for ongoing care management follow up and provided patient with direct contact information for care management team . Reviewed scheduled/upcoming provider appointments including: This afternoon virtual visit with PCP. [name] Initial call with patient's  spouse Gigi Gin she reports patient is continuing to be weak and tired. She also reports patient still has a significant cough and is needing 02 intermittently when SOB and cough increases. Spouse reports patient blood sugars within normal limits at this time. Agreeable to home health for monitoring and to work with him to increase strength.  . PCP placed Home Health Referral  . Message sent to referral coordinator   Patient Self Care Activities:  . Attends all scheduled provider appointments . Continued weakness from recent covid-19 and pneumonia Initial goal documentation     . Increase water intake     Recommend drinking at least 6-8 glasses of water a day       Fall Risk: Fall Risk  02/16/2019 07/16/2018 01/25/2018 01/22/2017 02/25/2016  Falls in the past year? 1 0 No No No  Number falls in past yr: 0 - - - -  Injury with Fall? 0 - - - -  Risk for fall due to : Impaired balance/gait - - - -  Follow up - Falls evaluation completed - - -    FALL RISK PREVENTION PERTAINING TO THE HOME:  Any stairs in or around the home? No  If so, are there any without handrails? No   Home free of loose throw rugs in walkways, pet beds, electrical cords, etc? Yes  Adequate lighting in your home to reduce risk of falls? Yes   ASSISTIVE DEVICES UTILIZED TO PREVENT FALLS:  Life alert? No  Use of a cane, walker or w/c? Yes  cane, walker  Grab bars in the bathroom? Yes  Shower chair or bench in shower? yes Elevated toilet seat or a handicapped toilet? No   TIMED UP AND GO:  Unable to perform   Depression Screen PHQ 2/9 Scores 02/16/2019 01/25/2018 01/22/2017 02/25/2016  PHQ - 2 Score 1 0 0 0    Cognitive Function     6CIT Screen 01/25/2018 01/22/2017  What Year? 0 points 0 points  What month?  0 points 0 points  What time? 0 points 0 points  Count back from 20 0 points 0 points  Months in reverse 0 points 0 points  Repeat phrase 0 points 0 points  Total Score 0 0    Immunization  History  Administered Date(s) Administered  . Fluad Quad(high Dose 65+) 01/29/2019  . Influenza, High Dose Seasonal PF 12/25/2015, 01/22/2017, 01/13/2018  . Influenza,inj,Quad PF,6+ Mos 03/16/2015  . Pneumococcal Conjugate-13 09/27/2013  . Pneumococcal Polysaccharide-23 12/21/2006, 12/22/2011  . Zoster 12/21/2006    Qualifies for Shingles Vaccine? Yes  Zostavax completed 2008. Due for Shingrix. Education has been provided regarding the importance of this vaccine. Pt has been advised to call insurance company to determine out of pocket expense. Advised may also receive vaccine at local pharmacy or Health Dept. Verbalized acceptance and understanding.  Tdap: Discussed need for TD/TDAP vaccine, patient verbalized understanding that this is not covered as a preventative with there insurance and to call the office if he develops any new skin injuries, ie: cuts, scrapes, bug bites, or open wounds.  Flu Vaccine: up to date   Pneumococcal Vaccine: up to date, eligible for booster once he is feeling better  Screening Tests Health Maintenance  Topic Date Due  . OPHTHALMOLOGY EXAM  08/26/2018  . TETANUS/TDAP  02/16/2020 (Originally 07/01/1964)  . HEMOGLOBIN A1C  07/26/2019  . FOOT EXAM  10/27/2019  . INFLUENZA VACCINE  Completed  . Hepatitis C Screening  Completed  . PNA vac Low Risk Adult  Completed  . COLONOSCOPY  Discontinued   Cancer Screenings:  Colorectal Screening: Completed 2015. No longer required   Lung Cancer Screening: (Low Dose CT Chest recommended if Age 21-80 years, 30 pack-year currently smoking OR have quit w/in 15years.) does not qualify.     Additional Screening:  Hepatitis C Screening: does qualify; Completed 2019  Vision Screening: Recommended annual ophthalmology exams for early detection of glaucoma and other disorders of the eye. Is the patient up to date with their annual eye exam?  Yes  Who is the provider or what is the name of the office in which the pt  attends annual eye exams? Dr.Woodard   Dental Screening: Recommended annual dental exams for proper oral hygiene  Community Resource Referral:   CRR required this visit?  No        Plan:  I have personally reviewed and addressed the Medicare Annual Wellness questionnaire and have noted the following in the patient's chart:  A. Medical and social history B. Use of alcohol, tobacco or illicit drugs  C. Current medications and supplements D. Functional ability and status E.  Nutritional status F.  Physical activity G. Advance directives H. List of other physicians I.  Hospitalizations, surgeries, and ER visits in previous 12 months J.  Vitals K. Screenings such as hearing and vision if needed, cognitive and depression L. Referrals and appointments   In addition, I have reviewed and discussed with patient certain preventive protocols, quality metrics, and best practice recommendations. A written personalized care plan for preventive services as well as general preventive health recommendations were provided to patient.   Signed,   Collene Schlichter, LPN  16/01/9603 Nurse Health Advisor   Nurse Notes: patient complained of increase SOB today. He had virtual visit with Jolene Cannady,NP yesterday and stated he was okay then but today seems worse. States he did take a shower this morning and that got him very out of breathe. He also mentioned not using his oxygen when  walking outside. Inquired if he was using in inhaler, he stated he hasnt yet he wasn't sure when or how to use it. We went over inhaler information and he verbalized understanding on inhaler use. Inquired what o2 stats were today, he stats in 94% range while sitting with 2 liters or oxygen on. Advised patient to use his oxygen when walking, especially if his oygen was below 90% while walking. He states sometimes it drops to low 80's while walking without o2. Will discuss with Margit Hanksachel Lane,PA to see if patient needs nebulizer at  home or increase in o2.  Discussed with Margit Hanksachel Lane,PA who gave verbal orders to increase to 3/l of o2 when needed to keep his oxygen levels above 89%, to make sure he uses o2 when walking. Use inhaler as needed, no nebulizer for now. Return to hospital if o2 continue to drop below 89% with o2.   Called patient back and informed him, he verbalized all understanding and will follow up in one week with Jolene Cannady,NP as discussed unless he needs anything interm.

## 2019-02-18 DIAGNOSIS — E1122 Type 2 diabetes mellitus with diabetic chronic kidney disease: Secondary | ICD-10-CM | POA: Diagnosis not present

## 2019-02-18 DIAGNOSIS — N189 Chronic kidney disease, unspecified: Secondary | ICD-10-CM | POA: Diagnosis not present

## 2019-02-18 DIAGNOSIS — J1289 Other viral pneumonia: Secondary | ICD-10-CM | POA: Diagnosis not present

## 2019-02-18 DIAGNOSIS — Z7984 Long term (current) use of oral hypoglycemic drugs: Secondary | ICD-10-CM | POA: Diagnosis not present

## 2019-02-18 DIAGNOSIS — Z9981 Dependence on supplemental oxygen: Secondary | ICD-10-CM | POA: Diagnosis not present

## 2019-02-18 DIAGNOSIS — J189 Pneumonia, unspecified organism: Secondary | ICD-10-CM | POA: Diagnosis not present

## 2019-02-18 DIAGNOSIS — U071 COVID-19: Secondary | ICD-10-CM | POA: Diagnosis not present

## 2019-02-18 DIAGNOSIS — Z7982 Long term (current) use of aspirin: Secondary | ICD-10-CM | POA: Diagnosis not present

## 2019-02-18 DIAGNOSIS — I129 Hypertensive chronic kidney disease with stage 1 through stage 4 chronic kidney disease, or unspecified chronic kidney disease: Secondary | ICD-10-CM | POA: Diagnosis not present

## 2019-02-21 DIAGNOSIS — Z7982 Long term (current) use of aspirin: Secondary | ICD-10-CM | POA: Diagnosis not present

## 2019-02-21 DIAGNOSIS — Z7984 Long term (current) use of oral hypoglycemic drugs: Secondary | ICD-10-CM | POA: Diagnosis not present

## 2019-02-21 DIAGNOSIS — J1289 Other viral pneumonia: Secondary | ICD-10-CM | POA: Diagnosis not present

## 2019-02-21 DIAGNOSIS — N189 Chronic kidney disease, unspecified: Secondary | ICD-10-CM | POA: Diagnosis not present

## 2019-02-21 DIAGNOSIS — Z9981 Dependence on supplemental oxygen: Secondary | ICD-10-CM | POA: Diagnosis not present

## 2019-02-21 DIAGNOSIS — J189 Pneumonia, unspecified organism: Secondary | ICD-10-CM | POA: Diagnosis not present

## 2019-02-21 DIAGNOSIS — I129 Hypertensive chronic kidney disease with stage 1 through stage 4 chronic kidney disease, or unspecified chronic kidney disease: Secondary | ICD-10-CM | POA: Diagnosis not present

## 2019-02-21 DIAGNOSIS — U071 COVID-19: Secondary | ICD-10-CM | POA: Diagnosis not present

## 2019-02-21 DIAGNOSIS — E1122 Type 2 diabetes mellitus with diabetic chronic kidney disease: Secondary | ICD-10-CM | POA: Diagnosis not present

## 2019-02-22 ENCOUNTER — Ambulatory Visit: Payer: Self-pay | Admitting: *Deleted

## 2019-02-22 ENCOUNTER — Telehealth: Payer: Self-pay

## 2019-02-22 DIAGNOSIS — N189 Chronic kidney disease, unspecified: Secondary | ICD-10-CM | POA: Diagnosis not present

## 2019-02-22 DIAGNOSIS — Z7984 Long term (current) use of oral hypoglycemic drugs: Secondary | ICD-10-CM | POA: Diagnosis not present

## 2019-02-22 DIAGNOSIS — E1122 Type 2 diabetes mellitus with diabetic chronic kidney disease: Secondary | ICD-10-CM | POA: Diagnosis not present

## 2019-02-22 DIAGNOSIS — J1289 Other viral pneumonia: Secondary | ICD-10-CM | POA: Diagnosis not present

## 2019-02-22 DIAGNOSIS — Z9981 Dependence on supplemental oxygen: Secondary | ICD-10-CM | POA: Diagnosis not present

## 2019-02-22 DIAGNOSIS — Z7982 Long term (current) use of aspirin: Secondary | ICD-10-CM | POA: Diagnosis not present

## 2019-02-22 DIAGNOSIS — J189 Pneumonia, unspecified organism: Secondary | ICD-10-CM | POA: Diagnosis not present

## 2019-02-22 DIAGNOSIS — I129 Hypertensive chronic kidney disease with stage 1 through stage 4 chronic kidney disease, or unspecified chronic kidney disease: Secondary | ICD-10-CM | POA: Diagnosis not present

## 2019-02-22 DIAGNOSIS — U071 COVID-19: Secondary | ICD-10-CM | POA: Diagnosis not present

## 2019-02-22 NOTE — Chronic Care Management (AMB) (Signed)
  Chronic Care Management   Outreach Note  02/22/2019 Name: Matthew Wolf MRN: [id number] DOB: [birth year]  Referred by: [name], NP Reason for referral : Chronic Care Management (Unsuccessful outreach)   An unsuccessful telephone outreach was attempted today. The patient was referred to the case management team by for assistance with care management and care coordination.   Follow Up Plan: The care management team will reach out to the patient again over the next 30 days.  The patient has been provided with contact information for the care management team and has been advised to call with any health related questions or concerns.    Matthew Morse Ceonna Frazzini RN, BSN Nurse Case Editor, [facility]ing [facility] Care Management  [phone]) Business Mobile

## 2019-02-24 ENCOUNTER — Ambulatory Visit (INDEPENDENT_AMBULATORY_CARE_PROVIDER_SITE_OTHER): Payer: Medicare HMO | Admitting: Nurse Practitioner

## 2019-02-24 ENCOUNTER — Other Ambulatory Visit: Payer: Self-pay

## 2019-02-24 ENCOUNTER — Encounter: Payer: Self-pay | Admitting: Nurse Practitioner

## 2019-02-24 DIAGNOSIS — J1289 Other viral pneumonia: Secondary | ICD-10-CM

## 2019-02-24 DIAGNOSIS — U071 COVID-19: Secondary | ICD-10-CM

## 2019-02-24 NOTE — Progress Notes (Signed)
There were no vitals taken for this visit.   Subjective:    Patient ID: Matthew Wolf, male    DOB: 16-Sep-1945, 73 y.o.   MRN: [id number]  HPI: Matthew Wolf is a 73 y.o. male  Chief Complaint  Patient presents with  . Pneumonia    follow up    . This visit was completed via telephone due to the restrictions of the COVID-19 pandemic. All issues as above were discussed and addressed but no physical exam was performed. If it was felt that the patient should be evaluated in the office, they were directed there. The patient verbally consented to this visit. Patient was unable to complete an audio/visual visit due to Technical difficulties,Lack of internet. Due to the catastrophic nature of the COVID-19 pandemic, this visit was done through audio contact only. . Location of the patient: home . Location of the provider: work . Those involved with this call:  . Provider: Aura Dials, DNP . CMA: Wilhemena Durie, CMA . Front Desk/Registration: Harriet Pho  . Time spent on call: 15 minutes on the phone discussing health concerns. 10 minutes total spent in review of patient's record and preparation of their chart.  . I verified patient identity using two factors (patient name and date of birth). Patient consents verbally to being seen via telemedicine visit today.    COVID 19 FOLLOW-UP: Covid positive testing initially on 01/14/2019.  He was admitted to [facility] on 01/24/2019 after worsening symptoms presented and discharged on 01/29/2019. He reports his SOB is improving,  using O2 plus inhaler as needed.   Reports he is feeling better each day, only time he feels bad when he has the nagging cough which is improving with Tessalon.  Feeling about 75% better.  On recent return to ER for visit CXR did note "Significant interval increase in the widespread areas of interstitial airspace opacity throughout the lungs, compatible with a multifocal pneumonia", ongoing changes with Covid.  He  did not want to return to [facility] and opted to go home.  Currently has physical and occupational therapy coming to home + is going for short walks + doing incentive spirometer at home every couple hours.    Has not had any further falls, had fall weeks ago.  Eating well at home.  Prior to Covid Mr. Yurko was very active and independent.  Has underlying T2DM with A1C in July 7.5%, recent hospital A1C in October 7.9%. Was discharged with Prednisone from [facility] which he has now completed. Fever: no Cough: yes Shortness of breath: intermittent Wheezing: no Chest pain: no Chest tightness: no Chest congestion: no Nasal congestion: no Runny nose: no Post nasal drip: no Sneezing: no Sore throat: no Swollen glands: no Sinus pressure: no Headache: no Face pain: no Toothache: no Ear pain: none Ear pressure: none Eyes red/itching:no Eye drainage/crusting: no  Vomiting: no Rash: no Fatigue: yes Sick contacts: no Strep contacts: no  Context: stable   Relevant past medical, surgical, family and social history reviewed and updated as indicated. Interim medical history since our last visit reviewed. Allergies and medications reviewed and updated.  Review of Systems  Constitutional: Positive for fatigue. Negative for activity change, diaphoresis and fever.  Respiratory: Positive for cough and shortness of breath. Negative for chest tightness and wheezing.   Cardiovascular: Negative for chest pain, palpitations and leg swelling.  Gastrointestinal: Negative for abdominal distention, abdominal pain, constipation, diarrhea, nausea and vomiting.  Endocrine: Negative for cold intolerance, heat intolerance,  polydipsia, polyphagia and polyuria.  Neurological: Negative for dizziness, syncope, weakness, light-headedness, numbness and headaches.  Psychiatric/Behavioral: Negative.     Per HPI unless specifically indicated above     Objective:    There were no vitals taken for this  visit.  Wt Readings from Last 3 Encounters:  02/16/19 130 lb (59 kg)  02/15/19 130 lb (59 kg)  02/09/19 133 lb (60.3 kg)    Physical Exam   Unable to perform due to telephone visit only.  Results for orders placed or performed during the hospital encounter of 40/98/11  Basic metabolic panel  Result Value Ref Range   Sodium 129 (L) 135 - 145 mmol/L   Potassium 5.7 (H) 3.5 - 5.1 mmol/L   Chloride 94 (L) 98 - 111 mmol/L   CO2 23 22 - 32 mmol/L   Glucose, Bld 438 (H) 70 - 99 mg/dL   BUN 22 8 - 23 mg/dL   Creatinine, Ser 0.94 0.61 - 1.24 mg/dL   Calcium 8.5 (L) 8.9 - 10.3 mg/dL   GFR calc non Af Amer >60 >60 mL/min   GFR calc Af Amer >60 >60 mL/min   Anion gap 12 5 - 15  CBC  Result Value Ref Range   WBC 16.7 (H) 4.0 - 10.5 K/uL   RBC 3.91 (L) 4.22 - 5.81 MIL/uL   Hemoglobin 12.7 (L) 13.0 - 17.0 g/dL   HCT 37.3 (L) 39.0 - 52.0 %   MCV 95.4 80.0 - 100.0 fL   MCH 32.5 26.0 - 34.0 pg   MCHC 34.0 30.0 - 36.0 g/dL   RDW 12.7 11.5 - 15.5 %   Platelets 275 150 - 400 K/uL   nRBC 0.0 0.0 - 0.2 %  Troponin I (High Sensitivity)  Result Value Ref Range   Troponin I (High Sensitivity) 16 <18 ng/L      Assessment & Plan:   Problem List Items Addressed This Visit      Respiratory   Pneumonia due to COVID-19 virus    Ongoing with improvement reported at visit today.  Plan to continue weekly virtual follow-up visits until symptoms 100% improved.  Will repeat CXR in 6 weeks to assess PNA (due 03/23/19).  Continue Diabetic Tussin, Tessalon, and Albuterol inhaler as needed.  Recommend continued frequent use of O2 at home.  Continue  PT/OT to assist with strengthening and endurance.  Return in one week.         I discussed the assessment and treatment plan with the patient. The patient was provided an opportunity to ask questions and all were answered. The patient agreed with the plan and demonstrated an understanding of the instructions.   The patient was advised to call back or seek an  in-person evaluation if the symptoms worsen or if the condition fails to improve as anticipated.   I provided 15 minutes of time during this encounter.  Follow up plan: Return in about 1 week (around 03/03/2019) for Covid follow-up (weekly visit).

## 2019-02-24 NOTE — Patient Instructions (Signed)

## 2019-02-24 NOTE — Assessment & Plan Note (Signed)
Ongoing with improvement reported at visit today.  Plan to continue weekly virtual follow-up visits until symptoms 100% improved.  Will repeat CXR in 6 weeks to assess PNA (due 03/23/19).  Continue Diabetic Tussin, Tessalon, and Albuterol inhaler as needed.  Recommend continued frequent use of O2 at home.  Continue  PT/OT to assist with strengthening and endurance.  Return in one week.

## 2019-02-28 ENCOUNTER — Ambulatory Visit: Payer: Self-pay | Admitting: Nurse Practitioner

## 2019-02-28 NOTE — Telephone Encounter (Signed)
Noted, thank you

## 2019-02-28 NOTE — Telephone Encounter (Signed)
Please make sure he is on my schedule for this Tuesday or Wednesday and advise him if worsening symptoms today I want him to immediately go into ER.

## 2019-02-28 NOTE — Telephone Encounter (Signed)
Pt had virtual visit with J. Cannady 02/24/2019. Pt is to have weekly virtual visits per note. Pt calling today "To see what all I can do to get over this."  States "She is supposed to call me this week."  States no worsening of symptoms since visit. Reports O2 sats drop to 85-86% on RA with minimal exertion. On O2 at 2.5 liters, Sats on O2 93-95%. Denies fever, no wheezing, denies chest pain, tightness. States is using inhaler and tessalon, cough "Not much better." [place] called practice, Laural Roes; will send encounter to practice for Ms. Cannady's review. Care advise given, advised ED for ANY worsening of symptoms. Pt verbalizes understanding.  Reason for Disposition . [1] MILD longstanding difficulty breathing AND [2]  SAME as normal  Answer Assessment - Initial Assessment Questions 1. RESPIRATORY STATUS: "Describe your breathing?" (e.g., wheezing, shortness of breath, unable to speak, severe coughing)      O2 sat drops to 85-86 on RA  Please see summary. 2. ONSET: "When did this breathing problem begin?"     3 weeks ago 3. PATTERN "Does the difficult breathing come and go, or has it been constant since it started?"      *No Answer* 4. SEVERITY: "How bad is your breathing?" (e.g., mild, moderate, severe)    - MILD: No SOB at rest, mild SOB with walking, speaks normally in sentences, can lay down, no retractions, pulse < 100.    - MODERATE: SOB at rest, SOB with minimal exertion and prefers to sit, cannot lie down flat, speaks in phrases, mild retractions, audible wheezing, pulse 100-120.    - SEVERE: Very SOB at rest, speaks in single words, struggling to breathe, sitting hunched forward, retractions, pulse > 120      *No Answer* 5. RECURRENT SYMPTOM: "Have you had difficulty breathing before?" If so, ask: "When was the last time?" and "What happened that time?"     Please see encounter/visit 11/19. 6. CARDIAC HISTORY: "Do you have any history of heart disease?" (e.g., heart attack, angina, bypass  surgery, angioplasty)      *No Answer* 7. LUNG HISTORY: "Do you have any history of lung disease?"  (e.g., pulmonary embolus, asthma, emphysema)     *No Answer* 8. CAUSE: "What do you think is causing the breathing problem?"      *No Answer* 9. OTHER SYMPTOMS: "Do you have any other symptoms? (e.g., dizziness, runny nose, cough, chest pain, fever)    Cough  Protocols used: BREATHING DIFFICULTY-A-AH

## 2019-02-28 NOTE — Telephone Encounter (Signed)
Called pt scheduled him a virtual visit for tomorrow and let him know that if he gets worse to go to ER. Pt verbalized understanding.

## 2019-03-01 ENCOUNTER — Other Ambulatory Visit: Payer: Self-pay

## 2019-03-01 ENCOUNTER — Encounter: Payer: Self-pay | Admitting: Nurse Practitioner

## 2019-03-01 ENCOUNTER — Ambulatory Visit (INDEPENDENT_AMBULATORY_CARE_PROVIDER_SITE_OTHER): Payer: Medicare HMO | Admitting: Nurse Practitioner

## 2019-03-01 DIAGNOSIS — U071 COVID-19: Secondary | ICD-10-CM

## 2019-03-01 DIAGNOSIS — J1289 Other viral pneumonia: Secondary | ICD-10-CM | POA: Diagnosis not present

## 2019-03-01 DIAGNOSIS — J1282 Pneumonia due to coronavirus disease 2019: Secondary | ICD-10-CM

## 2019-03-01 NOTE — Progress Notes (Signed)
There were no vitals taken for this visit.   Subjective:    Patient ID: Matthew Wolf, male    DOB: 04/04/1946, 73 y.o.   MRN: [id number]  HPI: Matthew Wolf is a 73 y.o. male  Chief Complaint  Patient presents with  . Pneumonia    1 week f/up    . This visit was completed via Doximity due to the restrictions of the COVID-19 pandemic. All issues as above were discussed and addressed. Physical exam was done as above through visual confirmation on Doximity. If it was felt that the patient should be evaluated in the office, they were directed there. The patient verbally consented to this visit. . Location of the patient: home . Location of the provider: work . Those involved with this call:  . Provider: Aura Dials, DNP . CMA: Wilhemena Durie, CMA . Front Desk/Registration: Harriet Pho  . Time spent on call: 15 minutes with patient face to face via video conference. More than 50% of this time was spent in counseling and coordination of care. 10 minutes total spent in review of patient's record and preparation of their chart.  . I verified patient identity using two factors (patient name and date of birth). Patient consents verbally to being seen via telemedicine visit today.    COVID 19 FOLLOW-UP: Covid positive testing initially on 01/14/2019.  He was admitted to [facility] on 01/24/2019 after worsening symptoms presented and discharged on 01/29/2019. He reports his SOB is improving,  using O2 plus inhaler as needed -- has been using only one time a day.   Reports he is feeling better each day, only time he feels bad when he has the nagging cough which is improving with Tessalon and Tussin.  Reports nonproductive cough.  Using incentive spirometry 4-5 times a day.  Feeling about 75% better.  On recent return to ER for visit CXR did note "Significant interval increase in the widespread areas of interstitial airspace opacity throughout the lungs, compatible with a multifocal  pneumonia", ongoing changes with Covid.  He did not want to return to [facility] and opted to go home.  Currently has physical and occupational therapy coming to home, have not come this week as of yet.  Last visit was the beginning of last week.  States he feels okay until he goes outside for walk without O2 and then comes back in, sats will go under 90% (about 85-88%)   Eating well at home.  Prior to Covid Matthew Wolf was very active and independent.  Has underlying T2DM with A1C in July 7.5%, recent hospital A1C in October 7.9%. Was discharged with Prednisone from [facility] which he has now completed. Fever: no Cough: yes Shortness of breath: intermittent Wheezing: no Chest pain: no Chest tightness: no Chest congestion: no Nasal congestion: no Runny nose: no Post nasal drip: no Sneezing: no Sore throat: no Swollen glands: no Sinus pressure: no Headache: no Face pain: no Toothache: no Ear pain: none Ear pressure: none Eyes red/itching:no Eye drainage/crusting: no  Vomiting: no Rash: no Fatigue: yes Sick contacts: no Strep contacts: no  Context: stable   Relevant past medical, surgical, family and social history reviewed and updated as indicated. Interim medical history since our last visit reviewed. Allergies and medications reviewed and updated.  Review of Systems  Constitutional: Positive for fatigue. Negative for activity change, diaphoresis and fever.  Respiratory: Positive for cough and shortness of breath. Negative for chest tightness and wheezing.   Cardiovascular:  Negative for chest pain, palpitations and leg swelling.  Gastrointestinal: Negative for abdominal distention, abdominal pain, constipation, diarrhea, nausea and vomiting.  Endocrine: Negative for cold intolerance, heat intolerance, polydipsia, polyphagia and polyuria.  Neurological: Negative for dizziness, syncope, weakness, light-headedness, numbness and headaches.  Psychiatric/Behavioral: Negative.      Per HPI unless specifically indicated above     Objective:    There were no vitals taken for this visit.  Wt Readings from Last 3 Encounters:  02/16/19 130 lb (59 kg)  02/15/19 130 lb (59 kg)  02/09/19 133 lb (60.3 kg)    Physical Exam Vitals signs and nursing note reviewed.  Constitutional:      General: He is awake. He is not in acute distress.    Appearance: He is well-developed. He is not ill-appearing.  HENT:     Head: Normocephalic.     Right Ear: Hearing normal. No drainage.     Left Ear: Hearing normal. No drainage.  Eyes:     General: Lids are normal.        Right eye: No discharge.        Left eye: No discharge.     Conjunctiva/sclera: Conjunctivae normal.  Neck:     Musculoskeletal: Normal range of motion.  Pulmonary:     Effort: Pulmonary effort is normal. No accessory muscle usage or respiratory distress.     Comments: Unable to auscultate due to virtual exam only.  No SOB with talking and no cough noted during exam period.  O2 via nasal cannula in place. Neurological:     Mental Status: He is alert and oriented to person, place, and time.  Psychiatric:        Mood and Affect: Mood normal.        Behavior: Behavior normal. Behavior is cooperative.        Thought Content: Thought content normal.        Judgment: Judgment normal.      Results for orders placed or performed during the hospital encounter of 02/09/19  Basic metabolic panel  Result Value Ref Range   Sodium 129 (L) 135 - 145 mmol/L   Potassium 5.7 (H) 3.5 - 5.1 mmol/L   Chloride 94 (L) 98 - 111 mmol/L   CO2 23 22 - 32 mmol/L   Glucose, Bld 438 (H) 70 - 99 mg/dL   BUN 22 8 - 23 mg/dL   Creatinine, Ser 1.610.94 0.61 - 1.24 mg/dL   Calcium 8.5 (L) 8.9 - 10.3 mg/dL   GFR calc non Af Amer >60 >60 mL/min   GFR calc Af Amer >60 >60 mL/min   Anion gap 12 5 - 15  CBC  Result Value Ref Range   WBC 16.7 (H) 4.0 - 10.5 K/uL   RBC 3.91 (L) 4.22 - 5.81 MIL/uL   Hemoglobin 12.7 (L) 13.0 - 17.0 g/dL    HCT 09.637.3 (L) 04.539.0 - 52.0 %   MCV 95.4 80.0 - 100.0 fL   MCH 32.5 26.0 - 34.0 pg   MCHC 34.0 30.0 - 36.0 g/dL   RDW 40.912.7 81.111.5 - 91.415.5 %   Platelets 275 150 - 400 K/uL   nRBC 0.0 0.0 - 0.2 %  Troponin I (High Sensitivity)  Result Value Ref Range   Troponin I (High Sensitivity) 16 <18 ng/L      Assessment & Plan:   Problem List Items Addressed This Visit      Respiratory   Pneumonia due to COVID-19 virus    Ongoing  with improvement reported at visit today.  Plan to continue weekly virtual follow-up visits until symptoms 100% improved.  Will repeat CXR in 6 weeks to assess PNA (due 03/23/19).  Continue Diabetic Tussin, Tessalon, and Albuterol inhaler as needed (have recommended he utilize this more than once a day at this time).  Recommend continued frequent use of O2 at home and recommend using this when out for walks too to avoid low sats.  Continue  PT/OT to assist with strengthening and endurance.  Return in one week.  Will see if patient is candidate for outpatient infusion treatments once available.         I discussed the assessment and treatment plan with the patient. The patient was provided an opportunity to ask questions and all were answered. The patient agreed with the plan and demonstrated an understanding of the instructions.   The patient was advised to call back or seek an in-person evaluation if the symptoms worsen or if the condition fails to improve as anticipated.   I provided 15 minutes of time during this encounter.  Follow up plan: Return in about 1 week (around 03/08/2019) for Covid follow-up.

## 2019-03-01 NOTE — Assessment & Plan Note (Signed)
Ongoing with improvement reported at visit today.  Plan to continue weekly virtual follow-up visits until symptoms 100% improved.  Will repeat CXR in 6 weeks to assess PNA (due 03/23/19).  Continue Diabetic Tussin, Tessalon, and Albuterol inhaler as needed (have recommended he utilize this more than once a day at this time).  Recommend continued frequent use of O2 at home and recommend using this when out for walks too to avoid low sats.  Continue  PT/OT to assist with strengthening and endurance.  Return in one week.  Will see if patient is candidate for outpatient infusion treatments once available.

## 2019-03-01 NOTE — Patient Instructions (Signed)

## 2019-03-02 ENCOUNTER — Telehealth: Payer: Self-pay

## 2019-03-02 NOTE — Telephone Encounter (Signed)
Confirmed appointment with patient. klh °

## 2019-03-04 DIAGNOSIS — J1289 Other viral pneumonia: Secondary | ICD-10-CM | POA: Diagnosis not present

## 2019-03-04 DIAGNOSIS — Z9981 Dependence on supplemental oxygen: Secondary | ICD-10-CM | POA: Diagnosis not present

## 2019-03-04 DIAGNOSIS — E1122 Type 2 diabetes mellitus with diabetic chronic kidney disease: Secondary | ICD-10-CM | POA: Diagnosis not present

## 2019-03-04 DIAGNOSIS — Z7982 Long term (current) use of aspirin: Secondary | ICD-10-CM | POA: Diagnosis not present

## 2019-03-04 DIAGNOSIS — J189 Pneumonia, unspecified organism: Secondary | ICD-10-CM | POA: Diagnosis not present

## 2019-03-04 DIAGNOSIS — Z7984 Long term (current) use of oral hypoglycemic drugs: Secondary | ICD-10-CM | POA: Diagnosis not present

## 2019-03-04 DIAGNOSIS — N189 Chronic kidney disease, unspecified: Secondary | ICD-10-CM | POA: Diagnosis not present

## 2019-03-04 DIAGNOSIS — I129 Hypertensive chronic kidney disease with stage 1 through stage 4 chronic kidney disease, or unspecified chronic kidney disease: Secondary | ICD-10-CM | POA: Diagnosis not present

## 2019-03-04 DIAGNOSIS — U071 COVID-19: Secondary | ICD-10-CM | POA: Diagnosis not present

## 2019-03-07 ENCOUNTER — Other Ambulatory Visit: Payer: Self-pay

## 2019-03-07 ENCOUNTER — Ambulatory Visit: Payer: Medicare HMO | Admitting: Internal Medicine

## 2019-03-07 ENCOUNTER — Encounter: Payer: Self-pay | Admitting: Internal Medicine

## 2019-03-07 VITALS — Resp 16 | Ht 67.0 in | Wt 130.0 lb

## 2019-03-07 DIAGNOSIS — J1282 Pneumonia due to coronavirus disease 2019: Secondary | ICD-10-CM

## 2019-03-07 DIAGNOSIS — J849 Interstitial pulmonary disease, unspecified: Secondary | ICD-10-CM

## 2019-03-07 DIAGNOSIS — R0602 Shortness of breath: Secondary | ICD-10-CM | POA: Diagnosis not present

## 2019-03-07 DIAGNOSIS — J1289 Other viral pneumonia: Secondary | ICD-10-CM

## 2019-03-07 DIAGNOSIS — R0902 Hypoxemia: Secondary | ICD-10-CM | POA: Diagnosis not present

## 2019-03-07 DIAGNOSIS — U071 COVID-19: Secondary | ICD-10-CM

## 2019-03-07 MED ORDER — BUDESONIDE-FORMOTEROL FUMARATE 160-4.5 MCG/ACT IN AERO: 2.0000 | INHALATION_SPRAY | Freq: Two times a day (BID) | RESPIRATORY_TRACT | 3 refills | Status: DC

## 2019-03-07 NOTE — Patient Instructions (Signed)
Preventing Pulmonary Fibrosis Pulmonary fibrosis is scarring, stiffening, and thickening (fibrosis) that develops over time in lung tissue (pulmonary tissue). At first, this may cause shortness of breath and a dry cough. Over time, symptoms often become more serious and complications may occur. There are many different causes of pulmonary fibrosis. Sometimes the cause is not known. You may not be able to entirely prevent pulmonary fibrosis, but you may be able to reduce your risk by:  Avoiding pollutants.  Protecting your lungs.  Making lifestyle changes.  Managing conditions that may lead to pulmonary fibrosis. How can pulmonary fibrosis affect me? Pulmonary fibrosis is a serious condition. Once you have this condition, it tends to get worse (progress) gradually over several years and may lead to:  Collapsed lung.  Infection of your lungs (pneumonia).  Blood clots in the lungs.  Lung cancer.  Inability of your heart to pump blood (heart failure).  A condition in which the lungs cannot take in enough oxygen or remove excess carbon dioxide (respiratory failure). What actions can I take to prevent this condition?     Avoid pollutants Protect yourself from breathing harmful dusts. Harmful dusts include grain, wood, soil, coal, silica, and asbestos. To protect yourself from these dusts:  Know which types of dust you may be exposed to.  Avoid working in dusty areas when possible.  Work in an area with good air circulation (ventilation).  Wet down work areas to reduce dust when possible.  Use an approved respirator mask.  Do not eat, drink, or take breaks in dusty areas.  Wash your hands and face or shower after work.  Change into clean clothes before going home.  Do not park your car near areas that may be affected by dust. Protect your lungs Take steps to protect yourself from lung infections:  Stay up to date on all vaccines. This includes the flu (influenza) and  pneumonia (pneumococcal) vaccines.  Avoid contact with people who are sick with flu or cold symptoms.  Wash your hands often.  Use a disinfectant to regularly clean surfaces at home and at work.  Get 7-8 hours of sleep every night.  Eat a healthy diet that includes plenty of fruits and vegetables, whole grains, and healthy sources of protein, such as poultry, fish, nuts, and low-fat dairy.  Exercise at least 30 minutes most days of the week. Make lifestyle changes  Do not use any products that contain nicotine or tobacco, such as cigarettes and e-cigarettes. If you need help quitting, ask your health care provider.  Avoid secondhand smoke. General information  If you have heartburn, or GERD, work with your health care provider to treat your condition. This may include taking medicines and making changes to your diet.  Ask your health care provider whether your medicines put you at risk for pulmonary fibrosis. This is especially important if you have seizures, heart disease, or cancer. Talk with your health care provider about other treatments that may work for you. Where to find more information Learn more about pulmonary fibrosis from:  American Lung Association: lung.org  Pulmonary Fibrosis Foundation: pulmonaryfibrosis.org Summary  You may be able to lower your risk for pulmonary fibrosis by avoiding pollutants, protecting your lungs, making lifestyle changes, and managing conditions that may lead to pulmonary fibrosis.  If you work or spend time around harmful dusts, take precautions to avoid inhaling dust.  Protecting your lungs from infections can help you stay healthy and may reduce your risk of pulmonary fibrosis.  Quitting   smoking can help reduce the risk of pulmonary fibrosis and other serious lung conditions. This information is not intended to replace advice given to you by your health care provider. Make sure you discuss any questions you have with your health care  provider. Document Released: 06/15/2017 Document Revised: 07/16/2018 Document Reviewed: 06/15/2017 Elsevier Patient Education  2020 Elsevier Inc.  

## 2019-03-07 NOTE — Progress Notes (Signed)
[facility] [address] [place], [place] [address]  Internal MEDICINE  Telephone Visit  Patient Name: Matthew Wolf  [id number]  [id number]  Date of Service: 03/07/2019  I connected with the patient at 1230 by telephone and verified the patients identity using two identifiers.   I discussed the limitations, risks, security and privacy concerns of performing an evaluation and management service by telephone and the availability of in person appointments. I also discussed with the patient that there may be a patient responsible charge related to the service.  The patient expressed understanding and agrees to proceed.    Chief Complaint  Patient presents with  . Shortness of Breath    new patient was in [place] for a week with covid , on home oxygen 2l ,   . Telephone Assessment  . Telephone Screen    HPI Had [phone]. Has not improved since discharge patient states that he was diagnosed back in October with COVID-19 eventually he was admitted to [facility] in [place] because of worsening of his symptoms.  The patient had increasing shortness of breath went into respiratory failure and required about a week or so of therapy.  The patient subsequently was discharged home on oxygen he had not been previously on oxygen.  He states now he is having a great deal of shortness of breath.  A chest x-ray that was done on November 4 shows a diffuse interstitial type pattern.  Patient states his saturations dropped down to about 80% when he exerts himself.  Patient also has been having some cough.  He did go back to work and he has been trying to stay at work despite him being rather short of breath.    Current Medication: Outpatient Encounter Medications as of 03/07/2019  Medication Sig Note  . albuterol (VENTOLIN HFA) 108 (90 Base) MCG/ACT inhaler Inhale 2 puffs into the lungs every 6 (six) hours as needed for wheezing or shortness of breath.   [name] aspirin EC 325 MG tablet  Take 325 mg by mouth daily. 03/12/2015: Received from: [facility] Received Sig: Take by mouth.  [name] atenolol (TENORMIN) 50 MG tablet Take 50 mg by mouth daily.  04/27/2013: Received from: External Pharmacy  . atorvastatin (LIPITOR) 80 MG tablet Take 80 mg by mouth at bedtime.  04/27/2013: Received from: External Pharmacy  . benzonatate (TESSALON) 200 MG capsule Take 1 capsule (200 mg total) by mouth 2 (two) times daily as needed for cough.   [name] glipiZIDE (GLUCOTROL XL) 10 MG 24 hr tablet TAKE 1 TABLET (10 MG TOTAL) BY MOUTH DAILY WITH BREAKFAST.   [name] guaiFENesin (DIABETIC TUSSIN EX) 100 MG/5ML liquid Take 10 mLs (200 mg total) by mouth 3 (three) times daily as needed for cough.   . metFORMIN (GLUCOPHAGE) 500 MG tablet Take 1 tablet (500 mg total) by mouth 2 (two) times daily with a meal. (Patient taking differently: Take 500 mg by mouth 2 (two) times daily with a meal. Pt states he takes 500mg  @ 1200, 1000mg  @ 1800)   . naproxen sodium (ALEVE) 220 MG tablet Take 220 mg by mouth 2 (two) times daily as needed (pain).   . nortriptyline (PAMELOR) 25 MG capsule TAKE 1 CAPSULE (25 MG TOTAL) BY MOUTH 2 (TWO) TIMES DAILY.   Omega-3 Fatty Acids (FISH OIL PO) Take 1 capsule by mouth daily.    . ramipril (ALTACE) 5 MG capsule Take 5 mg by mouth daily. Every morning 10/13/2014: Received from: External  Pharmacy   No facility-administered encounter medications on file as of 03/07/2019.     Surgical History: Past Surgical History:  Procedure Laterality Date  . ANGIOPLASTY     x 2  . CARDIAC SURGERY    . CATARACT EXTRACTION, BILATERAL  06/2015  . CORONARY ARTERY BYPASS GRAFT  1991  . HERNIA REPAIR      Medical History: Past Medical History:  Diagnosis Date  . BPH (benign prostatic hypertrophy)   . CKD (chronic kidney disease) stage 3, GFR 30-59 ml/min   . Diabetes ([place])   . Diabetic peripheral neuropathy ([place])   . HBP (high blood pressure)   . Heart trouble   . Hyperlipidemia   .  Hypertensive kidney disease   . MI (myocardial infarction) ([place]) 1991, 29    Family History: Family History  Problem Relation Age of Onset  . Diabetes Mother   . Heart disease Mother   . Hypertension Mother   . Emphysema Father   . Lung cancer Father   . Breast cancer Sister   . Heart disease Maternal Aunt   . Stroke Neg Hx     Social History   Socioeconomic History  . Marital status: Married    Spouse name: Not on file  . Number of children: Not on file  . Years of education: Not on file  . Highest education level: 12th grade  Occupational History  . Occupation: Surveyor, mining    Comment: fulltime   Social Needs  . Financial resource strain: Not hard at all  . Food insecurity    Worry: Never true    Inability: Never true  . Transportation needs    Medical: No    Non-medical: No  Tobacco Use  . Smoking status: Former Smoker    Types: Cigarettes    Quit date: 04/07/1984    Years since quitting: 34.9  . Smokeless tobacco: Never Used  Substance and Sexual Activity  . Alcohol use: No  . Drug use: No  . Sexual activity: Yes  Lifestyle  . Physical activity    Days per week: 0 days    Minutes per session: 0 min  . Stress: Not at all  Relationships  . Social connections    Talks on phone: More than three times a week    Gets together: More than three times a week    Attends religious service: More than 4 times per year    Active member of club or organization: No    Attends meetings of clubs or organizations: Never    Relationship status: Married  . Intimate partner violence    Fear of current or ex partner: No    Emotionally abused: No    Physically abused: No    Forced sexual activity: No  Other Topics Concern  . Not on file  Social History Narrative  . Not on file      Review of Systems Obviously short of breath Denies having chest pain no palpitations.  Neck denies headaches or dizziness No swelling of the legs. No nausea no vomiting no  diarrhea.  Vital Signs: Resp 16   Ht 5\' 7"  (1.702 m)   Wt 130 lb (59 kg)   BMI 20.36 kg/m    Observation/Objective: On observation patient is in obvious respiratory distress appears to be short of breath with even speaking with the examiner over the phone. No facial abnormalities Extraocular movements were grossly intact    Assessment/Plan:  1. Covid 19 virus infection this  portion has resolved however the patient will need to have a follow-up COVID-19 test and antibodies done then we can get a follow-up CT scan of the chest done to see how severe his underlying pulmonary disease is now 2. Covid 19 Pneumonia treated but he has residual deficits noted on the chest x-ray. 3. SOB secondary to above patient is going to be continued on oxygen therapy for now I have advised him to continue with 24 hours a day oxygen therapy and I did also speak with his employer that he needs to use oxygen at work if he is going to return to work. 4. 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 I have discussed any further diagnostic evaluation that may be needed or ordered today. We also reviewed his medications today. he has been encouraged to call the office with any questions or concerns that should arise related to todays visit.    Orders Placed This Encounter  Procedures  . SARS-COV-2 RNA,(COVID-19) QUAL NAAT  . CT Chest High Resolution  . SARS-CoV-2 Antibody, IgM  . Pulmonary function test    Meds ordered this encounter  Medications  . budesonide-formoterol (SYMBICORT) 160-4.5 MCG/ACT inhaler    Sig: Inhale 2 puffs into the lungs 2 (two) times daily.    Dispense:  1 Inhaler    Refill:  3    Time spent:30 Minutes    Yevonne PaxSaadat A Chinenye Katzenberger MD [facility] Pulmonary Medicine

## 2019-03-10 ENCOUNTER — Other Ambulatory Visit: Payer: Self-pay

## 2019-03-10 DIAGNOSIS — Z20822 Contact with and (suspected) exposure to covid-19: Secondary | ICD-10-CM

## 2019-03-13 LAB — NOVEL CORONAVIRUS, NAA: SARS-CoV-2, NAA: NOT DETECTED

## 2019-03-14 ENCOUNTER — Telehealth: Payer: Self-pay | Admitting: Nurse Practitioner

## 2019-03-14 ENCOUNTER — Encounter: Payer: Self-pay | Admitting: Nurse Practitioner

## 2019-03-14 ENCOUNTER — Telehealth: Payer: Self-pay

## 2019-03-14 DIAGNOSIS — J40 Bronchitis, not specified as acute or chronic: Secondary | ICD-10-CM | POA: Diagnosis not present

## 2019-03-14 DIAGNOSIS — E785 Hyperlipidemia, unspecified: Secondary | ICD-10-CM | POA: Diagnosis not present

## 2019-03-14 DIAGNOSIS — I252 Old myocardial infarction: Secondary | ICD-10-CM | POA: Diagnosis not present

## 2019-03-14 DIAGNOSIS — I2510 Atherosclerotic heart disease of native coronary artery without angina pectoris: Secondary | ICD-10-CM | POA: Diagnosis not present

## 2019-03-14 DIAGNOSIS — Z791 Long term (current) use of non-steroidal anti-inflammatories (NSAID): Secondary | ICD-10-CM | POA: Diagnosis not present

## 2019-03-14 DIAGNOSIS — N529 Male erectile dysfunction, unspecified: Secondary | ICD-10-CM | POA: Diagnosis not present

## 2019-03-14 DIAGNOSIS — I10 Essential (primary) hypertension: Secondary | ICD-10-CM | POA: Diagnosis not present

## 2019-03-14 DIAGNOSIS — Z20828 Contact with and (suspected) exposure to other viral communicable diseases: Secondary | ICD-10-CM | POA: Diagnosis not present

## 2019-03-14 DIAGNOSIS — Z7951 Long term (current) use of inhaled steroids: Secondary | ICD-10-CM | POA: Diagnosis not present

## 2019-03-14 DIAGNOSIS — E1142 Type 2 diabetes mellitus with diabetic polyneuropathy: Secondary | ICD-10-CM | POA: Diagnosis not present

## 2019-03-14 NOTE — Telephone Encounter (Signed)
Called Pt no answer. Sent letter.  °

## 2019-03-14 NOTE — Telephone Encounter (Signed)
Confirmed appointment with patient. klh °

## 2019-03-14 NOTE — Telephone Encounter (Signed)
Spoke to patient on telephone.  He is currently seeing pulmonology and cardiology for his ongoing difficulty with lung function after Covid pneumonia.  Discussed at length with him and reviewed recent notes.  Offered support.  Discussed with him that he can hold off on seeing me until beginning of January.  Please call and have him scheduled for second week in January to allow him time with specialists.

## 2019-03-14 NOTE — Telephone Encounter (Signed)
Pt called back in to make provider aware that he has some other apt with [facility] that he would like to get out the way first before seeing PCP. Pt says that he received a call to schedule ov.

## 2019-03-15 ENCOUNTER — Telehealth: Payer: Self-pay

## 2019-03-15 ENCOUNTER — Ambulatory Visit (INDEPENDENT_AMBULATORY_CARE_PROVIDER_SITE_OTHER): Payer: Medicare HMO | Admitting: Pharmacist

## 2019-03-15 DIAGNOSIS — N182 Chronic kidney disease, stage 2 (mild): Secondary | ICD-10-CM | POA: Diagnosis not present

## 2019-03-15 DIAGNOSIS — E1122 Type 2 diabetes mellitus with diabetic chronic kidney disease: Secondary | ICD-10-CM | POA: Diagnosis not present

## 2019-03-15 DIAGNOSIS — U071 COVID-19: Secondary | ICD-10-CM

## 2019-03-15 DIAGNOSIS — I129 Hypertensive chronic kidney disease with stage 1 through stage 4 chronic kidney disease, or unspecified chronic kidney disease: Secondary | ICD-10-CM | POA: Diagnosis not present

## 2019-03-15 DIAGNOSIS — J1289 Other viral pneumonia: Secondary | ICD-10-CM

## 2019-03-15 DIAGNOSIS — I2510 Atherosclerotic heart disease of native coronary artery without angina pectoris: Secondary | ICD-10-CM

## 2019-03-15 NOTE — Chronic Care Management (AMB) (Signed)
Chronic Care Management   Follow Up Note   03/15/2019 Name: OSSIEL MARCHIO MRN: [id number] DOB: 06-02-1945  Referred by: Marjie Skiff, NP Reason for referral : Chronic Care Management (Medication Management)   ILYAAS MUSTO is a 73 y.o. year old male who is a primary care patient of Cannady, Dorie Rank, NP. The CCM team was consulted for assistance with chronic disease management and care coordination needs.    Contacted patient for medication management review.   Review of patient status, including review of consultants reports, relevant laboratory and other test results, and collaboration with appropriate care team members and the patient's provider was performed as part of comprehensive patient evaluation and provision of chronic care management services.    SDOH (Social Determinants of Health) screening performed today: Stress. See Care Plan for related entries.   Outpatient Encounter Medications as of 03/15/2019  Medication Sig Note  . albuterol (VENTOLIN HFA) 108 (90 Base) MCG/ACT inhaler Inhale 2 puffs into the lungs every 6 (six) hours as needed for wheezing or shortness of breath.   [name] aspirin EC 325 MG tablet Take 325 mg by mouth daily.   [name] atenolol (TENORMIN) 50 MG tablet Take 50 mg by mouth daily.  03/15/2019: BP   . atorvastatin (LIPITOR) 80 MG tablet Take 80 mg by mouth at bedtime.    . budesonide-formoterol (SYMBICORT) 160-4.5 MCG/ACT inhaler Inhale 2 puffs into the lungs 2 (two) times daily.   [name] glipiZIDE (GLUCOTROL XL) 10 MG 24 hr tablet TAKE 1 TABLET (10 MG TOTAL) BY MOUTH DAILY WITH BREAKFAST.   [name] guaiFENesin (DIABETIC TUSSIN EX) 100 MG/5ML liquid Take 10 mLs (200 mg total) by mouth 3 (three) times daily as needed for cough.   . metFORMIN (GLUCOPHAGE) 500 MG tablet Take 1 tablet (500 mg total) by mouth 2 (two) times daily with a meal. (Patient taking differently: Take 500 mg by mouth 2 (two) times daily with a meal. Pt states he takes 500mg  @ 1200, 1000mg  @ 1800)   .  nortriptyline (PAMELOR) 25 MG capsule TAKE 1 CAPSULE (25 MG TOTAL) BY MOUTH 2 (TWO) TIMES DAILY.   Omega-3 Fatty Acids (FISH OIL PO) Take 1 capsule by mouth daily.    . ramipril (ALTACE) 5 MG capsule Take 5 mg by mouth daily. Every morning 03/15/2019: QAM  . benzonatate (TESSALON) 200 MG capsule Take 1 capsule (200 mg total) by mouth 2 (two) times daily as needed for cough. (Patient not taking: Reported on 03/15/2019)   . naproxen sodium (ALEVE) 220 MG tablet Take 220 mg by mouth 2 (two) times daily as needed (pain).    No facility-administered encounter medications on file as of 03/15/2019.      Goals Addressed            This Visit's Progress     Patient Stated   . PharmD "I want to get back to being healthy" (pt-stated)       Current Barriers:  . Polypharmacy; complex patient with multiple comorbidities including T2DM, CAD (CABG in 1991, MI in 1996, 2 stents in 2002); s/p COVID pneumonia; admitted at [facility] 01/24/2019-01/29/2019, and having virtual f/u with PCP ~weekly since discharge.  o S/p COVID: Established w/ pulmonology Dr. 01/26/2019 on 03/07/2019; prescribed Symbicort BID. Patient denies this giving any symptomatic benefit over albuterol HFA. PFT scheduled tomorrow, high res CT scheduled later this week. Patient is extremely disheartened by continued chronic cough; managed with diabetic Tussin o CAD: follows w/ [facility] cards, f/u w/  Dr. Melven Sartorius later this month. Atenolol 50 mg QPM, ramipril 5 mg QAM; atorvastatin 80 mg daily; most recent LDL at goal <70 o T2DM: last A1c 7.9% (though recent prednisone use); metformin 500 mg BID; glipizide XL 10 mg daily  Pharmacist Clinical Goal(s):  [name] Over the next 90 days, patient will work with PharmD and provider towards optimized medication management  Interventions: . Comprehensive medication review performed; medication list updated in electronic medical record . Reviewed PFT tomorrow. Generally, patients should hold inhaler therapy the morning of PFT.  Contacted Dr. Laurelyn Sickle office; left message on RN line asking if patient needs to hold Symbicort, and to call me or patient directly with this information.  . Discussed upcoming f/u with cardiology. LDL at goal, BP generally at goal.  . Discussed that moving forward, we can review antihyperglycemics that provide ASCVD risk reduction data. Consider swapping sulfonylurea for SGLT2. Will defer changes until acute tx and recovery from COVID is complete.   Patient Self Care Activities:  . Patient will take medications as prescribed  Initial goal documentation         Plan: - Will await call back from pulmonary regarding PFT instructions - Will outreach patient in ~5-6 weeks for continued medication management support  Catie Darnelle Maffucci, PharmD, [place] [phone]

## 2019-03-15 NOTE — Telephone Encounter (Signed)
Called pt scheduled for 1/12

## 2019-03-15 NOTE — Telephone Encounter (Signed)
Called informed patient not to use inhalers before appointment. klh

## 2019-03-15 NOTE — Patient Instructions (Signed)
Visit Information  Goals Addressed            This Visit's Progress     Patient Stated   . PharmD "I want to get back to being healthy" (pt-stated)       Current Barriers:  . Polypharmacy; complex patient with multiple comorbidities including T2DM, CAD (CABG in 1991, MI in 1996, 2 stents in 2002); s/p COVID pneumonia; admitted at [facility] 01/24/2019-01/29/2019, and having virtual f/u with PCP ~weekly since discharge.  o S/p COVID: Established w/ pulmonology Dr. Delma Freeze on 03/07/2019; prescribed Symbicort BID. Patient denies this giving any symptomatic benefit over albuterol HFA. PFT scheduled tomorrow, high res CT scheduled later this week. Patient is extremely disheartened by continued chronic cough; managed with diabetic Tussin o CAD: follows w/ [facility] cards, f/u w/ Dr. Melven Sartorius later this month. Atenolol 50 mg QPM, ramipril 5 mg QAM; atorvastatin 80 mg daily; most recent LDL at goal <70 o T2DM: last A1c 7.9% (though recent prednisone use); metformin 500 mg BID; glipizide XL 10 mg daily  Pharmacist Clinical Goal(s):  [name] Over the next 90 days, patient will work with PharmD and provider towards optimized medication management  Interventions: . Comprehensive medication review performed; medication list updated in electronic medical record . Reviewed PFT tomorrow. Generally, patients should hold inhaler therapy the morning of PFT. Contacted Dr. Laurelyn Sickle office; left message on RN line asking if patient needs to hold Symbicort, and to call me or patient directly with this information.  . Discussed upcoming f/u with cardiology. LDL at goal, BP generally at goal.  . Discussed that moving forward, we can review antihyperglycemics that provide ASCVD risk reduction data. Consider swapping sulfonylurea for SGLT2. Will defer changes until acute tx and recovery from COVID is complete.   Patient Self Care Activities:  . Patient will take medications as prescribed  Initial goal documentation        The patient  verbalized understanding of instructions provided today and declined a print copy of patient instruction materials.    Plan: - Will await call back from pulmonary regarding PFT instructions - Will outreach patient in ~5-6 weeks for continued medication management support  Catie Darnelle Maffucci, PharmD, [place] [phone]

## 2019-03-16 ENCOUNTER — Ambulatory Visit: Payer: Medicare HMO | Admitting: Internal Medicine

## 2019-03-16 ENCOUNTER — Other Ambulatory Visit: Payer: Self-pay

## 2019-03-16 DIAGNOSIS — R0602 Shortness of breath: Secondary | ICD-10-CM

## 2019-03-16 LAB — PULMONARY FUNCTION TEST

## 2019-03-17 ENCOUNTER — Ambulatory Visit
Admission: RE | Admit: 2019-03-17 | Discharge: 2019-03-17 | Disposition: A | Discharge status: Home / Self Care | Payer: Medicare HMO | Source: Ambulatory Visit | Attending: Internal Medicine | Admitting: Internal Medicine

## 2019-03-17 ENCOUNTER — Other Ambulatory Visit: Payer: Self-pay

## 2019-03-17 DIAGNOSIS — J849 Interstitial pulmonary disease, unspecified: Secondary | ICD-10-CM | POA: Insufficient documentation

## 2019-03-17 DIAGNOSIS — R0602 Shortness of breath: Secondary | ICD-10-CM | POA: Diagnosis not present

## 2019-03-20 NOTE — Procedures (Signed)
[facility] [place], [address]  DATE OF SERVICE: March 16, 2019  Complete Pulmonary Function Testing Interpretation:  FINDINGS:  The forced vital capacity is moderately decreased.  The FEV1 is 1.54 L which is 56% of predicted and is moderately decreased.  FEV1 FVC ratio is normal.  Postbronchodilator there does not appear to be significant improvement in the FEV1 however clinical improvement may occur in the absence of spirometric improvement and does not preclude the use of bronchodilators.  Total lung capacity is mildly decreased residual volume is increased residual in total lung capacity ratio is increased the FRC is within normal limits.  DLCO is severely decreased.  IMPRESSION:  This pulmonary function study is suggestive of mild restrictive lung disease.  On reviewing the flow volume loop there may be some effort related reduction in the volume is measured also.  The patient also had a severely reduced DLCO which was normal when corrected for alveolar volume.  Clinical correlation is recommended.  Allyne Gee, MD [facility] Pulmonary Critical Care Medicine Sleep Medicine

## 2019-03-22 ENCOUNTER — Telehealth: Payer: Self-pay

## 2019-03-22 ENCOUNTER — Ambulatory Visit: Payer: Medicare HMO | Admitting: Internal Medicine

## 2019-03-22 DIAGNOSIS — Z8616 Personal history of COVID-19: Secondary | ICD-10-CM | POA: Insufficient documentation

## 2019-03-22 DIAGNOSIS — E782 Mixed hyperlipidemia: Secondary | ICD-10-CM | POA: Diagnosis not present

## 2019-03-22 DIAGNOSIS — R0600 Dyspnea, unspecified: Secondary | ICD-10-CM | POA: Diagnosis not present

## 2019-03-22 DIAGNOSIS — I2510 Atherosclerotic heart disease of native coronary artery without angina pectoris: Secondary | ICD-10-CM | POA: Diagnosis not present

## 2019-03-22 DIAGNOSIS — I10 Essential (primary) hypertension: Secondary | ICD-10-CM | POA: Diagnosis not present

## 2019-03-22 DIAGNOSIS — Z8619 Personal history of other infectious and parasitic diseases: Secondary | ICD-10-CM | POA: Diagnosis not present

## 2019-03-22 NOTE — Telephone Encounter (Signed)
Confirmed appointment with patient. klh °

## 2019-03-23 NOTE — Telephone Encounter (Signed)
Erroneous entry

## 2019-03-24 ENCOUNTER — Ambulatory Visit: Payer: Medicare HMO | Admitting: Internal Medicine

## 2019-03-24 ENCOUNTER — Encounter: Payer: Self-pay | Admitting: Internal Medicine

## 2019-03-24 ENCOUNTER — Other Ambulatory Visit: Payer: Self-pay

## 2019-03-24 VITALS — BP 115/71 | HR 87 | Resp 16 | Ht 67.0 in | Wt 135.0 lb

## 2019-03-24 DIAGNOSIS — U071 COVID-19: Secondary | ICD-10-CM | POA: Diagnosis not present

## 2019-03-24 DIAGNOSIS — R0602 Shortness of breath: Secondary | ICD-10-CM | POA: Diagnosis not present

## 2019-03-24 DIAGNOSIS — R0902 Hypoxemia: Secondary | ICD-10-CM

## 2019-03-24 DIAGNOSIS — J1289 Other viral pneumonia: Secondary | ICD-10-CM

## 2019-03-24 DIAGNOSIS — J849 Interstitial pulmonary disease, unspecified: Secondary | ICD-10-CM

## 2019-03-24 DIAGNOSIS — J1282 Pneumonia due to coronavirus disease 2019: Secondary | ICD-10-CM

## 2019-03-24 NOTE — Progress Notes (Signed)
[facility] [address] [place], [place] [address]  Pulmonary Sleep Medicine   Office Visit Note  Patient Name: Matthew Wolf DOB: February 19, 1946 MRN [id number]  Date of Service: 03/24/2019  Complaints/HPI: Patient is here for follow-up he is a Covid recovery patient he has been still having significant shortness of breath post Covid.  We did a pulmonary function test which shows that he has restrictive lung disease.  Chest x-ray had also shown significant interstitial lung disease and then we also had a CT scan of the chest done which revealed that he has significant fibrotic changes.  I was able to review an old chest x-ray from 2016 which essentially looks normal.  The patient does have shortness of breath with exertion still and he says that he is using his oxygen I stressed to him the importance of using the oxygen 24/7 as we discussed over the phone.  Currently sitting around his sats are fine however he desaturates so we will check a 6-minute walk on today.  No cough no congestion.  Denies having any chest pain no weight loss.  ROS  General: (-) fever, (-) chills, (-) night sweats, (-) weakness Skin: (-) rashes, (-) itching,. Eyes: (-) visual changes, (-) redness, (-) itching. Nose and Sinuses: (-) nasal stuffiness or itchiness, (-) postnasal drip, (-) nosebleeds, (-) sinus trouble. Mouth and Throat: (-) sore throat, (-) hoarseness. Neck: (-) swollen glands, (-) enlarged thyroid, (-) neck pain. Respiratory: + cough, (-) bloody sputum, + shortness of breath, - wheezing. Cardiovascular: - ankle swelling, (-) chest pain. Lymphatic: (-) lymph node enlargement. Neurologic: (-) numbness, (-) tingling. Psychiatric: (-) anxiety, (-) depression   Current Medication: Outpatient Encounter Medications as of 03/24/2019  Medication Sig Note  . albuterol (VENTOLIN HFA) 108 (90 Base) MCG/ACT inhaler Inhale 2 puffs into the lungs every 6 (six) hours as needed for wheezing or  shortness of breath.   [name] aspirin EC 325 MG tablet Take 325 mg by mouth daily.   [name] atenolol (TENORMIN) 50 MG tablet Take 50 mg by mouth daily.  03/15/2019: BP   . atorvastatin (LIPITOR) 80 MG tablet Take 80 mg by mouth at bedtime.    . benzonatate (TESSALON) 200 MG capsule Take 1 capsule (200 mg total) by mouth 2 (two) times daily as needed for cough. (Patient not taking: Reported on 03/15/2019)   . budesonide-formoterol (SYMBICORT) 160-4.5 MCG/ACT inhaler Inhale 2 puffs into the lungs 2 (two) times daily.   [name] glipiZIDE (GLUCOTROL XL) 10 MG 24 hr tablet TAKE 1 TABLET (10 MG TOTAL) BY MOUTH DAILY WITH BREAKFAST.   [name] guaiFENesin (DIABETIC TUSSIN EX) 100 MG/5ML liquid Take 10 mLs (200 mg total) by mouth 3 (three) times daily as needed for cough.   . metFORMIN (GLUCOPHAGE) 500 MG tablet Take 1 tablet (500 mg total) by mouth 2 (two) times daily with a meal. (Patient taking differently: Take 500 mg by mouth 2 (two) times daily with a meal. Pt states he takes  @ 1200,  @ 1800)   . naproxen sodium (ALEVE) 220 MG tablet Take 220 mg by mouth 2 (two) times daily as needed (pain).   . nortriptyline (PAMELOR) 25 MG capsule TAKE 1 CAPSULE (25 MG TOTAL) BY MOUTH 2 (TWO) TIMES DAILY.   [name] Omega-3 Fatty Acids (FISH OIL PO) Take 1 capsule by mouth daily.    . ramipril (ALTACE) 5 MG capsule Take 5 mg by mouth daily. Every morning 03/15/2019: QAM   No facility-administered encounter medications on  file as of 03/24/2019.    Surgical History: Past Surgical History:  Procedure Laterality Date  . ANGIOPLASTY     x 2  . CARDIAC SURGERY    . CATARACT EXTRACTION, BILATERAL  06/2015  . CORONARY ARTERY BYPASS GRAFT  1991  . HERNIA REPAIR      Medical History: Past Medical History:  Diagnosis Date  . BPH (benign prostatic hypertrophy)   . CKD (chronic kidney disease) stage 3, GFR 30-59 ml/min   . Diabetes (HCC)   . Diabetic peripheral neuropathy (HCC)   . HBP (high blood pressure)   . Heart trouble   .  Hyperlipidemia   . Hypertensive kidney disease   . MI (myocardial infarction) (HCC) 1991, 77    Family History: Family History  Problem Relation Age of Onset  . Diabetes Mother   . Heart disease Mother   . Hypertension Mother   . Emphysema Father   . Lung cancer Father   . Breast cancer Sister   . Heart disease Maternal Aunt   . Stroke Neg Hx     Social History: Social History   Socioeconomic History  . Marital status: Married    Spouse name: Not on file  . Number of children: Not on file  . Years of education: Not on file  . Highest education level: 12th grade  Occupational History  . Occupation: Data processing manager    Comment: fulltime   Tobacco Use  . Smoking status: Former Smoker    Types: Cigarettes    Quit date: 04/07/1984    Years since quitting: 34.9  . Smokeless tobacco: Never Used  Substance and Sexual Activity  . Alcohol use: No  . Drug use: No  . Sexual activity: Yes  Other Topics Concern  . Not on file  Social History Narrative  . Not on file   Social Determinants of Health   Financial Resource Strain:   . Difficulty of Paying Living Expenses: Not on file  Food Insecurity:   . Worried About Programme researcher, broadcasting/film/video in the Last Year: Not on file  . Ran Out of Food in the Last Year: Not on file  Transportation Needs:   . Lack of Transportation (Medical): Not on file  . Lack of Transportation (Non-Medical): Not on file  Physical Activity:   . Days of Exercise per Week: Not on file  . Minutes of Exercise per Session: Not on file  Stress:   . Feeling of Stress : Not on file  Social Connections:   . Frequency of Communication with Friends and Family: Not on file  . Frequency of Social Gatherings with Friends and Family: Not on file  . Attends Religious Services: Not on file  . Active Member of Clubs or Organizations: Not on file  . Attends Banker Meetings: Not on file  . Marital Status: Not on file  Intimate Partner Violence:   . Fear of  Current or Ex-Partner: Not on file  . Emotionally Abused: Not on file  . Physically Abused: Not on file  . Sexually Abused: Not on file    Vital Signs: Blood pressure 115/71, pulse 87, resp. rate 16, height  (1.702 m), weight 135 lb (61.2 kg), SpO2 96 %.  Examination: General Appearance: The patient is well-developed, well-nourished, and in no distress. Skin: Gross inspection of skin unremarkable. Head: normocephalic, no gross deformities. Eyes: no gross deformities noted. ENT: ears appear grossly normal no exudates. Neck: Supple. No thyromegaly. No LAD. Respiratory: few  crackles noted. Cardiovascular: Normal S1 and S2 without murmur or rub. Extremities: No cyanosis. pulses are equal. Neurologic: Alert and oriented. No involuntary movements.  LABS: Recent Results (from the past 2160 hour(s))  Novel Coronavirus, NAA ([facility])     Status: Abnormal   Collection Time: 01/14/19 12:00 AM   Specimen: Oropharyngeal(OP) collection in vial transport medium   OROPHARYNGEA  TESTING  Result Value Ref Range   SARS-CoV-2, NAA Detected (A) Not Detected    Comment: Testing was performed using the cobas(R) SARS-CoV-2 test. This nucleic acid amplification test was developed and its performance characteristics determined by [facility]. Nucleic acid amplification tests include PCR and TMA. This test has not been FDA cleared or approved. This test has been authorized by FDA under an Emergency Use Authorization (EUA). This test is only authorized for the duration of time the declaration that circumstances exist justifying the authorization of the emergency use of in vitro diagnostic tests for detection of SARS-CoV-2 virus and/or diagnosis of COVID-19 infection under section 564(b)(1) of the Act, 21 U.S.C. 161WRU-0(A360bbb-3(b) (1), unless the authorization is terminated or revoked sooner. When diagnostic testing is negative, the possibility of a false negative result should be considered in  the context of a patient's recent exposures and the presence of clinical signs and symptoms consistent with COVID-19. An individual without symptoms  of COVID-19 and who is not shedding SARS-CoV-2 virus would expect to have a negative (not detected) result in this assay.   CBC     Status: None   Collection Time: 01/24/19  3:03 PM  Result Value Ref Range   WBC 8.4 4.0 - 10.5 K/uL   RBC 4.27 4.22 - 5.81 MIL/uL   Hemoglobin 13.6 13.0 - 17.0 g/dL   HCT 54.039.7 98.139.0 - 19.152.0 %   MCV 93.0 80.0 - 100.0 fL   MCH 31.9 26.0 - 34.0 pg   MCHC 34.3 30.0 - 36.0 g/dL   RDW 47.812.7 29.511.5 - 62.115.5 %   Platelets 208 150 - 400 K/uL   nRBC 0.0 0.0 - 0.2 %    Comment: Performed at [facility], [address] [place], [place] [address]  Comprehensive metabolic panel     Status: Abnormal   Collection Time: 01/24/19  3:57 PM  Result Value Ref Range   Sodium 132 (L) 135 - 145 mmol/L   Potassium 5.0 3.5 - 5.1 mmol/L   Chloride 100 98 - 111 mmol/L   CO2 20 (L) 22 - 32 mmol/L   Glucose, Bld 195 (H) 70 - 99 mg/dL   BUN 30 (H) 8 - 23 mg/dL   Creatinine, Ser 7.841.06 0.61 - 1.24 mg/dL   Calcium 8.5 (L) 8.9 - 10.3 mg/dL   Total Protein 7.2 6.5 - 8.1 g/dL   Albumin 3.5 3.5 - 5.0 g/dL   AST 34 15 - 41 U/L   ALT 26 0 - 44 U/L   Alkaline Phosphatase 106 38 - 126 U/L   Total Bilirubin 0.8 0.3 - 1.2 mg/dL   GFR calc non Af Amer >60 >60 mL/min   GFR calc Af Amer >60 >60 mL/min   Anion gap 12 5 - 15    Comment: Performed at [facility], [address] [place], [place] [address]  C-reactive protein     Status: Abnormal   Collection Time: 01/24/19  5:07 PM  Result Value Ref Range   CRP 9.7 (H) <1.0 mg/dL    Comment: Performed at [facility], [address]  [place], [place] [address]  Fibrin derivatives D-Dimer ([facility] only)     Status: Abnormal   Collection Time: 01/24/19  5:07 PM  Result Value Ref Range   Fibrin derivatives D-dimer ([facility]) 1,461.50 (H) 0.00 - 499.00 ng/mL (FEU)    Comment:  (NOTE) <> Exclusion of Venous Thromboembolism (VTE) - OUTPATIENT ONLY   (Emergency Department or [place])   0-499 ng/ml (FEU): With a low to intermediate pretest probability                      for VTE this test result excludes the diagnosis                      of VTE.   >499 ng/ml (FEU) : VTE not excluded; additional work up for VTE is                      required. <> Testing on Inpatients and Evaluation of Disseminated Intravascular   Coagulation (DIC) Reference Range:   0-499 ng/ml (FEU) Performed at [facility], [address] [place], [place] [address]   Type and screen     Status: None   Collection Time: 01/24/19  5:07 PM  Result Value Ref Range   ABO/RH(D) A POS    Antibody Screen NEG    Sample Expiration      01/27/2019,2359 Performed at [facility], [address] [place], [place] [address]   Glucose, capillary     Status: Abnormal   Collection Time: 01/25/19 12:43 AM  Result Value Ref Range   Glucose-Capillary 296 (H) 70 - 99 mg/dL  ABO/Rh     Status: None   Collection Time: 01/25/19  5:30 AM  Result Value Ref Range   ABO/RH(D)      A POS Performed at [facility], [address], [place], [place] [address]   CBC with Differential/Platelet     Status: Abnormal   Collection Time: 01/25/19  5:30 AM  Result Value Ref Range   WBC 5.4 4.0 - 10.5 K/uL   RBC 3.74 (L) 4.22 - 5.81 MIL/uL   Hemoglobin 12.1 (L) 13.0 - 17.0 g/dL   HCT 84.6 (L) 96.2 - 95.2 %   MCV 94.9 80.0 - 100.0 fL   MCH 32.4 26.0 - 34.0 pg   MCHC 34.1 30.0 - 36.0 g/dL   RDW 84.1 32.4 - 40.1 %   Platelets 223 150 - 400 K/uL   nRBC 0.0 0.0 - 0.2 %   Neutrophils Relative % 75 %   Neutro Abs 4.1 1.7 - 7.7 K/uL   Lymphocytes Relative 14 %   Lymphs Abs 0.8 0.7 - 4.0 K/uL   Monocytes Relative 5 %   Monocytes Absolute 0.3 0.1 - 1.0 K/uL   Eosinophils Relative 0 %   Eosinophils Absolute 0.0 0.0 - 0.5 K/uL   Basophils Relative 0 %   Basophils Absolute 0.0 0.0  - 0.1 K/uL   Immature Granulocytes 6 %   Abs Immature Granulocytes 0.31 (H) 0.00 - 0.07 K/uL    Comment: Performed at [facility], [address] [place], [place] [address]  Comprehensive metabolic panel     Status: Abnormal   Collection Time: 01/25/19  5:30 AM  Result Value Ref Range   Sodium 129 (L) 135 - 145 mmol/L   Potassium 4.7 3.5 - 5.1 mmol/L   Chloride 101 98 - 111 mmol/L   CO2 17 (L) 22 -  32 mmol/L   Glucose, Bld 381 (H) 70 - 99 mg/dL   BUN 34 (H) 8 - 23 mg/dL   Creatinine, Ser 2.99 0.61 - 1.24 mg/dL   Calcium 7.8 (L) 8.9 - 10.3 mg/dL   Total Protein 6.3 (L) 6.5 - 8.1 g/dL   Albumin 3.1 (L) 3.5 - 5.0 g/dL   AST 26 15 - 41 U/L   ALT 24 0 - 44 U/L   Alkaline Phosphatase 95 38 - 126 U/L   Total Bilirubin 0.8 0.3 - 1.2 mg/dL   GFR calc non Af Amer >60 >60 mL/min   GFR calc Af Amer >60 >60 mL/min   Anion gap 11 5 - 15    Comment: Performed at [facility], [address], [place], [place] [address]  C-reactive protein     Status: Abnormal   Collection Time: 01/25/19  5:30 AM  Result Value Ref Range   CRP 11.2 (H) <1.0 mg/dL    Comment: Performed at [facility], [address], [place], [place] [address]  D-dimer, quantitative (not at [facility])     Status: Abnormal   Collection Time: 01/25/19  5:30 AM  Result Value Ref Range   D-Dimer, Quant 1.23 (H) 0.00 - 0.50 ug/mL-FEU    Comment: (NOTE) At the manufacturer cut-off of 0.50 ug/mL FEU, this assay has been documented to exclude PE with a sensitivity and negative predictive value of 97 to 99%.  At this time, this assay has not been approved by the FDA to exclude DVT/VTE. Results should be correlated with clinical presentation. Performed at [facility], [address], [place], [place] [address]   Hemoglobin A1c     Status: Abnormal   Collection Time: 01/25/19  5:30 AM  Result Value Ref Range   Hgb A1c MFr Bld 7.9 (H) 4.8 - 5.6 %    Comment:  (NOTE) Pre diabetes:          5.7%-6.4% Diabetes:              >6.4% Glycemic control for   <7.0% adults with diabetes    Mean Plasma Glucose 180.03 mg/dL    Comment: Performed at [facility], [address] [place], [place] [address]  Type and screen [facility]     Status: None   Collection Time: 01/25/19  5:30 AM  Result Value Ref Range   ABO/RH(D) A POS    Antibody Screen NEG    Sample Expiration      01/28/2019,2359 Performed at [facility], [address] [place], [place] [address]   Brain natriuretic peptide     Status: Abnormal   Collection Time: 01/25/19  5:30 AM  Result Value Ref Range   B Natriuretic Peptide 575.2 (H) 0.0 - 100.0 pg/mL    Comment: Performed at [facility], [address] [place], [place] [address]  Osmolality     Status: Abnormal   Collection Time: 01/25/19  6:30 AM  Result Value Ref Range   Osmolality 297 (H) 275 - 295 mOsm/kg    Comment: Performed at [facility], [address], [place], [place] [address]  Uric acid     Status: None   Collection Time: 01/25/19  6:30 AM  Result Value Ref Range   Uric Acid, Serum 6.1 3.7 - 8.6 mg/dL    Comment: Performed at [facility], [address] [place], [place] [address]  Glucose,  capillary     Status: Abnormal   Collection Time: 01/25/19  8:01 AM  Result Value Ref Range   Glucose-Capillary 348 (H) 70 - 99 mg/dL  Creatinine, urine, random     Status: None   Collection Time: 01/25/19 10:21 AM  Result Value Ref Range   Creatinine, Urine 30.81 mg/dL    Comment: Performed at [facility], [address], [place], [place] [address]  Sodium, urine, random     Status: None   Collection Time: 01/25/19 10:21 AM  Result Value Ref Range   Sodium, Ur 41 mmol/L    Comment: Performed at [facility], [address] [place], [place] [address]  Osmolality, urine     Status: None    Collection Time: 01/25/19 10:21 AM  Result Value Ref Range   Osmolality, Ur 582 300 - 900 mOsm/kg    Comment: Performed at [facility], [address], [place], [place] [address]  Glucose, capillary     Status: Abnormal   Collection Time: 01/25/19 11:13 AM  Result Value Ref Range   Glucose-Capillary 445 (H) 70 - 99 mg/dL  Glucose, capillary     Status: Abnormal   Collection Time: 01/25/19  3:34 PM  Result Value Ref Range   Glucose-Capillary 410 (H) 70 - 99 mg/dL  Glucose, capillary     Status: Abnormal   Collection Time: 01/25/19  8:15 PM  Result Value Ref Range   Glucose-Capillary 235 (H) 70 - 99 mg/dL  CBC with Differential/Platelet     Status: Abnormal   Collection Time: 01/26/19  6:00 AM  Result Value Ref Range   WBC 15.7 (H) 4.0 - 10.5 K/uL   RBC 3.76 (L) 4.22 - 5.81 MIL/uL   Hemoglobin 12.2 (L) 13.0 - 17.0 g/dL   HCT 91.4 (L) 78.2 - 95.6 %   MCV 94.1 80.0 - 100.0 fL   MCH 32.4 26.0 - 34.0 pg   MCHC 34.5 30.0 - 36.0 g/dL   RDW 21.3 08.6 - 57.8 %   Platelets 307 150 - 400 K/uL   nRBC 0.0 0.0 - 0.2 %   Neutrophils Relative % 78 %   Neutro Abs 12.4 (H) 1.7 - 7.7 K/uL   Lymphocytes Relative 11 %   Lymphs Abs 1.7 0.7 - 4.0 K/uL   Monocytes Relative 7 %   Monocytes Absolute 1.0 0.1 - 1.0 K/uL   Eosinophils Relative 0 %   Eosinophils Absolute 0.0 0.0 - 0.5 K/uL   Basophils Relative 0 %   Basophils Absolute 0.0 0.0 - 0.1 K/uL   Immature Granulocytes 4 %   Abs Immature Granulocytes 0.55 (H) 0.00 - 0.07 K/uL    Comment: Performed at [facility], [address], [place], [place] [address]  Comprehensive metabolic panel     Status: Abnormal   Collection Time: 01/26/19  6:00 AM  Result Value Ref Range   Sodium 132 (L) 135 - 145 mmol/L   Potassium 4.3 3.5 - 5.1 mmol/L   Chloride 101 98 - 111 mmol/L   CO2 20 (L) 22 - 32 mmol/L   Glucose, Bld 169 (H) 70 - 99 mg/dL   BUN 33 (H) 8 - 23 mg/dL   Creatinine, Ser 9.52 0.61 - 1.24 mg/dL   Calcium 8.6 (L)  8.9 - 10.3 mg/dL   Total Protein 6.2 (L) 6.5 - 8.1 g/dL   Albumin 3.0 (L) 3.5 - 5.0 g/dL   AST 30 15 - 41 U/L  ALT 29 0 - 44 U/L   Alkaline Phosphatase 91 38 - 126 U/L   Total Bilirubin 0.5 0.3 - 1.2 mg/dL   GFR calc non Af Amer >60 >60 mL/min   GFR calc Af Amer >60 >60 mL/min   Anion gap 11 5 - 15    Comment: Performed at [facility], [place] [address] [place], [place] [address]  C-reactive protein     Status: Abnormal   Collection Time: 01/26/19  6:00 AM  Result Value Ref Range   CRP 7.4 (H) <1.0 mg/dL    Comment: Performed at [facility], [place] [address], [place], [place] [address]  D-dimer, quantitative (not at [facility])     Status: Abnormal   Collection Time: 01/26/19  6:00 AM  Result Value Ref Range   D-Dimer, Quant 0.66 (H) 0.00 - 0.50 ug/mL-FEU    Comment: (NOTE) At the manufacturer cut-off of 0.50 ug/mL FEU, this assay has been documented to exclude PE with a sensitivity and negative predictive value of 97 to 99%.  At this time, this assay has not been approved by the FDA to exclude DVT/VTE. Results should be correlated with clinical presentation. Performed at [facility], [place] [address] [place], [place] [address]   Brain natriuretic peptide     Status: Abnormal   Collection Time: 01/26/19  6:00 AM  Result Value Ref Range   B Natriuretic Peptide 852.1 (H) 0.0 - 100.0 pg/mL    Comment: Performed at [facility], [place] [address], [place], [place] [address]  Glucose, capillary     Status: Abnormal   Collection Time: 01/26/19  8:08 AM  Result Value Ref Range   Glucose-Capillary 185 (H) 70 - 99 mg/dL  Glucose, capillary     Status: Abnormal   Collection Time: 01/26/19 12:16 PM  Result Value Ref Range   Glucose-Capillary 447 (H) 70 - 99 mg/dL  Glucose, capillary     Status: Abnormal   Collection Time: 01/26/19  5:15 PM  Result Value Ref Range   Glucose-Capillary 338 (H) 70 - 99 mg/dL  Glucose,  capillary     Status: Abnormal   Collection Time: 01/26/19  8:35 PM  Result Value Ref Range   Glucose-Capillary 312 (H) 70 - 99 mg/dL  CBC with Differential/Platelet     Status: Abnormal   Collection Time: 01/27/19  5:30 AM  Result Value Ref Range   WBC 17.7 (H) 4.0 - 10.5 K/uL   RBC 3.81 (L) 4.22 - 5.81 MIL/uL   Hemoglobin 12.2 (L) 13.0 - 17.0 g/dL   HCT 36.0 (L) 39.0 - 52.0 %   MCV 94.5 80.0 - 100.0 fL   MCH 32.0 26.0 - 34.0 pg   MCHC 33.9 30.0 - 36.0 g/dL   RDW 13.1 11.5 - 15.5 %   Platelets 347 150 - 400 K/uL   nRBC 0.0 0.0 - 0.2 %   Neutrophils Relative % 86 %   Neutro Abs 15.1 (H) 1.7 - 7.7 K/uL   Lymphocytes Relative 7 %   Lymphs Abs 1.3 0.7 - 4.0 K/uL   Monocytes Relative 5 %   Monocytes Absolute 0.9 0.1 - 1.0 K/uL   Eosinophils Relative 0 %   Eosinophils Absolute 0.0 0.0 - 0.5 K/uL   Basophils Relative 0 %   Basophils Absolute 0.1 0.0 - 0.1 K/uL   Immature Granulocytes 2 %   Abs Immature Granulocytes 0.35 (H) 0.00 - 0.07 K/uL    Comment: Performed at  [facility], [address] [place], [place] [address]  Comprehensive metabolic panel     Status: Abnormal   Collection Time: 01/27/19  5:30 AM  Result Value Ref Range   Sodium 135 135 - 145 mmol/L   Potassium 4.9 3.5 - 5.1 mmol/L   Chloride 102 98 - 111 mmol/L   CO2 22 22 - 32 mmol/L   Glucose, Bld 157 (H) 70 - 99 mg/dL   BUN 32 (H) 8 - 23 mg/dL   Creatinine, Ser 6.04 0.61 - 1.24 mg/dL   Calcium 9.0 8.9 - 54.0 mg/dL   Total Protein 6.1 (L) 6.5 - 8.1 g/dL   Albumin 2.9 (L) 3.5 - 5.0 g/dL   AST 21 15 - 41 U/L   ALT 27 0 - 44 U/L   Alkaline Phosphatase 95 38 - 126 U/L   Total Bilirubin 0.6 0.3 - 1.2 mg/dL   GFR calc non Af Amer >60 >60 mL/min   GFR calc Af Amer >60 >60 mL/min   Anion gap 11 5 - 15    Comment: Performed at [facility], [address] [place], [place] [address]  C-reactive protein     Status: Abnormal   Collection Time: 01/27/19  5:30 AM  Result Value Ref  Range   CRP 3.6 (H) <1.0 mg/dL    Comment: Performed at [facility], [address] [place], [place] [address]  D-dimer, quantitative (not at [facility])     Status: Abnormal   Collection Time: 01/27/19  5:30 AM  Result Value Ref Range   D-Dimer, Quant 0.76 (H) 0.00 - 0.50 ug/mL-FEU    Comment: (NOTE) At the manufacturer cut-off of 0.50 ug/mL FEU, this assay has been documented to exclude PE with a sensitivity and negative predictive value of 97 to 99%.  At this time, this assay has not been approved by the FDA to exclude DVT/VTE. Results should be correlated with clinical presentation. Performed at [facility], [address], [place], [place] [address]   Brain natriuretic peptide     Status: Abnormal   Collection Time: 01/27/19  5:30 AM  Result Value Ref Range   B Natriuretic Peptide 940.6 (H) 0.0 - 100.0 pg/mL    Comment: Performed at [facility], [address], [place], [place] [address]  Glucose, capillary     Status: Abnormal   Collection Time: 01/27/19  8:00 AM  Result Value Ref Range   Glucose-Capillary 179 (H) 70 - 99 mg/dL  Glucose, capillary     Status: Abnormal   Collection Time: 01/27/19 11:46 AM  Result Value Ref Range   Glucose-Capillary 509 (HH) 70 - 99 mg/dL   Comment 1 NOTIFY RN   Glucose, capillary     Status: Abnormal   Collection Time: 01/27/19  1:00 PM  Result Value Ref Range   Glucose-Capillary 413 (H) 70 - 99 mg/dL  Glucose, capillary     Status: Abnormal   Collection Time: 01/27/19  4:17 PM  Result Value Ref Range   Glucose-Capillary 249 (H) 70 - 99 mg/dL  Glucose, capillary     Status: Abnormal   Collection Time: 01/27/19  9:00 PM  Result Value Ref Range   Glucose-Capillary 112 (H) 70 - 99 mg/dL  CBC with Differential/Platelet     Status: Abnormal   Collection Time: 01/28/19  1:31 AM  Result Value Ref Range   WBC 17.6 (H) 4.0 - 10.5 K/uL   RBC 4.14 (L) 4.22 -  5.81 MIL/uL   Hemoglobin 13.4  13.0 - 17.0 g/dL   HCT 16.1 (L) 09.6 - 04.5 %   MCV 93.7 80.0 - 100.0 fL   MCH 32.4 26.0 - 34.0 pg   MCHC 34.5 30.0 - 36.0 g/dL   RDW 40.9 81.1 - 91.4 %   Platelets 395 150 - 400 K/uL   nRBC 0.0 0.0 - 0.2 %   Neutrophils Relative % 86 %   Neutro Abs 15.2 (H) 1.7 - 7.7 K/uL   Lymphocytes Relative 7 %   Lymphs Abs 1.2 0.7 - 4.0 K/uL   Monocytes Relative 5 %   Monocytes Absolute 0.8 0.1 - 1.0 K/uL   Eosinophils Relative 0 %   Eosinophils Absolute 0.0 0.0 - 0.5 K/uL   Basophils Relative 0 %   Basophils Absolute 0.1 0.0 - 0.1 K/uL   Immature Granulocytes 2 %   Abs Immature Granulocytes 0.36 (H) 0.00 - 0.07 K/uL    Comment: Performed at [facility], [address], [place], [place] [address]  Comprehensive metabolic panel     Status: Abnormal   Collection Time: 01/28/19  1:31 AM  Result Value Ref Range   Sodium 137 135 - 145 mmol/L   Potassium 4.9 3.5 - 5.1 mmol/L   Chloride 102 98 - 111 mmol/L   CO2 24 22 - 32 mmol/L   Glucose, Bld 64 (L) 70 - 99 mg/dL   BUN 30 (H) 8 - 23 mg/dL   Creatinine, Ser 6.21 0.61 - 1.24 mg/dL   Calcium 9.5 8.9 - 30.8 mg/dL   Total Protein 6.5 6.5 - 8.1 g/dL   Albumin 3.1 (L) 3.5 - 5.0 g/dL   AST 21 15 - 41 U/L   ALT 29 0 - 44 U/L   Alkaline Phosphatase 105 38 - 126 U/L   Total Bilirubin 0.8 0.3 - 1.2 mg/dL   GFR calc non Af Amer >60 >60 mL/min   GFR calc Af Amer >60 >60 mL/min   Anion gap 11 5 - 15    Comment: Performed at [facility], [address], [place], [place] [address]  C-reactive protein     Status: Abnormal   Collection Time: 01/28/19  1:31 AM  Result Value Ref Range   CRP 2.5 (H) <1.0 mg/dL    Comment: Performed at [facility], [address] [place], [place] [address]  D-dimer, quantitative (not at [facility])     Status: Abnormal   Collection Time: 01/28/19  1:31 AM  Result Value Ref Range   D-Dimer, Quant 0.79 (H) 0.00 - 0.50 ug/mL-FEU    Comment: (NOTE) At the manufacturer  cut-off of 0.50 ug/mL FEU, this assay has been documented to exclude PE with a sensitivity and negative predictive value of 97 to 99%.  At this time, this assay has not been approved by the FDA to exclude DVT/VTE. Results should be correlated with clinical presentation. Performed at [facility], [address] [place], [place] [address]   Glucose, capillary     Status: Abnormal   Collection Time: 01/28/19  8:13 AM  Result Value Ref Range   Glucose-Capillary 68 (L) 70 - 99 mg/dL  Glucose, capillary     Status: Abnormal   Collection Time: 01/28/19  9:10 AM  Result Value Ref Range   Glucose-Capillary 132 (H) 70 - 99 mg/dL  Glucose, capillary     Status: Abnormal   Collection Time: 01/28/19 11:50 AM  Result Value Ref Range  Glucose-Capillary 449 (H) 70 - 99 mg/dL  Glucose, random     Status: Abnormal   Collection Time: 01/28/19 12:30 PM  Result Value Ref Range   Glucose, Bld 419 (H) 70 - 99 mg/dL    Comment: Performed at [facility], [address], [place], [place] [address]  Glucose, capillary     Status: Abnormal   Collection Time: 01/28/19  4:38 PM  Result Value Ref Range   Glucose-Capillary 288 (H) 70 - 99 mg/dL  Glucose, capillary     Status: None   Collection Time: 01/28/19  9:37 PM  Result Value Ref Range   Glucose-Capillary 88 70 - 99 mg/dL  CBC with Differential/Platelet     Status: Abnormal   Collection Time: 01/29/19  1:00 AM  Result Value Ref Range   WBC 20.0 (H) 4.0 - 10.5 K/uL   RBC 4.35 4.22 - 5.81 MIL/uL   Hemoglobin 14.0 13.0 - 17.0 g/dL   HCT 60.4 54.0 - 98.1 %   MCV 94.3 80.0 - 100.0 fL   MCH 32.2 26.0 - 34.0 pg   MCHC 34.1 30.0 - 36.0 g/dL   RDW 19.1 47.8 - 29.5 %   Platelets 423 (H) 150 - 400 K/uL   nRBC 0.0 0.0 - 0.2 %   Neutrophils Relative % 90 %   Neutro Abs 17.8 (H) 1.7 - 7.7 K/uL   Lymphocytes Relative 6 %   Lymphs Abs 1.2 0.7 - 4.0 K/uL   Monocytes Relative 3 %   Monocytes Absolute 0.7 0.1 - 1.0 K/uL    Eosinophils Relative 0 %   Eosinophils Absolute 0.0 0.0 - 0.5 K/uL   Basophils Relative 0 %   Basophils Absolute 0.1 0.0 - 0.1 K/uL   Immature Granulocytes 1 %   Abs Immature Granulocytes 0.28 (H) 0.00 - 0.07 K/uL    Comment: Performed at [facility], [address], [place], [place] [address]  Comprehensive metabolic panel     Status: Abnormal   Collection Time: 01/29/19  1:00 AM  Result Value Ref Range   Sodium 134 (L) 135 - 145 mmol/L   Potassium 5.3 (H) 3.5 - 5.1 mmol/L   Chloride 99 98 - 111 mmol/L   CO2 23 22 - 32 mmol/L   Glucose, Bld 80 70 - 99 mg/dL   BUN 37 (H) 8 - 23 mg/dL   Creatinine, Ser 8.65 0.61 - 1.24 mg/dL   Calcium 9.2 8.9 - 78.4 mg/dL   Total Protein 6.6 6.5 - 8.1 g/dL   Albumin 3.3 (L) 3.5 - 5.0 g/dL   AST 29 15 - 41 U/L   ALT 35 0 - 44 U/L   Alkaline Phosphatase 109 38 - 126 U/L   Total Bilirubin 0.9 0.3 - 1.2 mg/dL   GFR calc non Af Amer >60 >60 mL/min   GFR calc Af Amer >60 >60 mL/min   Anion gap 12 5 - 15    Comment: Performed at [facility], [address] [place], [place] [address]  C-reactive protein     Status: Abnormal   Collection Time: 01/29/19  1:00 AM  Result Value Ref Range   CRP 2.3 (H) <1.0 mg/dL    Comment: Performed at [facility], [address] [place], [place] [address]  D-dimer, quantitative (not at [facility])     Status: Abnormal   Collection Time: 01/29/19  1:00 AM  Result Value Ref Range   D-Dimer, Quant 0.90 (H) 0.00 -  0.50 ug/mL-FEU    Comment: (NOTE) At the manufacturer cut-off of 0.50 ug/mL FEU, this assay has been documented to exclude PE with a sensitivity and negative predictive value of 97 to 99%.  At this time, this assay has not been approved by the FDA to exclude DVT/VTE. Results should be correlated with clinical presentation. Performed at [facility], [address], [place], [place] [address]   Glucose, capillary     Status: None    Collection Time: 01/29/19  7:39 AM  Result Value Ref Range   Glucose-Capillary 83 70 - 99 mg/dL  Glucose, capillary     Status: Abnormal   Collection Time: 01/29/19 11:32 AM  Result Value Ref Range   Glucose-Capillary 186 (H) 70 - 99 mg/dL  Basic metabolic panel     Status: Abnormal   Collection Time: 02/09/19  4:50 PM  Result Value Ref Range   Sodium 129 (L) 135 - 145 mmol/L   Potassium 5.7 (H) 3.5 - 5.1 mmol/L   Chloride 94 (L) 98 - 111 mmol/L   CO2 23 22 - 32 mmol/L   Glucose, Bld 438 (H) 70 - 99 mg/dL   BUN 22 8 - 23 mg/dL   Creatinine, Ser 6.04 0.61 - 1.24 mg/dL   Calcium 8.5 (L) 8.9 - 10.3 mg/dL   GFR calc non Af Amer >60 >60 mL/min   GFR calc Af Amer >60 >60 mL/min   Anion gap 12 5 - 15    Comment: Performed at [facility], [address] [place], [place] [address]  CBC     Status: Abnormal   Collection Time: 02/09/19  4:50 PM  Result Value Ref Range   WBC 16.7 (H) 4.0 - 10.5 K/uL   RBC 3.91 (L) 4.22 - 5.81 MIL/uL   Hemoglobin 12.7 (L) 13.0 - 17.0 g/dL   HCT 11.9 (L) 14.7 - 82.9 %   MCV 95.4 80.0 - 100.0 fL   MCH 32.5 26.0 - 34.0 pg   MCHC 34.0 30.0 - 36.0 g/dL   RDW 56.2 13.0 - 86.5 %   Platelets 275 150 - 400 K/uL   nRBC 0.0 0.0 - 0.2 %    Comment: Performed at [facility], [address], [place], [place] [address]  Troponin I (High Sensitivity)     Status: None   Collection Time: 02/09/19  4:50 PM  Result Value Ref Range   Troponin I (High Sensitivity) 16 <18 ng/L    Comment: (NOTE) Elevated high sensitivity troponin I (hsTnI) values and significant  changes across serial measurements may suggest ACS but many other  chronic and acute conditions are known to elevate hsTnI results.  Refer to the "Links" section for chest pain algorithms and additional  guidance. Performed at [facility], [address] [place], [place] [address]   Novel Coronavirus, NAA ([facility])     Status: None   Collection Time: 03/10/19 10:38 AM    Specimen: Nasopharyngeal(NP) swabs in vial transport medium   NASOPHARYNGE  TESTING  Result Value Ref Range   SARS-CoV-2, NAA Not Detected Not Detected    Comment: This nucleic acid amplification test was developed and its performance characteristics determined by [employer]. Nucleic acid amplification tests include PCR and TMA. This test has not been FDA cleared or approved. This test has been authorized by FDA under an Emergency Use Authorization (EUA). This test is only authorized for the duration of time the declaration that circumstances exist justifying the authorization of  the emergency use of in vitro diagnostic tests for detection of SARS-CoV-2 virus and/or diagnosis of COVID-19 infection under section 564(b)(1) of the Act, 21 U.S.C. 161WRU-0(A) (1), unless the authorization is terminated or revoked sooner. When diagnostic testing is negative, the possibility of a false negative result should be considered in the context of a patient's recent exposures and the presence of clinical signs and symptoms consistent with COVID-19. An individual without symptoms of COVID-19 and who is not shedding SARS-CoV-2 virus would  expect to have a negative (not detected) result in this assay.     Radiology: CT Chest High Resolution  Result Date: 03/17/2019 CLINICAL DATA:  73 year old male with history of COVID-19 infection in October 2020. Shortness of breath and cough since that time. EXAM: CT CHEST WITHOUT CONTRAST TECHNIQUE: Multidetector CT imaging of the chest was performed following the standard protocol without intravenous contrast. High resolution imaging of the lungs, as well as inspiratory and expiratory imaging, was performed. COMPARISON:  No priors. FINDINGS: Cardiovascular: Heart size is normal. There is no significant pericardial fluid, thickening or pericardial calcification. There is aortic atherosclerosis, as well as atherosclerosis of the great vessels of the  mediastinum and the coronary arteries, including calcified atherosclerotic plaque in the left main, left anterior descending, left circumflex and right coronary arteries. Coronary artery stents noted in the left anterior descending, left circumflex and right coronary arteries. Status post median sternotomy for CABG including LIMA to the LAD. Mediastinum/Nodes: No pathologically enlarged mediastinal or hilar lymph nodes. Please note that accurate exclusion of hilar adenopathy is limited on noncontrast CT scans. Esophagus is unremarkable in appearance. No axillary lymphadenopathy. Lungs/Pleura: Patchy areas of ground-glass attenuation, septal thickening, subpleural reticulation, traction bronchiectasis, and peripheral bronchiolectasis in the lungs bilaterally. Findings have a mild craniocaudal gradient. No definitive honeycombing confidently identified. Inspiratory and expiratory imaging is unremarkable. No acute consolidative airspace disease. No pleural effusions. 10 x 4 mm (mean diameter of 7 mm) right middle lobe nodule (axial image 83 of series 3). No other larger more suspicious appearing pulmonary nodules or masses are noted. Upper Abdomen: Aortic atherosclerosis. Musculoskeletal: Median sternotomy wires. There are no aggressive appearing lytic or blastic lesions noted in the visualized portions of the skeleton. IMPRESSION: 1. There are findings in the lungs concerning for interstitial lung disease, with a spectrum of findings considered probable usual interstitial pneumonia (UIP) per current ATS guidelines. 2. 7 mm right middle lobe pulmonary nodule (axial image 83 of series 3). Non-contrast chest CT at 6-12 months is recommended. If the nodule is stable at time of repeat CT, then future CT at 18-24 months (from today's scan) is considered optional for low-risk patients, but is recommended for high-risk patients. This recommendation follows the consensus statement: Guidelines for Management of Incidental  Pulmonary Nodules Detected on CT Images: From the Fleischner Society 2017; Radiology 2017; 284:228-243. 3. Aortic atherosclerosis, in addition to left main and 3 vessel coronary artery disease. Status post median sternotomy for CABG including LIMA to the LAD, as well as PTCI. Aortic Atherosclerosis (ICD10-I70.0). Electronically Signed   By: Trudie Reed M.D.   On: 03/17/2019 16:05    No results found.  CT Chest High Resolution  Result Date: 03/17/2019 CLINICAL DATA:  73 year old male with history of COVID-19 infection in October 2020. Shortness of breath and cough since that time. EXAM: CT CHEST WITHOUT CONTRAST TECHNIQUE: Multidetector CT imaging of the chest was performed following the standard protocol without intravenous contrast. High resolution imaging of the lungs, as well as inspiratory  and expiratory imaging, was performed. COMPARISON:  No priors. FINDINGS: Cardiovascular: Heart size is normal. There is no significant pericardial fluid, thickening or pericardial calcification. There is aortic atherosclerosis, as well as atherosclerosis of the great vessels of the mediastinum and the coronary arteries, including calcified atherosclerotic plaque in the left main, left anterior descending, left circumflex and right coronary arteries. Coronary artery stents noted in the left anterior descending, left circumflex and right coronary arteries. Status post median sternotomy for CABG including LIMA to the LAD. Mediastinum/Nodes: No pathologically enlarged mediastinal or hilar lymph nodes. Please note that accurate exclusion of hilar adenopathy is limited on noncontrast CT scans. Esophagus is unremarkable in appearance. No axillary lymphadenopathy. Lungs/Pleura: Patchy areas of ground-glass attenuation, septal thickening, subpleural reticulation, traction bronchiectasis, and peripheral bronchiolectasis in the lungs bilaterally. Findings have a mild craniocaudal gradient. No definitive honeycombing  confidently identified. Inspiratory and expiratory imaging is unremarkable. No acute consolidative airspace disease. No pleural effusions. 10 x 4 mm (mean diameter of 7 mm) right middle lobe nodule (axial image 83 of series 3). No other larger more suspicious appearing pulmonary nodules or masses are noted. Upper Abdomen: Aortic atherosclerosis. Musculoskeletal: Median sternotomy wires. There are no aggressive appearing lytic or blastic lesions noted in the visualized portions of the skeleton. IMPRESSION: 1. There are findings in the lungs concerning for interstitial lung disease, with a spectrum of findings considered probable usual interstitial pneumonia (UIP) per current ATS guidelines. 2. 7 mm right middle lobe pulmonary nodule (axial image 83 of series 3). Non-contrast chest CT at 6-12 months is recommended. If the nodule is stable at time of repeat CT, then future CT at 18-24 months (from today's scan) is considered optional for low-risk patients, but is recommended for high-risk patients. This recommendation follows the consensus statement: Guidelines for Management of Incidental Pulmonary Nodules Detected on CT Images: From the Fleischner Society 2017; Radiology 2017; 284:228-243. 3. Aortic atherosclerosis, in addition to left main and 3 vessel coronary artery disease. Status post median sternotomy for CABG including LIMA to the LAD, as well as PTCI. Aortic Atherosclerosis (ICD10-I70.0). Electronically Signed   By: Trudie Reed M.D.   On: 03/17/2019 16:05      Assessment and Plan: Patient Active Problem List   Diagnosis Date Noted  . Pneumonia due to COVID-19 virus 01/24/2019  . Prostate cancer screening 03/16/2015  . Coronary artery disease 11/30/2014  . Essential hypertension, benign   . Hyperlipidemia associated with type 2 diabetes mellitus (HCC)   . 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 Diabetic peripheral neuropathy (HCC)     1. Covid 19 pneumonia patient is in  recovery phase I suspect that he probably has some residual damage from the infection.  The patient did have a normal chest x-ray done back in 2016 so it is hard to tell how much of this is new and how much is actually may have occurred in the past 4 years.  Nonetheless he needs to be continued to be monitored and need to continue to encourage use of oxygen. 2. Pulmonary Fibrosis/ILD patient had no acute disease noted on the CT scan high-resolution cuts with ongoing interstitial and cystic changes along with bronchiectasis.  Suspect there may be element of pre-existing lung disease however it is hard to tell with him having a normal chest x-ray back in 2016 3. SOB on symbicort doesn't seem to really help much oxygen does help. Also noted to have restrictive pattern on the PFT we  will need to continue to monitor his PFTs 4. CAD followed by [facility] they are scheduling him for a follow-up on his echocardiogram 5. Hypoxia on oxygen will be continued and I will check 6-minute walk on him today.  General Counseling: I have discussed the findings of the evaluation and examination with Dorene Sorrow.  I have also discussed any further diagnostic evaluation thatmay be needed or ordered today. Laderrick verbalizes understanding of the findings of todays visit. We also reviewed his medications today and discussed drug interactions and side effects including but not limited excessive drowsiness and altered mental states. We also discussed that there is always a risk not just to him but also people around him. he has been encouraged to call the office with any questions or concerns that should arise related to todays visit.  Orders Placed This Encounter  Procedures  . 6 minute walk    Standing Status:   Future    Standing Expiration Date:   03/23/2020    Order Specific Question:   Where should this test be performed?    Answer:   other     Time spent:  I have personally obtained a history, examined the patient,  evaluated laboratory and imaging results, formulated the assessment and plan and placed orders.    Yevonne Pax, MD [facility]monary [facility] Critical [facility] Sleep medicine

## 2019-03-24 NOTE — Patient Instructions (Signed)

## 2019-03-25 DIAGNOSIS — H353121 Nonexudative age-related macular degeneration, left eye, early dry stage: Secondary | ICD-10-CM | POA: Diagnosis not present

## 2019-03-25 DIAGNOSIS — H401131 Primary open-angle glaucoma, bilateral, mild stage: Secondary | ICD-10-CM | POA: Diagnosis not present

## 2019-03-25 LAB — HM DIABETES EYE EXAM

## 2019-03-31 DIAGNOSIS — U071 COVID-19: Secondary | ICD-10-CM | POA: Diagnosis not present

## 2019-04-04 DIAGNOSIS — Z8619 Personal history of other infectious and parasitic diseases: Secondary | ICD-10-CM | POA: Diagnosis not present

## 2019-04-04 DIAGNOSIS — R0600 Dyspnea, unspecified: Secondary | ICD-10-CM | POA: Diagnosis not present

## 2019-04-12 DIAGNOSIS — I5020 Unspecified systolic (congestive) heart failure: Secondary | ICD-10-CM | POA: Insufficient documentation

## 2019-04-12 DIAGNOSIS — I10 Essential (primary) hypertension: Secondary | ICD-10-CM | POA: Diagnosis not present

## 2019-04-12 DIAGNOSIS — I2510 Atherosclerotic heart disease of native coronary artery without angina pectoris: Secondary | ICD-10-CM | POA: Diagnosis not present

## 2019-04-12 DIAGNOSIS — Z8616 Personal history of COVID-19: Secondary | ICD-10-CM | POA: Diagnosis not present

## 2019-04-12 DIAGNOSIS — E782 Mixed hyperlipidemia: Secondary | ICD-10-CM | POA: Diagnosis not present

## 2019-04-12 DIAGNOSIS — E1100 Type 2 diabetes mellitus with hyperosmolarity without nonketotic hyperglycemic-hyperosmolar coma (NKHHC): Secondary | ICD-10-CM | POA: Diagnosis not present

## 2019-04-12 DIAGNOSIS — R0600 Dyspnea, unspecified: Secondary | ICD-10-CM | POA: Diagnosis not present

## 2019-04-13 ENCOUNTER — Telehealth: Payer: Self-pay

## 2019-04-16 ENCOUNTER — Encounter: Payer: Self-pay | Admitting: Nurse Practitioner

## 2019-04-16 DIAGNOSIS — J984 Other disorders of lung: Secondary | ICD-10-CM | POA: Insufficient documentation

## 2019-04-16 DIAGNOSIS — E1159 Type 2 diabetes mellitus with other circulatory complications: Secondary | ICD-10-CM | POA: Insufficient documentation

## 2019-04-18 ENCOUNTER — Other Ambulatory Visit: Payer: Self-pay

## 2019-04-19 ENCOUNTER — Encounter: Payer: Self-pay | Admitting: Nurse Practitioner

## 2019-04-19 ENCOUNTER — Other Ambulatory Visit: Payer: Self-pay

## 2019-04-19 ENCOUNTER — Ambulatory Visit (INDEPENDENT_AMBULATORY_CARE_PROVIDER_SITE_OTHER): Payer: Medicare HMO | Admitting: Nurse Practitioner

## 2019-04-19 VITALS — BP 107/69 | HR 96 | Temp 97.3°F

## 2019-04-19 DIAGNOSIS — E785 Hyperlipidemia, unspecified: Secondary | ICD-10-CM

## 2019-04-19 DIAGNOSIS — I5020 Unspecified systolic (congestive) heart failure: Secondary | ICD-10-CM | POA: Diagnosis not present

## 2019-04-19 DIAGNOSIS — I10 Essential (primary) hypertension: Secondary | ICD-10-CM

## 2019-04-19 DIAGNOSIS — Z8616 Personal history of COVID-19: Secondary | ICD-10-CM | POA: Diagnosis not present

## 2019-04-19 DIAGNOSIS — E1169 Type 2 diabetes mellitus with other specified complication: Secondary | ICD-10-CM | POA: Diagnosis not present

## 2019-04-19 DIAGNOSIS — I152 Hypertension secondary to endocrine disorders: Secondary | ICD-10-CM

## 2019-04-19 DIAGNOSIS — E1159 Type 2 diabetes mellitus with other circulatory complications: Secondary | ICD-10-CM

## 2019-04-19 DIAGNOSIS — J849 Interstitial pulmonary disease, unspecified: Secondary | ICD-10-CM

## 2019-04-19 LAB — BAYER DCA HB A1C WAIVED: HB A1C (BAYER DCA - WAIVED): 7.1 % — ABNORMAL HIGH (ref <7.0)

## 2019-04-19 NOTE — Progress Notes (Signed)
BP 107/69 (BP Location: Left Arm, Patient Position: Sitting, Cuff Size: Normal)   Pulse 96   Temp (!) 97.3 F (36.3 C) (Oral)    Subjective:    Patient ID: Matthew Wolf, male    DOB: [birth year], 74 y.o.   MRN: [id number]  HPI: Matthew Wolf is a 74 y.o. male  Chief Complaint  Patient presents with  . Hyperlipidemia  . Hypertension  . Diabetes  . Congestive Heart Failure   DIABETES October A1C 7.9%.  Continues on Metformin 500 MG three times a day and was started on Jardiance 10 MG by cardiology, stopped Glipizide.  Has been tolerating Metformin without GI issues this time. Hypoglycemic episodes:no Polydipsia/polyuria: no Visual disturbance: no Chest pain: no Paresthesias: no Glucose Monitoring: no             Accucheck frequency: Not Checking             Fasting glucose:             Post prandial:             Evening:             Before meals: Taking Insulin?: no             Long acting insulin:             Short acting insulin: Blood Pressure Monitoring: daily Retinal Examination: goes in August Foot Exam: Up to Date Pneumovax: Up to Date Influenza: Up to Date Aspirin: yes   HYPERTENSION / HYPERLIPIDEMIA/HF Was started on Entresto and Metoprolol XL by cardiology and stopped Ramipril.  Continues on Atorvastatin.  HF is new diagnosis after Covid, he is being followed by cardiology.  Recent EF 25% on 04/04/2019.  Cardiology at [facility] is following and they request BMP today.  He visits with them next in April.  He is weighing himself daily and avoiding high sodium foods. Satisfied with current treatment? yes Duration of hypertension: chronic BP monitoring frequency: daily BP range: 100-110/60-70 BP medication side effects: no Duration of hyperlipidemia: chronic Cholesterol medication side effects: no Cholesterol supplements: none Medication compliance: good compliance Aspirin: yes Recent stressors: no Recurrent headaches: no Visual changes: no Palpitations:  no Dyspnea: yes, baseline at this time Chest pain: no Lower extremity edema: no Dizzy/lightheaded: no  COVID 19 FOLLOW-UP: Covid positive testing initially on 01/14/2019. He was admitted to [facility] on 01/24/2019 after worsening symptoms presented and discharged on 01/29/2019. He reports his SOB is improving,  using O2 plus inhaler as needed -- has been using only one time a day.  Continues to use Tessalon and Tussin.  Reports nonproductive cough.  Using incentive spirometry 4-5 times a day.  On recent return to ER for visit CXR did note "Significant interval increase in the widespread areas of interstitial airspace opacity throughout the lungs, compatible with a multifocal pneumonia", ongoing changes with Covid.    He is currently being followed by pulmonary, Dr. Chancy Milroy and is dependent on O2.  He is checking O2 sats at home and majority of time staying above 90%, is on 2L of [place].  He is back at work and staying busy, working 5 hours.  Continues to have SOB walking, even short distances.    Relevant past medical, surgical, family and social history reviewed and updated as indicated. Interim medical history since our last visit reviewed. Allergies and medications reviewed and updated.  Review of Systems  Constitutional: Positive for fatigue. Negative for activity change, diaphoresis  and fever.  Respiratory: Positive for cough (intermittent) and shortness of breath (at baseline). Negative for chest tightness and wheezing.   Cardiovascular: Negative for chest pain, palpitations and leg swelling.  Endocrine: Negative for cold intolerance, heat intolerance, polydipsia, polyphagia and polyuria.  Neurological: Negative for dizziness, syncope, weakness, light-headedness, numbness and headaches.  Psychiatric/Behavioral: Negative.     Per HPI unless specifically indicated above     Objective:    BP 107/69 (BP Location: Left Arm, Patient Position: Sitting, Cuff Size: Normal)   Pulse 96   Temp  (!) 97.3 F (36.3 C) (Oral)   Wt Readings from Last 3 Encounters:  03/24/19 135 lb (61.2 kg)  03/07/19 130 lb (59 kg)  02/16/19 130 lb (59 kg)    Physical Exam Vitals and nursing note reviewed.  Constitutional:      General: He is awake. He is not in acute distress.    Appearance: He is well-developed. He is not ill-appearing.  HENT:     Head: Normocephalic and atraumatic.     Right Ear: Hearing normal. No drainage.     Left Ear: Hearing normal. No drainage.  Eyes:     General: Lids are normal.        Right eye: No discharge.        Left eye: No discharge.     Conjunctiva/sclera: Conjunctivae normal.     Pupils: Pupils are equal, round, and reactive to light.  Neck:     Vascular: No carotid bruit or JVD.  Cardiovascular:     Rate and Rhythm: Normal rate and regular rhythm.     Heart sounds: Normal heart sounds, S1 normal and S2 normal. No murmur. No gallop.   Pulmonary:     Effort: Pulmonary effort is normal. No accessory muscle usage or respiratory distress.     Breath sounds: Normal breath sounds.  Abdominal:     General: Bowel sounds are normal.     Palpations: Abdomen is soft.  Musculoskeletal:        General: Normal range of motion.     Cervical back: Normal range of motion and neck supple.     Right lower leg: No edema.     Left lower leg: No edema.  Skin:    General: Skin is warm and dry.     Capillary Refill: Capillary refill takes less than 2 seconds.  Neurological:     Mental Status: He is alert and oriented to person, place, and time.  Psychiatric:        Mood and Affect: Mood normal.        Behavior: Behavior normal. Behavior is cooperative.        Thought Content: Thought content normal.        Judgment: Judgment normal.    Results for orders placed or performed in visit on 04/19/19  Bayer DCA Hb A1c Waived  Result Value Ref Range   HB A1C (BAYER DCA - WAIVED) 7.1 (H) <7.0 %      Assessment & Plan:   Problem List Items Addressed This Visit       Cardiovascular and Mediastinum   Hypertension associated with diabetes (HCC)    Chronic, ongoing.  BP below goal today.  Continue current medication regimen and collaboration with cardiology.  Continue to monitor BP daily at home.  Obtain BMP today.  A1C improved at 7.1%, will increase Jardiance to 25 MG for cardio protection.      Relevant Medications   metoprolol succinate (TOPROL-XL) 50  MG 24 hr tablet   sacubitril-valsartan (ENTRESTO) 24-26 MG   empagliflozin (JARDIANCE) 10 MG TABS tablet   Other Relevant Orders   Basic Metabolic Panel (BMET)   Bayer DCA Hb A1c Waived (Completed)   HFrEF (heart failure with reduced ejection fraction) (HCC)    New diagnosis since Covid.  Continue collaboration with cardiology at [facility], will obtain BMP for them today.  BP at goal today.  A1C improved at 7.1%, will increase Jardiance to 25 MG for cardio protection. Recommend: - Reminded to call for an overnight weight gain of >2 pounds or a weekly weight weight of >5 pounds - not adding salt to his food and has been reading food labels. Reviewed the importance of keeping daily sodium intake to 2000mg  daily       Relevant Medications   metoprolol succinate (TOPROL-XL) 50 MG 24 hr tablet   sacubitril-valsartan (ENTRESTO) 24-26 MG   Other Relevant Orders   Basic Metabolic Panel (BMET)   Type 2 diabetes mellitus with cardiac complication (HCC) - Primary    Chronic, ongoing with new diagnosis of HF since Covid.  Continue collaboration with cardiology and current medication regimen.  A1C today is 7.1%, will talk to patient and increase Jardiance to 25 MG (is tolerating 10 MG dosing).  Continue Metformin.  BMP today.  Return in 3 months.      Relevant Medications   metoprolol succinate (TOPROL-XL) 50 MG 24 hr tablet   sacubitril-valsartan (ENTRESTO) 24-26 MG   empagliflozin (JARDIANCE) 10 MG TABS tablet   Other Relevant Orders   Bayer DCA Hb A1c Waived (Completed)     Respiratory   Interstitial pulmonary  disease (HCC)    Chronic, ongoing issue since Covid.  O2 dependent at this time.  Followed by pulmonary.  No current inhalers.  Continue to collaborate with pulmonary and home O2 therapy.        Endocrine   Hyperlipidemia associated with type 2 diabetes mellitus (HCC)    Chronic, ongoing.  Continue current medication regimen and adjust as needed.  Lipid panel today.      Relevant Medications   empagliflozin (JARDIANCE) 10 MG TABS tablet   Other Relevant Orders   Bayer DCA Hb A1c Waived (Completed)   Lipid Panel w/o Chol/HDL Ratio out     Other   History of 2019 novel coronavirus disease (COVID-19)    Continue to collaborate with pulmonary and cardiology due to new diagnosis of HF and interstitial lung disease since contracting the virus.  Check CBC today.      Relevant Orders   CBC with Differential/Platelet out       Follow up plan: Return in about 3 months (around 07/18/2019) for T2DM, HTNlHLD, HF.

## 2019-04-19 NOTE — Assessment & Plan Note (Signed)
Chronic, ongoing.  Continue current medication regimen and adjust as needed. Lipid panel today. 

## 2019-04-19 NOTE — Assessment & Plan Note (Signed)
Chronic, ongoing issue since Covid.  O2 dependent at this time.  Followed by pulmonary.  No current inhalers.  Continue to collaborate with pulmonary and home O2 therapy.

## 2019-04-19 NOTE — Assessment & Plan Note (Signed)
Chronic, ongoing.  BP below goal today.  Continue current medication regimen and collaboration with cardiology.  Continue to monitor BP daily at home.  Obtain BMP today.  A1C improved at 7.1%, will increase Jardiance to 25 MG for cardio protection.

## 2019-04-19 NOTE — Assessment & Plan Note (Signed)
Chronic, ongoing with new diagnosis of HF since Covid.  Continue collaboration with cardiology and current medication regimen.  A1C today is 7.1%, will talk to patient and increase Jardiance to 25 MG (is tolerating 10 MG dosing).  Continue Metformin.  BMP today.  Return in 3 months.

## 2019-04-19 NOTE — Assessment & Plan Note (Addendum)
Continue to collaborate with pulmonary and cardiology due to new diagnosis of HF and interstitial lung disease since contracting the virus.  Check CBC today.

## 2019-04-19 NOTE — Patient Instructions (Signed)
Heart Failure Action Plan A heart failure action plan helps you understand what to do when you have symptoms of heart failure. Follow the plan that was created by you and your health care provider. Review your plan each time you visit your health care provider. Red zone These signs and symptoms mean you should get medical help right away:  You have trouble breathing when resting.  You have a dry cough that is getting worse.  You have swelling or pain in your legs or abdomen that is getting worse.  You suddenly gain more than 2-3 lb (0.9-1.4 kg) in a day, or more than 5 lb (2.3 kg) in one week. This amount may be more or less depending on your condition.  You have trouble staying awake or you feel confused.  You have chest pain.  You do not have an appetite.  You pass out. If you experience any of these symptoms:  Call your local emergency services (911 in the [place] right away or seek help at the emergency department of the nearest hospital. Yellow zone These signs and symptoms mean your condition may be getting worse and you should make some changes:  You have trouble breathing when you are active or you need to sleep with extra pillows.  You have swelling in your legs or abdomen.  You gain 2-3 lb (0.9-1.4 kg) in one day, or 5 lb (2.3 kg) in one week. This amount may be more or less depending on your condition.  You get tired easily.  You have trouble sleeping.  You have a dry cough. If you experience any of these symptoms:  Contact your health care provider within the next day.  Your health care provider may adjust your medicines. Green zone These signs mean you are doing well and can continue what you are doing:  You do not have shortness of breath.  You have very little swelling or no new swelling.  Your weight is stable (no gain or loss).  You have a normal activity level.  You do not have chest pain or any other new symptoms. Follow these instructions at  home:  Take over-the-counter and prescription medicines only as told by your health care provider.  Weigh yourself daily. Your target weight is __________ lb (__________ kg). ? Call your health care provider if you gain more than __________ lb (__________ kg) in a day, or more than __________ lb (__________ kg) in one week.  Eat a heart-healthy diet. Work with a diet and nutrition specialist (dietitian) to create an eating plan that is best for you.  Keep all follow-up visits as told by your health care provider. This is important. Where to find more information  American Heart Association: www.heart.org Summary  Follow the action plan that was created by you and your health care provider.  Get help right away if you have any symptoms in the Red zone. This information is not intended to replace advice given to you by your health care provider. Make sure you discuss any questions you have with your health care provider. Document Revised: 03/06/2017 Document Reviewed: 05/03/2016 Elsevier Patient Education  2020 Elsevier Inc.  

## 2019-04-19 NOTE — Assessment & Plan Note (Signed)
New diagnosis since Covid.  Continue collaboration with cardiology at [facility], will obtain BMP for them today.  BP at goal today.  A1C improved at 7.1%, will increase Jardiance to 25 MG for cardio protection. Recommend: - Reminded to call for an overnight weight gain of >2 pounds or a weekly weight weight of >5 pounds - not adding salt to his food and has been reading food labels. Reviewed the importance of keeping daily sodium intake to 2000mg  daily

## 2019-04-20 LAB — BASIC METABOLIC PANEL
BUN/Creatinine Ratio: 17 (ref 10–24)
BUN: 18 mg/dL (ref 8–27)
CO2: 23 mmol/L (ref 20–29)
Calcium: 9.8 mg/dL (ref 8.6–10.2)
Chloride: 96 mmol/L (ref 96–106)
Creatinine, Ser: 1.04 mg/dL (ref 0.76–1.27)
GFR calc Af Amer: 81 mL/min/{1.73_m2} (ref >59)
GFR calc non Af Amer: 70 mL/min/{1.73_m2} (ref >59)
Glucose: 190 mg/dL — ABNORMAL HIGH (ref 65–99)
Potassium: 5.9 mmol/L — ABNORMAL HIGH (ref 3.5–5.2)
Sodium: 136 mmol/L (ref 134–144)

## 2019-04-20 LAB — CBC WITH DIFFERENTIAL/PLATELET
Basophils Absolute: 0.1 10*3/uL (ref 0.0–0.2)
Basos: 1 %
EOS (ABSOLUTE): 0.2 10*3/uL (ref 0.0–0.4)
Eos: 1 %
Hematocrit: 42.3 % (ref 37.5–51.0)
Hemoglobin: 14.4 g/dL (ref 13.0–17.7)
Immature Grans (Abs): 0.1 10*3/uL (ref 0.0–0.1)
Immature Granulocytes: 1 %
Lymphocytes Absolute: 2.7 10*3/uL (ref 0.7–3.1)
Lymphs: 23 %
MCH: 32.4 pg (ref 26.6–33.0)
MCHC: 34 g/dL (ref 31.5–35.7)
MCV: 95 fL (ref 79–97)
Monocytes Absolute: 0.8 10*3/uL (ref 0.1–0.9)
Monocytes: 7 %
Neutrophils Absolute: 8.3 10*3/uL — ABNORMAL HIGH (ref 1.4–7.0)
Neutrophils: 67 %
Platelets: 350 10*3/uL (ref 150–450)
RBC: 4.44 x10E6/uL (ref 4.14–5.80)
RDW: 12.9 % (ref 11.6–15.4)
WBC: 12.1 10*3/uL — ABNORMAL HIGH (ref 3.4–10.8)

## 2019-04-20 LAB — LIPID PANEL W/O CHOL/HDL RATIO
Cholesterol, Total: 119 mg/dL (ref 100–199)
HDL: 29 mg/dL — ABNORMAL LOW (ref >39)
LDL Chol Calc (NIH): 64 mg/dL (ref 0–99)
Triglycerides: 145 mg/dL (ref 0–149)
VLDL Cholesterol Cal: 26 mg/dL (ref 5–40)

## 2019-04-20 NOTE — Progress Notes (Signed)
Please fax BMP results to Dr. Lars Pinks at [phone].  I have contacted patient via MyChart with results, but these Dr. Lars Pinks wanted.  Thank you.

## 2019-04-27 ENCOUNTER — Other Ambulatory Visit: Payer: Self-pay | Admitting: Nurse Practitioner

## 2019-04-27 ENCOUNTER — Ambulatory Visit (INDEPENDENT_AMBULATORY_CARE_PROVIDER_SITE_OTHER): Payer: Medicare HMO | Admitting: Pharmacist

## 2019-04-27 DIAGNOSIS — E1159 Type 2 diabetes mellitus with other circulatory complications: Secondary | ICD-10-CM

## 2019-04-27 DIAGNOSIS — I5020 Unspecified systolic (congestive) heart failure: Secondary | ICD-10-CM

## 2019-04-27 DIAGNOSIS — E785 Hyperlipidemia, unspecified: Secondary | ICD-10-CM | POA: Diagnosis not present

## 2019-04-27 DIAGNOSIS — E1169 Type 2 diabetes mellitus with other specified complication: Secondary | ICD-10-CM | POA: Diagnosis not present

## 2019-04-27 MED ORDER — JARDIANCE 25 MG PO TABS: 25.0000 mg | ORAL_TABLET | Freq: Every day | ORAL | 3 refills | Status: DC

## 2019-04-27 NOTE — Patient Instructions (Signed)
Visit Information  Goals Addressed            This Visit's Progress     Patient Stated   . PharmD "I want to get back to being healthy" (pt-stated)       Current Barriers:  . Polypharmacy; complex patient with multiple comorbidities including T2DM, CAD (CABG in 1991, MI in 1996, 2 stents in 2002); HFrEF (EF 25-30%); s/p COVID pneumonia; admitted at [facility] 01/24/2019-01/29/2019,  o HFrEF: New onset since COVID dx; follows w/ Dr. Lars Pinks at [facility]. Entresto 24/26 mg BID, metoprolol succinate 50 mg daily o CAD/ASCVD risk reduction: ASA 81 mg daily, atorvastatin 80 mg daily; last LDL at goal <70 o T2DM: metformin 500 mg BID, Jardiance 10 mg daily; last A1c 7.1%; per [facility], he was to increase to 25 mg daily, but 25 mg daily dose was not ordered o S/p COVID: Follows w/ pulmonology Dr. Welton Flakes. Continues on Symbicort 160/4.5 BID, albuterol PRN, though notes he isn't using albuterol lately.  - Continues using Diabetic tussin. Notes the cough continues to be the most aggravating part of his diagnosis  Pharmacist Clinical Goal(s):  [name] Over the next 90 days, patient will work with PharmD and provider towards optimized medication management  Interventions: . Comprehensive medication review performed; medication list updated in electronic medical record . Reviewed copays. Patient reports being able to afford $47 copay for Valla Leaver, and Symbicort at this time. Reviewed availability of patient assistance programs. He would qualify for [facility] assistance, but not for Jardiance or Symbicort due to income. He notes he would like to wait until after f/u with pulmonary in Feb and cardiology in April before pursuing patient assistance.  . Will collaborate w/ PCP regarding Jardiance 25 mg prescription as above . Encouraged continued hydration and use of OTC cough suppressant medications  Patient Self Care Activities:  . Patient will take medications as prescribed  Please see past updates related to  this goal by clicking on the "Past Updates" button in the selected goal         The patient verbalized understanding of instructions provided today and declined a print copy of patient instruction materials.      Plan:  - Scheduled f/u call 06/28/19  Catie Feliz Beam, PharmD, [facility] Clinical Pharmacist [facility] [phone]

## 2019-04-27 NOTE — Chronic Care Management (AMB) (Signed)
Chronic Care Management   Follow Up Note   04/27/2019 Name: Matthew Wolf MRN: [id number] DOB: 04/03/1946  Referred by: Marjie Skiff, NP Reason for referral : Chronic Care Management (Medication Management)   Matthew Wolf is a 74 y.o. year old male who is a primary care patient of Cannady, Dorie Rank, NP. The CCM team was consulted for assistance with chronic disease management and care coordination needs.    Contacted patient for medication management review.   Review of patient status, including review of consultants reports, relevant laboratory and other test results, and collaboration with appropriate care team members and the patient's provider was performed as part of comprehensive patient evaluation and provision of chronic care management services.    SDOH (Social Determinants of Health) screening performed today: Financial Strain  Stress. See Care Plan for related entries.   Outpatient Encounter Medications as of 04/27/2019  Medication Sig  . albuterol (VENTOLIN HFA) 108 (90 Base) MCG/ACT inhaler Inhale 2 puffs into the lungs every 6 (six) hours as needed for wheezing or shortness of breath.  [name] aspirin EC 81 MG tablet Take 81 mg by mouth daily.   [name] atorvastatin (LIPITOR) 80 MG tablet Take 80 mg by mouth at bedtime.   . budesonide-formoterol (SYMBICORT) 160-4.5 MCG/ACT inhaler Inhale 2 puffs into the lungs 2 (two) times daily.  . empagliflozin (JARDIANCE) 10 MG TABS tablet Take 10 mg by mouth daily.  . metFORMIN (GLUCOPHAGE) 500 MG tablet Take 1 tablet (500 mg total) by mouth 2 (two) times daily with a meal. (Patient taking differently: Take 500 mg by mouth 2 (two) times daily with a meal. Pt states he takes 500mg  @ 1200, 1000mg  @ 1800)  . metoprolol succinate (TOPROL-XL) 50 MG 24 hr tablet Take 50 mg by mouth Nightly. Take at bedtime.  . nortriptyline (PAMELOR) 25 MG capsule TAKE 1 CAPSULE (25 MG TOTAL) BY MOUTH 2 (TWO) TIMES DAILY.  . sacubitril-valsartan (ENTRESTO) 24-26  MG Take 1 tablet by mouth 2 (two) times daily.  . benzonatate (TESSALON) 200 MG capsule Take 1 capsule (200 mg total) by mouth 2 (two) times daily as needed for cough. (Patient not taking: Reported on 04/27/2019)  . naproxen sodium (ALEVE) 220 MG tablet Take 220 mg by mouth 2 (two) times daily as needed (pain).  . Omega-3 Fatty Acids (FISH OIL PO) Take 1 capsule by mouth daily.    No facility-administered encounter medications on file as of 04/27/2019.     Objective:   Goals Addressed            This Visit's Progress     Patient Stated   . PharmD "I want to get back to being healthy" (pt-stated)       Current Barriers:  . Polypharmacy; complex patient with multiple comorbidities including T2DM, CAD (CABG in 1991, MI in 1996, 2 stents in 2002); HFrEF (EF 25-30%); s/p COVID pneumonia; admitted at [facility] 01/24/2019-01/29/2019,  o HFrEF: New onset since COVID dx; follows w/ Dr. 01/26/2019 at [facility]. Entresto 24/26 mg BID, metoprolol succinate 50 mg daily o CAD/ASCVD risk reduction: ASA 81 mg daily, atorvastatin 80 mg daily; last LDL at goal <70 o T2DM: metformin 500 mg BID, Jardiance 10 mg daily; last A1c 7.1%; per [facility], he was to increase to 25 mg daily, but 25 mg daily dose was not ordered o S/p COVID: Follows w/ pulmonology [facility]. Continues on Symbicort 160/4.5 BID, albuterol PRN, though notes he isn't using albuterol lately.  - Continues using  Diabetic tussin. Notes the cough continues to be the most aggravating part of his diagnosis  Pharmacist Clinical Goal(s):  [name] Over the next 90 days, patient will work with PharmD and provider towards optimized medication management  Interventions: . Comprehensive medication review performed; medication list updated in electronic medical record . Reviewed copays. Patient reports being able to afford $47 copay for Ferne Coe, and Symbicort at this time. Reviewed availability of patient assistance programs. He would qualify for [facility]  assistance, but not for Jardiance or Symbicort due to income. He notes he would like to wait until after f/u with pulmonary in Feb and cardiology in April before pursuing patient assistance.  . Will collaborate w/ PCP regarding Jardiance 25 mg prescription as above . Encouraged continued hydration and use of OTC cough suppressant medications  Patient Self Care Activities:  . Patient will take medications as prescribed  Please see past updates related to this goal by clicking on the "Past Updates" button in the selected goal          Plan:  - Scheduled f/u call 06/28/19  Catie Darnelle Maffucci, PharmD, [place] [phone]

## 2019-05-01 DIAGNOSIS — U071 COVID-19: Secondary | ICD-10-CM | POA: Diagnosis not present

## 2019-05-09 ENCOUNTER — Telehealth: Payer: Self-pay

## 2019-05-09 NOTE — Telephone Encounter (Signed)
Is it ok for this to be written up? Copied from CRM [id number]. Topic: General - Inquiry >> May 09, 2019  3:33 PM Floria Raveling A wrote: Reason for CRM: pt wife called in an stated that pt is wanted to know if he could get one of the potable oxygen tanks that you carry. She would like to know if the order could be sent to [facility].  Phone number is [phone] She did not have a fax number . Best number for wife is [phone]

## 2019-05-09 NOTE — Telephone Encounter (Signed)
Routing to clinical box 

## 2019-05-09 NOTE — Telephone Encounter (Signed)
I suspect he will need visit for this as they require specific testing.  I would advise him to reach out to his lung doctor for request, as they may have testing on hand that Matthew Wolf will require for portable tank.

## 2019-05-10 NOTE — Telephone Encounter (Signed)
Patients wife notified

## 2019-05-20 ENCOUNTER — Telehealth: Payer: Self-pay

## 2019-05-30 ENCOUNTER — Other Ambulatory Visit: Payer: Self-pay | Admitting: Nurse Practitioner

## 2019-06-01 ENCOUNTER — Ambulatory Visit (INDEPENDENT_AMBULATORY_CARE_PROVIDER_SITE_OTHER): Payer: Medicare HMO | Admitting: Nurse Practitioner

## 2019-06-01 ENCOUNTER — Other Ambulatory Visit: Payer: Self-pay

## 2019-06-01 ENCOUNTER — Encounter: Payer: Self-pay | Admitting: Nurse Practitioner

## 2019-06-01 VITALS — BP 100/67 | HR 86 | Temp 97.3°F | Wt 131.4 lb

## 2019-06-01 DIAGNOSIS — M79675 Pain in left toe(s): Secondary | ICD-10-CM

## 2019-06-01 DIAGNOSIS — S90811A Abrasion, right foot, initial encounter: Secondary | ICD-10-CM

## 2019-06-01 DIAGNOSIS — U071 COVID-19: Secondary | ICD-10-CM | POA: Diagnosis not present

## 2019-06-01 MED ORDER — MUPIROCIN 2 % EX OINT: 1.0000 "application " | TOPICAL_OINTMENT | Freq: Two times a day (BID) | CUTANEOUS | 0 refills | Status: DC

## 2019-06-01 MED ORDER — METFORMIN HCL 500 MG PO TABS: ORAL_TABLET | ORAL | 3 refills | Status: AC

## 2019-06-01 NOTE — Assessment & Plan Note (Signed)
Healing at this time.  Will send script for Mupirocin to apply over area.  Recommended to continue treatment at home and watch for signs of infection such as erythema, tenderness, pain, or drainage.  If present to return to office immediately.  Discussed importance of skin monitoring with his neuropathy and offered referral to podiatry, he does not wish to pursue this at this time.

## 2019-06-01 NOTE — Progress Notes (Signed)
BP 100/67 (BP Location: Left Arm, Patient Position: Sitting, Cuff Size: Normal)   Pulse 86 Comment: apical  Temp (!) 97.3 F (36.3 C) (Oral)   Wt 131 lb 6.4 oz (59.6 kg)   SpO2 97%   BMI 20.58 kg/m    Subjective:    Patient ID: URIAS SHEEK, male    DOB: 02-May-1945, 74 y.o.   MRN: [id number]  HPI: Matthew Wolf is a 74 y.o. male  Chief Complaint  Patient presents with  . Foot Swelling    Foot is warm when swelling   FOOT SWELLING Has sore spot on bottom of right foot, which he has been "doctoring".  On Sunday sometime after lunch his left foot swelled up and was a little warm, right foot was cold. Right foot has not swollen.  Had some pain in his great left toe on Sunday, but no pain anywhere else.  At the time could not touch toe the pain was so bad, they placed ointment on it and pain has improved.  No history of gout flares.  When props feet up swelling is down next morning.  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  Sees cardiology next April 6th, for echo and appointment -- [facility].  States at this time swelling and pain to left foot has improved.  Current CrCl 52.  Denies any dietary indiscretions.   Status: better Treatments attempted: peroxide and abx ointment  Relief with NSAIDs?:  Aleeve -- took this morning Weakness with weight bearing or walking: no Morning stiffness: no Swelling: yes on Sunday, but has improved Redness: yes on Sunday, but has improved Bruising: no Paresthesias / decreased sensation: no  Fevers:no  Relevant past medical, surgical, family and social history reviewed and updated as indicated. Interim medical history since our last visit reviewed. Allergies and medications reviewed and updated.  Review of Systems  Constitutional: Negative for activity change, diaphoresis, fatigue and fever.  Respiratory: Negative for cough, chest tightness, shortness of breath and wheezing.   Cardiovascular: Positive for  leg swelling (left foot only, now improved). Negative for chest pain and palpitations.  Musculoskeletal: Positive for joint swelling.  Skin: Negative.   Neurological: Negative.   Psychiatric/Behavioral: Negative.     Per HPI unless specifically indicated above     Objective:    BP 100/67 (BP Location: Left Arm, Patient Position: Sitting, Cuff Size: Normal)   Pulse 86 Comment: apical  Temp (!) 97.3 F (36.3 C) (Oral)   Wt 131 lb 6.4 oz (59.6 kg)   SpO2 97%   BMI 20.58 kg/m   Wt Readings from Last 3 Encounters:  06/01/19 131 lb 6.4 oz (59.6 kg)  03/24/19 135 lb (61.2 kg)  03/07/19 130 lb (59 kg)    Physical Exam Vitals and nursing note reviewed.  Constitutional:      General: He is awake. He is not in acute distress.    Appearance: He is well-developed and well-groomed. He is not ill-appearing.  HENT:     Head: Normocephalic and atraumatic.     Right Ear: Hearing normal. No drainage.     Left Ear: Hearing normal. No drainage.  Eyes:     General: Lids are normal.        Right eye: No discharge.        Left eye: No discharge.     Conjunctiva/sclera: Conjunctivae normal.     Pupils: Pupils are equal, round, and reactive to light.  Neck:     Vascular: No carotid bruit.  Cardiovascular:     Rate and Rhythm: Normal rate and regular rhythm.     Pulses:          Dorsalis pedis pulses are 1+ on the right side and 1+ on the left side.       Posterior tibial pulses are 1+ on the right side and 1+ on the left side.     Heart sounds: Normal heart sounds, S1 normal and S2 normal. No murmur. No gallop.   Pulmonary:     Effort: Pulmonary effort is normal. No accessory muscle usage or respiratory distress.     Breath sounds: Normal breath sounds.  Abdominal:     General: Bowel sounds are normal.     Palpations: Abdomen is soft.  Musculoskeletal:        General: Normal range of motion.     Cervical back: Normal range of motion and neck supple.     Right lower leg: No edema.      Left lower leg: No edema.     Right foot: Normal range of motion.     Left foot: Normal range of motion.       Feet:  Feet:     Right foot:     Protective Sensation: 10 sites tested. 4 sites sensed.     Skin integrity: Skin breakdown present. No erythema or warmth.     Toenail Condition: Right toenails are normal.     Left foot:     Protective Sensation: 10 sites tested. 5 sites sensed.     Skin integrity: Dry skin present. No ulcer, erythema or warmth.     Toenail Condition: Left toenails are normal.     Comments: Left great toe with no tenderness, erythema, swelling, or warmth.  Full ROM present.  No edema bilateral lower extremity. Skin:    General: Skin is warm and dry.     Capillary Refill: Capillary refill takes less than 2 seconds.  Neurological:     Mental Status: He is alert and oriented to person, place, and time.     Deep Tendon Reflexes: Reflexes are normal and symmetric.     Reflex Scores:      Brachioradialis reflexes are 2+ on the right side and 2+ on the left side.      Patellar reflexes are 2+ on the right side and 2+ on the left side. Psychiatric:        Mood and Affect: Mood normal.        Speech: Speech normal.        Behavior: Behavior normal. Behavior is cooperative.        Thought Content: Thought content normal.    Diabetic Foot Exam - Simple   Simple Foot Form Visual Inspection No deformities, no ulcerations, no other skin breakdown bilaterally: Yes Sensation Testing See comments: Yes Pulse Check See comments: Yes Comments Bilateral pulses PT and DP == 1+.  Sensation left foot 5/10 and right foot 4/10.     Results for orders placed or performed in visit on 04/19/19  Basic Metabolic Panel (BMET)  Result Value Ref Range   Glucose 190 (H) 65 - 99 mg/dL   BUN 18 8 - 27 mg/dL   Creatinine, Ser 2.35 0.76 - 1.27 mg/dL   GFR calc non Af Amer 70 >59 mL/min/1.73   GFR calc Af Amer 81 >59 mL/min/1.73   BUN/Creatinine Ratio 17 10 - 24   Sodium 136 134  -  144 mmol/L   Potassium 5.9 (H) 3.5 - 5.2 mmol/L   Chloride 96 96 - 106 mmol/L   CO2 23 20 - 29 mmol/L   Calcium 9.8 8.6 - 10.2 mg/dL  Bayer DCA Hb A1c Waived  Result Value Ref Range   HB A1C (BAYER DCA - WAIVED) 7.1 (H) <7.0 %  Lipid Panel w/o Chol/HDL Ratio out  Result Value Ref Range   Cholesterol, Total 119 100 - 199 mg/dL   Triglycerides 145 0 - 149 mg/dL   HDL 29 (L) >39 mg/dL   VLDL Cholesterol Cal 26 5 - 40 mg/dL   LDL Chol Calc (NIH) 64 0 - 99 mg/dL  CBC with Differential/Platelet out  Result Value Ref Range   WBC 12.1 (H) 3.4 - 10.8 x10E3/uL   RBC 4.44 4.14 - 5.80 x10E6/uL   Hemoglobin 14.4 13.0 - 17.7 g/dL   Hematocrit 42.3 37.5 - 51.0 %   MCV 95 79 - 97 fL   MCH 32.4 26.6 - 33.0 pg   MCHC 34.0 31.5 - 35.7 g/dL   RDW 12.9 11.6 - 15.4 %   Platelets 350 150 - 450 x10E3/uL   Neutrophils 67 Not Estab. %   Lymphs 23 Not Estab. %   Monocytes 7 Not Estab. %   Eos 1 Not Estab. %   Basos 1 Not Estab. %   Neutrophils Absolute 8.3 (H) 1.4 - 7.0 x10E3/uL   Lymphocytes Absolute 2.7 0.7 - 3.1 x10E3/uL   Monocytes Absolute 0.8 0.1 - 0.9 x10E3/uL   EOS (ABSOLUTE) 0.2 0.0 - 0.4 x10E3/uL   Basophils Absolute 0.1 0.0 - 0.2 x10E3/uL   Immature Granulocytes 1 Not Estab. %   Immature Grans (Abs) 0.1 0.0 - 0.1 x10E3/uL      Assessment & Plan:   Problem List Items Addressed This Visit      Musculoskeletal and Integument   Abrasion of right heel    Healing at this time.  Will send script for Mupirocin to apply over area.  Recommended to continue treatment at home and watch for signs of infection such as erythema, tenderness, pain, or drainage.  If present to return to office immediately.  Discussed importance of skin monitoring with his neuropathy and offered referral to podiatry, he does not wish to pursue this at this time.          Other   Pain of left great toe - Primary    Left great toe.  Improved at this time with no edema, tenderness, warmth, or erythema.  Full ROM  present to both feet.  Suspect a gout flare presented.  Will obtain labs today to include uric acid and CMP.  Educated on diet choices to avoid flare.  If return of symptoms to notify provider immediately, would treat.  Could consider short burst of Prednisone.        Relevant Orders   Comprehensive metabolic panel   Uric acid       Follow up plan: Return if symptoms worsen or fail to improve.

## 2019-06-01 NOTE — Patient Instructions (Signed)

## 2019-06-01 NOTE — Assessment & Plan Note (Signed)
Left great toe.  Improved at this time with no edema, tenderness, warmth, or erythema.  Full ROM present to both feet.  Suspect a gout flare presented.  Will obtain labs today to include uric acid and CMP.  Educated on diet choices to avoid flare.  If return of symptoms to notify provider immediately, would treat.  Could consider short burst of Prednisone.

## 2019-06-02 LAB — COMPREHENSIVE METABOLIC PANEL
ALT: 17 IU/L (ref 0–44)
AST: 18 IU/L (ref 0–40)
Albumin/Globulin Ratio: 2.1 (ref 1.2–2.2)
Albumin: 4.7 g/dL (ref 3.7–4.7)
Alkaline Phosphatase: 119 IU/L — ABNORMAL HIGH (ref 39–117)
BUN/Creatinine Ratio: 18 (ref 10–24)
BUN: 18 mg/dL (ref 8–27)
Bilirubin Total: 0.4 mg/dL (ref 0.0–1.2)
CO2: 24 mmol/L (ref 20–29)
Calcium: 9.5 mg/dL (ref 8.6–10.2)
Chloride: 99 mmol/L (ref 96–106)
Creatinine, Ser: 1.02 mg/dL (ref 0.76–1.27)
GFR calc Af Amer: 83 mL/min/{1.73_m2} (ref >59)
GFR calc non Af Amer: 72 mL/min/{1.73_m2} (ref >59)
Globulin, Total: 2.2 g/dL (ref 1.5–4.5)
Glucose: 160 mg/dL — ABNORMAL HIGH (ref 65–99)
Potassium: 5.3 mmol/L — ABNORMAL HIGH (ref 3.5–5.2)
Sodium: 140 mmol/L (ref 134–144)
Total Protein: 6.9 g/dL (ref 6.0–8.5)

## 2019-06-02 LAB — URIC ACID: Uric Acid: 4.3 mg/dL (ref 3.8–8.4)

## 2019-06-02 NOTE — Progress Notes (Signed)
Contacted via MyChart

## 2019-06-16 ENCOUNTER — Encounter: Payer: Self-pay | Admitting: Podiatry

## 2019-06-16 ENCOUNTER — Other Ambulatory Visit: Payer: Self-pay | Admitting: Podiatry

## 2019-06-16 ENCOUNTER — Ambulatory Visit: Payer: Medicare HMO | Admitting: Podiatry

## 2019-06-16 ENCOUNTER — Ambulatory Visit (INDEPENDENT_AMBULATORY_CARE_PROVIDER_SITE_OTHER): Payer: Medicare HMO

## 2019-06-16 ENCOUNTER — Other Ambulatory Visit: Payer: Self-pay

## 2019-06-16 VITALS — Temp 97.2°F

## 2019-06-16 DIAGNOSIS — L97929 Non-pressure chronic ulcer of unspecified part of left lower leg with unspecified severity: Secondary | ICD-10-CM | POA: Diagnosis not present

## 2019-06-16 DIAGNOSIS — L97919 Non-pressure chronic ulcer of unspecified part of right lower leg with unspecified severity: Secondary | ICD-10-CM | POA: Diagnosis not present

## 2019-06-16 DIAGNOSIS — M79674 Pain in right toe(s): Secondary | ICD-10-CM | POA: Diagnosis not present

## 2019-06-16 DIAGNOSIS — L97911 Non-pressure chronic ulcer of unspecified part of right lower leg limited to breakdown of skin: Secondary | ICD-10-CM | POA: Diagnosis not present

## 2019-06-16 DIAGNOSIS — E11622 Type 2 diabetes mellitus with other skin ulcer: Secondary | ICD-10-CM

## 2019-06-16 DIAGNOSIS — E1140 Type 2 diabetes mellitus with diabetic neuropathy, unspecified: Secondary | ICD-10-CM

## 2019-06-16 DIAGNOSIS — E1149 Type 2 diabetes mellitus with other diabetic neurological complication: Secondary | ICD-10-CM

## 2019-06-16 DIAGNOSIS — L97921 Non-pressure chronic ulcer of unspecified part of left lower leg limited to breakdown of skin: Secondary | ICD-10-CM

## 2019-06-16 DIAGNOSIS — M79672 Pain in left foot: Secondary | ICD-10-CM

## 2019-06-16 DIAGNOSIS — M79671 Pain in right foot: Secondary | ICD-10-CM

## 2019-06-16 DIAGNOSIS — M79675 Pain in left toe(s): Secondary | ICD-10-CM

## 2019-06-16 DIAGNOSIS — B351 Tinea unguium: Secondary | ICD-10-CM

## 2019-06-16 NOTE — Progress Notes (Signed)
Subjective:   Patient ID: Matthew Wolf, male   DOB: 74 y.o.   MRN: [id number]   HPI Patient states he is a diabetic has diminished sensation is developed breakdown of tissue on the heels bilateral and the first metatarsal that he wanted checked and also has nails that he cannot cut and get thick and he knows that he needs diabetic shoes.  Patient's not been seen for almost 3 years   ROS      Objective:  Physical Exam  Neurovascular status diminished but unchanged from previous visit with patient found to have mild breakdown of tissue subfirst metatarsal heel bilateral with patient having severe cavus foot structure creating pressure.  Neuropathic changes are noted with diminished sharp dull vibratory and patient has thickened nailbeds 1-5 both feet that get incurvated in the corners and are aggravated     Assessment:  Mycotic nail infection 1-5 both feet with irritation along with lesion formation     Plan:  Debridement of nailbeds 1-5 both feet with discussion of diabetic shoes which patient wants and I think will be very beneficial to prevent breakdown of tissue and I gave him strict diabetic education and what to do if any breakdown of tissue were to occur with him to make an appointment with [place] immediately or go to the emergency room if he develops any systemic signs of infection

## 2019-06-21 ENCOUNTER — Ambulatory Visit (INDEPENDENT_AMBULATORY_CARE_PROVIDER_SITE_OTHER): Payer: Medicare HMO | Admitting: General Practice

## 2019-06-21 ENCOUNTER — Telehealth: Payer: Self-pay

## 2019-06-21 ENCOUNTER — Telehealth: Payer: Self-pay | Admitting: General Practice

## 2019-06-21 DIAGNOSIS — I10 Essential (primary) hypertension: Secondary | ICD-10-CM | POA: Diagnosis not present

## 2019-06-21 DIAGNOSIS — I152 Hypertension secondary to endocrine disorders: Secondary | ICD-10-CM

## 2019-06-21 DIAGNOSIS — E785 Hyperlipidemia, unspecified: Secondary | ICD-10-CM

## 2019-06-21 DIAGNOSIS — E1169 Type 2 diabetes mellitus with other specified complication: Secondary | ICD-10-CM | POA: Diagnosis not present

## 2019-06-21 DIAGNOSIS — J849 Interstitial pulmonary disease, unspecified: Secondary | ICD-10-CM

## 2019-06-21 DIAGNOSIS — I2510 Atherosclerotic heart disease of native coronary artery without angina pectoris: Secondary | ICD-10-CM | POA: Diagnosis not present

## 2019-06-21 DIAGNOSIS — I5022 Chronic systolic (congestive) heart failure: Secondary | ICD-10-CM | POA: Diagnosis not present

## 2019-06-21 DIAGNOSIS — E1159 Type 2 diabetes mellitus with other circulatory complications: Secondary | ICD-10-CM | POA: Diagnosis not present

## 2019-06-21 DIAGNOSIS — I5020 Unspecified systolic (congestive) heart failure: Secondary | ICD-10-CM | POA: Diagnosis not present

## 2019-06-21 DIAGNOSIS — E782 Mixed hyperlipidemia: Secondary | ICD-10-CM | POA: Diagnosis not present

## 2019-06-21 DIAGNOSIS — R0600 Dyspnea, unspecified: Secondary | ICD-10-CM | POA: Diagnosis not present

## 2019-06-21 NOTE — Telephone Encounter (Signed)
Confirmed and screened pt for appt 06/23/19 

## 2019-06-21 NOTE — Chronic Care Management (AMB) (Signed)
Chronic Care Management   Follow Up Note   06/21/2019 Name: Matthew Wolf MRN: [id number] DOB: December 13, 1945  Referred by: [name], NP Reason for referral : Chronic Care Management (F/U on Chronic disease processess and care coordination needs: HFrEF, HTN, DM2, HLD, Pulmonary dx)   Matthew Wolf is a 74 y.o. year old male who is a primary care patient of Cannady, Barbaraann Faster, NP. The CCM team was consulted for assistance with chronic disease management and care coordination needs.    Review of patient status, including review of consultants reports, relevant laboratory and other test results, and collaboration with appropriate care team members and the patient's provider was performed as part of comprehensive patient evaluation and provision of chronic care management services.    SDOH (Social Determinants of Health) assessments performed: No See Care Plan activities for detailed interventions related to [facility])     Outpatient Encounter Medications as of 06/21/2019  Medication Sig   albuterol (VENTOLIN HFA) 108 (90 Base) MCG/ACT inhaler Inhale 2 puffs into the lungs every 6 (six) hours as needed for wheezing or shortness of breath.   aspirin EC 81 MG tablet Take 81 mg by mouth daily.    atorvastatin (LIPITOR) 80 MG tablet Take 80 mg by mouth at bedtime.    budesonide-formoterol (SYMBICORT) 160-4.5 MCG/ACT inhaler Inhale 2 puffs into the lungs 2 (two) times daily.   empagliflozin (JARDIANCE) 25 MG TABS tablet Take 25 mg by mouth daily before breakfast.   metFORMIN (GLUCOPHAGE) 500 MG tablet Take 500 MG by mouth (one tablet) at noon and 1000 MG by mouth (2 tablets) at 1800.   metoprolol succinate (TOPROL-XL) 50 MG 24 hr tablet Take 50 mg by mouth Nightly. Take at bedtime.   mupirocin ointment (BACTROBAN) 2 % Place 1 application into the nose 2 (two) times daily.   naproxen sodium (ALEVE) 220 MG tablet Take 220 mg by mouth 2 (two) times daily as needed (pain).   nortriptyline  (PAMELOR) 25 MG capsule TAKE 1 CAPSULE (25 MG TOTAL) BY MOUTH 2 (TWO) TIMES DAILY.   Omega-3 Fatty Acids (FISH OIL PO) Take 1 capsule by mouth daily.    sacubitril-valsartan (ENTRESTO) 24-26 MG Take 1 tablet by mouth 2 (two) times daily.   No facility-administered encounter medications on file as of 06/21/2019.     Objective:  BP Readings from Last 3 Encounters:  06/01/19 100/67  04/19/19 107/69  03/24/19 115/71   Lab Results  Component Value Date   HGBA1C 7.1 (H) 04/19/2019   ejection fraction: 25%  Goals Addressed            This Visit's Progress    COMPLETED: He is still so weak from having covid (pt-stated)       Current Barriers: Goal has been completed  Knowledge Deficits related to obtaining home health services   Recent hospitalization with covid-19 and pneumonia complaining of continued weakness   Nurse Case Manager Clinical Goal(s):   Over the next 30 days, patient will work with [facility] to address needs related to continued weakness and cough  Interventions:   Provided education to patient re: the process of obtaining [place] with PCP regarding patient's continued weakness and cough   Discussed plans with patient for ongoing care management follow up and provided patient with direct contact information for care management team  Reviewed scheduled/upcoming provider appointments including: This afternoon virtual visit with PCP.  Initial call with patient's spouse Vickii Chafe she reports patient is continuing to  be weak and tired. She also reports patient still has a significant cough and is needing 02 intermittently when SOB and cough increases. Spouse reports patient blood sugars within normal limits at this time. Agreeable to home health for monitoring and to work with him to increase strength.   PCP placed Home Health Referral   Message sent to referral coordinator   Patient Self Care Activities:   Attends all scheduled provider  appointments  Continued weakness from recent covid-19 and pneumonia Initial goal documentation      RNCM: Pt: "They are going to talk to me about getting a defibrillator" (pt-stated)       CARE PLAN ENTRY (see longtitudinal plan of care for additional care plan information)  Current Barriers:   Chronic Disease Management support, education, and care coordination needs related to CHF, CAD, HTN, HLD, DMII, and Pulmonary Disease  Clinical Goal(s) related to CHF, CAD, HTN, HLD, DMII, and Pulmonary Disease:  Over the next 120 days, patient will:   Work with the care management team to address educational, disease management, and care coordination needs   Begin or continue self health monitoring activities as directed today Measure and record cbg (blood glucose) 1 times daily, Measure and record blood pressure 4 times per week, Measure and record weight daily, and adhere to heart healthy/ADA diet  Call provider office for new or worsened signs and symptoms Blood glucose findings outside established parameters, Blood pressure findings outside established parameters, Weight outside established parameters, Oxygen saturation lower than established parameter, Shortness of breath, and New or worsened symptom related to Pulmonary disease and past history of COVID 19  Call care management team with questions or concerns  Verbalize basic understanding of patient centered plan of care established today  Interventions related to CHF, CAD, HTN, HLD, DMII, and Pulmonary Disease:   Evaluation of current treatment plans and patient's adherence to plan as established by provider- the patient did not have long to talk but states he went to [facility] today and they did blood work. The patient says he goes to his lung doctor on Thursday and he will talk to the cardiologist soon about a defibrillator.  The patient verbalized he had a new ECHO today and his EF is still at 25%  Assessed patient understanding of  disease states- has a good understanding. Did not have a lot of time to talk today but states he will be happy to talk in the future after he gets all of his appointments completed.   Assessed patient's education and care coordination needs- the patient is working with the pharmacist.  Assessed the patients diabetes monitoring of blood sugars. The patient has not been checking at home. The patient had blood work today. Call was not lengthy due to the patient needing to go to an appointment. Will address more on the next outreach.   Provided disease specific education to patient   Collaborated with appropriate clinical care team members regarding patient needs  Patient Self Care Activities related to CHF, CAD, HTN, HLD, DMII, and Pulmonary Disease:   Patient is unable to independently self-manage chronic health conditions  Initial goal documentation         Plan:   The care management team will reach out to the patient again over the next 30 to 60 days. The patient had to go to an appointment.  Unable to schedule a new appointment. The patient verbalized for the [facility] to call at anytime.    Alto Denver RN, MSN, CCM  Community Care Coordinator [place]   [facility] Mobile: [phone]

## 2019-06-21 NOTE — Patient Instructions (Signed)
Visit Information  Goals Addressed            This Visit's Progress   . COMPLETED: He is still so weak from having covid (pt-stated)       Current Barriers: Goal has been completed . Knowledge Deficits related to obtaining home health services  . Recent hospitalization with covid-19 and pneumonia complaining of continued weakness   Nurse Case Manager Clinical Goal(s):  [name] Over the next 30 days, patient will work with [facility] to address needs related to continued weakness and cough  Interventions:  . Provided education to patient re: the process of obtaining Home Health . Collaborated with PCP regarding patient's continued weakness and cough  . Discussed plans with patient for ongoing care management follow up and provided patient with direct contact information for care management team . Reviewed scheduled/upcoming provider appointments including: This afternoon virtual visit with PCP. [name] Initial call with patient's spouse Vickii Chafe she reports patient is continuing to be weak and tired. She also reports patient still has a significant cough and is needing 02 intermittently when SOB and cough increases. Spouse reports patient blood sugars within normal limits at this time. Agreeable to home health for monitoring and to work with him to increase strength.  . PCP placed Home Health Referral  . Message sent to referral coordinator   Patient Self Care Activities:  . Attends all scheduled provider appointments . Continued weakness from recent covid-19 and pneumonia Initial goal documentation     . RNCM: Pt: "They are going to talk to me about getting a defibrillator" (pt-stated)       [place] (see longtitudinal plan of care for additional care plan information)  Current Barriers:  . Chronic Disease Management support, education, and care coordination needs related to CHF, CAD, HTN, HLD, DMII, and Pulmonary Disease  Clinical Goal(s) related to CHF, CAD, HTN, HLD, DMII, and Pulmonary  Disease:  Over the next 120 days, patient will:  . Work with the care management team to address educational, disease management, and care coordination needs  . Begin or continue self health monitoring activities as directed today Measure and record cbg (blood glucose) 1 times daily, Measure and record blood pressure 4 times per week, Measure and record weight daily, and adhere to heart healthy/ADA diet . Call provider office for new or worsened signs and symptoms Blood glucose findings outside established parameters, Blood pressure findings outside established parameters, Weight outside established parameters, Oxygen saturation lower than established parameter, Shortness of breath, and New or worsened symptom related to Pulmonary disease and past history of COVID 19 . Call care management team with questions or concerns . Verbalize basic understanding of patient centered plan of care established today  Interventions related to CHF, CAD, HTN, HLD, DMII, and Pulmonary Disease:  . Evaluation of current treatment plans and patient's adherence to plan as established by provider- the patient did not have long to talk but states he went to [facility] today and they did blood work. The patient says he goes to his lung doctor on Thursday and he will talk to the cardiologist soon about a defibrillator.  The patient verbalized he had a new ECHO today and his EF is still at 25% . Assessed patient understanding of disease states- has a good understanding. Did not have a lot of time to talk today but states he will be happy to talk in the future after he gets all of his appointments completed.  . Assessed patient's education and care  coordination needs- the patient is working with the pharmacist. . Assessed the patients diabetes monitoring of blood sugars. The patient has not been checking at home. The patient had blood work today. Call was not lengthy due to the patient needing to go to an appointment. Will address more  on the next outreach.  . Provided disease specific education to patient  . Collaborated with appropriate clinical care team members regarding patient needs  Patient Self Care Activities related to CHF, CAD, HTN, HLD, DMII, and Pulmonary Disease:  . Patient is unable to independently self-manage chronic health conditions  Initial goal documentation        Patient verbalizes understanding of instructions provided today.   The care management team will reach out to the patient again over the next 30 to 60 days.   Alto Denver RN, MSN, CCM Community Care Coordinator [place]  [facility] Mobile: [phone]

## 2019-06-23 ENCOUNTER — Encounter: Payer: Self-pay | Admitting: Internal Medicine

## 2019-06-23 ENCOUNTER — Ambulatory Visit: Payer: Medicare HMO | Admitting: Internal Medicine

## 2019-06-23 ENCOUNTER — Other Ambulatory Visit: Payer: Self-pay

## 2019-06-23 VITALS — BP 105/64 | HR 77 | Temp 95.8°F | Resp 16 | Ht 67.0 in | Wt 135.4 lb

## 2019-06-23 DIAGNOSIS — I2510 Atherosclerotic heart disease of native coronary artery without angina pectoris: Secondary | ICD-10-CM | POA: Diagnosis not present

## 2019-06-23 DIAGNOSIS — J849 Interstitial pulmonary disease, unspecified: Secondary | ICD-10-CM | POA: Diagnosis not present

## 2019-06-23 DIAGNOSIS — Z9981 Dependence on supplemental oxygen: Secondary | ICD-10-CM

## 2019-06-23 DIAGNOSIS — I700 Atherosclerosis of aorta: Secondary | ICD-10-CM

## 2019-06-23 DIAGNOSIS — J9611 Chronic respiratory failure with hypoxia: Secondary | ICD-10-CM | POA: Diagnosis not present

## 2019-06-23 NOTE — Progress Notes (Signed)
[facility] [address] [place], [place] [address]  Pulmonary Sleep Medicine   Office Visit Note  Patient Name: Matthew Wolf DOB: 12/11/1945 MRN [id number]  Date of Service: 06/23/2019  Complaints/HPI: Pt is here for pulmonary follow up. He has a history of ILD, Respiratory failure with hypoxia, and CAD Overall he feels like his is doing well.  He states he is "better" than his last visit.  He is on oxygen 24/7.  He reports using 2 lpm.  He would like to not have to wear it as much.   He is able to go a few hours at times without it.  He does wear it all the time while sleeping.  He uses Symbicort inhaler as needed.  He rarely uses his albuterol inhaler.    ROS  General: (-) fever, (-) chills, (-) night sweats, (-) weakness Skin: (-) rashes, (-) itching,. Eyes: (-) visual changes, (-) redness, (-) itching. Nose and Sinuses: (-) nasal stuffiness or itchiness, (-) postnasal drip, (-) nosebleeds, (-) sinus trouble. Mouth and Throat: (-) sore throat, (-) hoarseness. Neck: (-) swollen glands, (-) enlarged thyroid, (-) neck pain. Respiratory: - cough, (-) bloody sputum, - shortness of breath, - wheezing. Cardiovascular: - ankle swelling, (-) chest pain. Lymphatic: (-) lymph node enlargement. Neurologic: (-) numbness, (-) tingling. Psychiatric: (-) anxiety, (-) depression   Current Medication: Outpatient Encounter Medications as of 06/23/2019  Medication Sig  . albuterol (VENTOLIN HFA) 108 (90 Base) MCG/ACT inhaler Inhale 2 puffs into the lungs every 6 (six) hours as needed for wheezing or shortness of breath.  [name] aspirin EC 81 MG tablet Take 81 mg by mouth daily.   [name] atorvastatin (LIPITOR) 80 MG tablet Take 80 mg by mouth at bedtime.   . budesonide-formoterol (SYMBICORT) 160-4.5 MCG/ACT inhaler Inhale 2 puffs into the lungs 2 (two) times daily.  . empagliflozin (JARDIANCE) 25 MG TABS tablet Take 25 mg by mouth daily before breakfast.  . metFORMIN (GLUCOPHAGE) 500 MG  tablet Take 500 MG by mouth (one tablet) at noon and 1000 MG by mouth (2 tablets) at 1800.  . metoprolol succinate (TOPROL-XL) 50 MG 24 hr tablet Take 50 mg by mouth Nightly. Take at bedtime.  . mupirocin ointment (BACTROBAN) 2 % Place 1 application into the nose 2 (two) times daily.  . naproxen sodium (ALEVE) 220 MG tablet Take 220 mg by mouth 2 (two) times daily as needed (pain).  . nortriptyline (PAMELOR) 25 MG capsule TAKE 1 CAPSULE (25 MG TOTAL) BY MOUTH 2 (TWO) TIMES DAILY.  [name] Omega-3 Fatty Acids (FISH OIL PO) Take 1 capsule by mouth daily.   . sacubitril-valsartan (ENTRESTO) 24-26 MG Take 1 tablet by mouth 2 (two) times daily.   No facility-administered encounter medications on file as of 06/23/2019.    Surgical History: Past Surgical History:  Procedure Laterality Date  . ANGIOPLASTY     x 2  . CARDIAC SURGERY    . CATARACT EXTRACTION, BILATERAL  06/2015  . CORONARY ARTERY BYPASS GRAFT  1991  . HERNIA REPAIR      Medical History: Past Medical History:  Diagnosis Date  . BPH (benign prostatic hypertrophy)   . CKD (chronic kidney disease) stage 3, GFR 30-59 ml/min   . Diabetes (HCC)   . Diabetic peripheral neuropathy (HCC)   . HBP (high blood pressure)   . Heart trouble   . Hyperlipidemia   . Hypertensive kidney disease   . MI (myocardial infarction) (HCC) 1991, 28    Family  History: Family History  Problem Relation Age of Onset  . Diabetes Mother   . Heart disease Mother   . Hypertension Mother   . Emphysema Father   . Lung cancer Father   . Breast cancer Sister   . Heart disease Maternal Aunt   . Stroke Neg Hx     Social History: Social History   Socioeconomic History  . Marital status: Married    Spouse name: Not on file  . Number of children: Not on file  . Years of education: Not on file  . Highest education level: 12th grade  Occupational History  . Occupation: Data processing manager    Comment: fulltime   Tobacco Use  . Smoking status: Former Smoker     Types: Cigarettes    Quit date: 04/07/1984    Years since quitting: 35.2  . Smokeless tobacco: Never Used  Substance and Sexual Activity  . Alcohol use: No  . Drug use: No  . Sexual activity: Yes  Other Topics Concern  . Not on file  Social History Narrative  . Not on file   Social Determinants of Health   Financial Resource Strain:   . Difficulty of Paying Living Expenses:   Food Insecurity:   . Worried About Programme researcher, broadcasting/film/video in the Last Year:   . Barista in the Last Year:   Transportation Needs:   . Freight forwarder (Medical):   [name] Lack of Transportation (Non-Medical):   Physical Activity:   . Days of Exercise per Week:   . Minutes of Exercise per Session:   Stress:   . Feeling of Stress :   Social Connections:   . Frequency of Communication with Friends and Family:   . Frequency of Social Gatherings with Friends and Family:   . Attends Religious Services:   . Active Member of Clubs or Organizations:   . Attends Banker Meetings:   [name] Marital Status:   Intimate Partner Violence:   . Fear of Current or Ex-Partner:   . Emotionally Abused:   [name] Physically Abused:   . Sexually Abused:     Vital Signs: Blood pressure 105/64, pulse 77, temperature (!) 95.8 F (35.4 C), resp. rate 16, height 5\' 7"  (1.702 m), weight 135 lb 6.4 oz (61.4 kg), SpO2 93 %.  Examination: General Appearance: The patient is well-developed, well-nourished, and in no distress. Skin: Gross inspection of skin unremarkable. Head: normocephalic, no gross deformities. Eyes: no gross deformities noted. ENT: ears appear grossly normal no exudates. Neck: Supple. No thyromegaly. No LAD. Respiratory: clear bilaterally, no rales or ronchi noted. Cardiovascular: Normal S1 and S2 without murmur or rub. Extremities: No cyanosis. pulses are equal. Neurologic: Alert and oriented. No involuntary movements.  LABS: Recent Results (from the past 2160 hour(s))  Bayer DCA Hb A1c  Waived     Status: Abnormal   Collection Time: 04/19/19  9:08 AM  Result Value Ref Range   HB A1C (BAYER DCA - WAIVED) 7.1 (H) <7.0 %    Comment:                                       Diabetic Adult            <7.0  Healthy Adult        4.3 - 5.7                                                           (DCCT/NGSP) American Diabetes Association's Summary of Glycemic Recommendations for Adults with Diabetes: Hemoglobin A1c <7.0%. More stringent glycemic goals (A1c <6.0%) may further reduce complications at the cost of increased risk of hypoglycemia.   Basic Metabolic Panel (BMET)     Status: Abnormal   Collection Time: 04/19/19  9:10 AM  Result Value Ref Range   Glucose 190 (H) 65 - 99 mg/dL   BUN 18 8 - 27 mg/dL   Creatinine, Ser 1.44 0.76 - 1.27 mg/dL   GFR calc non Af Amer 70 >59 mL/min/1.73   GFR calc Af Amer 81 >59 mL/min/1.73   BUN/Creatinine Ratio 17 10 - 24   Sodium 136 134 - 144 mmol/L   Potassium 5.9 (H) 3.5 - 5.2 mmol/L   Chloride 96 96 - 106 mmol/L   CO2 23 20 - 29 mmol/L   Calcium 9.8 8.6 - 10.2 mg/dL  Lipid Panel w/o Chol/HDL Ratio out     Status: Abnormal   Collection Time: 04/19/19  9:10 AM  Result Value Ref Range   Cholesterol, Total 119 100 - 199 mg/dL   Triglycerides 818 0 - 149 mg/dL   HDL 29 (L) >56 mg/dL   VLDL Cholesterol Cal 26 5 - 40 mg/dL   LDL Chol Calc (NIH) 64 0 - 99 mg/dL  CBC with Differential/Platelet out     Status: Abnormal   Collection Time: 04/19/19  9:10 AM  Result Value Ref Range   WBC 12.1 (H) 3.4 - 10.8 x10E3/uL   RBC 4.44 4.14 - 5.80 x10E6/uL   Hemoglobin 14.4 13.0 - 17.7 g/dL   Hematocrit 31.4 97.0 - 51.0 %   MCV 95 79 - 97 fL   MCH 32.4 26.6 - 33.0 pg   MCHC 34.0 31.5 - 35.7 g/dL   RDW 26.3 78.5 - 88.5 %   Platelets 350 150 - 450 x10E3/uL   Neutrophils 67 Not Estab. %   Lymphs 23 Not Estab. %   Monocytes 7 Not Estab. %   Eos 1 Not Estab. %   Basos 1 Not Estab. %   Neutrophils Absolute 8.3  (H) 1.4 - 7.0 x10E3/uL   Lymphocytes Absolute 2.7 0.7 - 3.1 x10E3/uL   Monocytes Absolute 0.8 0.1 - 0.9 x10E3/uL   EOS (ABSOLUTE) 0.2 0.0 - 0.4 x10E3/uL   Basophils Absolute 0.1 0.0 - 0.2 x10E3/uL   Immature Granulocytes 1 Not Estab. %   Immature Grans (Abs) 0.1 0.0 - 0.1 x10E3/uL  Comprehensive metabolic panel     Status: Abnormal   Collection Time: 06/01/19 11:01 AM  Result Value Ref Range   Glucose 160 (H) 65 - 99 mg/dL   BUN 18 8 - 27 mg/dL   Creatinine, Ser 0.27 0.76 - 1.27 mg/dL   GFR calc non Af Amer 72 >59 mL/min/1.73   GFR calc Af Amer 83 >59 mL/min/1.73   BUN/Creatinine Ratio 18 10 - 24   Sodium 140 134 - 144 mmol/L   Potassium 5.3 (H) 3.5 - 5.2 mmol/L   Chloride 99 96 - 106 mmol/L   CO2 24 20 - 29 mmol/L  Calcium 9.5 8.6 - 10.2 mg/dL   Total Protein 6.9 6.0 - 8.5 g/dL   Albumin 4.7 3.7 - 4.7 g/dL   Globulin, Total 2.2 1.5 - 4.5 g/dL   Albumin/Globulin Ratio 2.1 1.2 - 2.2   Bilirubin Total 0.4 0.0 - 1.2 mg/dL   Alkaline Phosphatase 119 (H) 39 - 117 IU/L   AST 18 0 - 40 IU/L   ALT 17 0 - 44 IU/L  Uric acid     Status: None   Collection Time: 06/01/19 11:01 AM  Result Value Ref Range   Uric Acid 4.3 3.8 - 8.4 mg/dL    Comment:            Therapeutic target for gout patients: <6.0    Radiology: CT Chest High Resolution  Result Date: 03/17/2019 CLINICAL DATA:  74 year old male with history of COVID-19 infection in October 2020. Shortness of breath and cough since that time. EXAM: CT CHEST WITHOUT CONTRAST TECHNIQUE: Multidetector CT imaging of the chest was performed following the standard protocol without intravenous contrast. High resolution imaging of the lungs, as well as inspiratory and expiratory imaging, was performed. COMPARISON:  No priors. FINDINGS: Cardiovascular: Heart size is normal. There is no significant pericardial fluid, thickening or pericardial calcification. There is aortic atherosclerosis, as well as atherosclerosis of the great vessels of the  mediastinum and the coronary arteries, including calcified atherosclerotic plaque in the left main, left anterior descending, left circumflex and right coronary arteries. Coronary artery stents noted in the left anterior descending, left circumflex and right coronary arteries. Status post median sternotomy for CABG including LIMA to the LAD. Mediastinum/Nodes: No pathologically enlarged mediastinal or hilar lymph nodes. Please note that accurate exclusion of hilar adenopathy is limited on noncontrast CT scans. Esophagus is unremarkable in appearance. No axillary lymphadenopathy. Lungs/Pleura: Patchy areas of ground-glass attenuation, septal thickening, subpleural reticulation, traction bronchiectasis, and peripheral bronchiolectasis in the lungs bilaterally. Findings have a mild craniocaudal gradient. No definitive honeycombing confidently identified. Inspiratory and expiratory imaging is unremarkable. No acute consolidative airspace disease. No pleural effusions. 10 x 4 mm (mean diameter of 7 mm) right middle lobe nodule (axial image 83 of series 3). No other larger more suspicious appearing pulmonary nodules or masses are noted. Upper Abdomen: Aortic atherosclerosis. Musculoskeletal: Median sternotomy wires. There are no aggressive appearing lytic or blastic lesions noted in the visualized portions of the skeleton. IMPRESSION: 1. There are findings in the lungs concerning for interstitial lung disease, with a spectrum of findings considered probable usual interstitial pneumonia (UIP) per current ATS guidelines. 2. 7 mm right middle lobe pulmonary nodule (axial image 83 of series 3). Non-contrast chest CT at 6-12 months is recommended. If the nodule is stable at time of repeat CT, then future CT at 18-24 months (from today's scan) is considered optional for low-risk patients, but is recommended for high-risk patients. This recommendation follows the consensus statement: Guidelines for Management of Incidental  Pulmonary Nodules Detected on CT Images: From the Fleischner Society 2017; Radiology 2017; 284:228-243. 3. Aortic atherosclerosis, in addition to left main and 3 vessel coronary artery disease. Status post median sternotomy for CABG including LIMA to the LAD, as well as PTCI. Aortic Atherosclerosis (ICD10-I70.0). Electronically Signed   By: Vinnie Langton M.D.   On: 03/17/2019 16:05    No results found.  DG Foot 2 Views Left  Result Date: 06/16/2019 Please see detailed radiograph report in office note.  DG Foot 2 Views Right  Result Date: 06/16/2019 Please see detailed radiograph  report in office note.     Assessment and Plan: Patient Active Problem List   Diagnosis Date Noted  . Pain of left great toe 06/01/2019  . Abrasion of right heel 06/01/2019  . Interstitial pulmonary disease (HCC) 04/19/2019  . Type 2 diabetes mellitus with cardiac complication (HCC) 04/16/2019  . HFrEF (heart failure with reduced ejection fraction) (HCC) 04/12/2019  . History of 2019 novel coronavirus disease (COVID-19) 03/22/2019  . Prostate cancer screening 03/16/2015  . Coronary artery disease 11/30/2014  . Hypertension associated with diabetes (HCC)   . Hyperlipidemia associated with type 2 diabetes mellitus (HCC)   . Diabetic peripheral neuropathy (HCC)     1. Interstitial pulmonary disease (HCC) Continue current medications, monitor symptoms and follow up as scheduled.   2. Supplemental oxygen dependent Continue to use at night, and during the day as needed.   3. Chronic respiratory failure with hypoxia (HCC) Patient uses oxygen continuously, encouraged ongoing oxygen use as needed.    4. CAD, multiple vessel [facility]   5. Aortic atherosclerosis (HCC) On CT 03/17/2019 Continue Lipitor at this time. Continue to monitor.  Consider carotid US at next visit.   General Counseling: I have discussed the findings of the evaluation and examination  with Dorene SorrowJerry.  I have also discussed any further diagnostic evaluation thatmay be needed or ordered today. Dorene SorrowJerry verbalizes understanding of the findings of todays visit. We also reviewed his medications today and discussed drug interactions and side effects including but not limited excessive drowsiness and altered mental states. We also discussed that there is always a risk not just to him but also people around him. he has been encouraged to call the office with any questions or concerns that should arise related to todays visit.  No orders of the defined types were placed in this encounter.    Time spent: 25 This patient was seen by Blima LedgerAdam Fizza Scales AGNP-C in Collaboration with Dr. Freda MunroSaadat Khan as a part of collaborative care agreement.   I have personally obtained a history, examined the patient, evaluated laboratory and imaging results, formulated the assessment and plan and placed orders.    Yevonne PaxSaadat A Khan, MD [facility] and [facility] Sleep medicine

## 2019-06-24 ENCOUNTER — Other Ambulatory Visit: Payer: Self-pay | Admitting: Internal Medicine

## 2019-06-24 DIAGNOSIS — R0602 Shortness of breath: Secondary | ICD-10-CM

## 2019-06-28 ENCOUNTER — Telehealth: Payer: Self-pay | Admitting: Podiatry

## 2019-06-28 ENCOUNTER — Telehealth: Payer: Self-pay

## 2019-06-28 NOTE — Telephone Encounter (Signed)
Received a voicemail yesterday from Tiffany @ [facility] asking about diabetic shoe paperwork for pt.  I returned call and left message we did receive it and are good to go for the diabetic shoes and to call if any questions.

## 2019-06-29 DIAGNOSIS — U071 COVID-19: Secondary | ICD-10-CM | POA: Diagnosis not present

## 2019-07-01 ENCOUNTER — Other Ambulatory Visit: Payer: Self-pay | Admitting: Nurse Practitioner

## 2019-07-07 DIAGNOSIS — Z951 Presence of aortocoronary bypass graft: Secondary | ICD-10-CM | POA: Diagnosis not present

## 2019-07-07 DIAGNOSIS — R918 Other nonspecific abnormal finding of lung field: Secondary | ICD-10-CM | POA: Diagnosis not present

## 2019-07-07 DIAGNOSIS — Z87891 Personal history of nicotine dependence: Secondary | ICD-10-CM | POA: Diagnosis not present

## 2019-07-07 DIAGNOSIS — I491 Atrial premature depolarization: Secondary | ICD-10-CM | POA: Diagnosis not present

## 2019-07-07 DIAGNOSIS — I440 Atrioventricular block, first degree: Secondary | ICD-10-CM | POA: Diagnosis not present

## 2019-07-07 DIAGNOSIS — Z9889 Other specified postprocedural states: Secondary | ICD-10-CM | POA: Diagnosis not present

## 2019-07-07 DIAGNOSIS — I4510 Unspecified right bundle-branch block: Secondary | ICD-10-CM | POA: Diagnosis not present

## 2019-07-07 DIAGNOSIS — R9431 Abnormal electrocardiogram [ECG] [EKG]: Secondary | ICD-10-CM | POA: Diagnosis not present

## 2019-07-07 DIAGNOSIS — I5022 Chronic systolic (congestive) heart failure: Secondary | ICD-10-CM | POA: Diagnosis not present

## 2019-07-07 DIAGNOSIS — M47814 Spondylosis without myelopathy or radiculopathy, thoracic region: Secondary | ICD-10-CM | POA: Diagnosis not present

## 2019-07-07 DIAGNOSIS — I5020 Unspecified systolic (congestive) heart failure: Secondary | ICD-10-CM | POA: Diagnosis not present

## 2019-07-08 ENCOUNTER — Telehealth: Payer: Self-pay

## 2019-07-08 NOTE — Telephone Encounter (Signed)
Copied from CRM [id number]. Topic: General - Other >> Jul 07, 2019  2:48 PM Dalphine Handing A wrote: Patient spouse stated that patient would like a callback form Jolene/nurse  in regards to a procedure he is having done with [facility]. Please advise  Best contact [phone] >> Jul 08, 2019 12:19 PM Pablo Ledger, [place] wrote: Called and spoke to patient. He states that he was seen at cardiology yesterday and found out that his hear is now only pumping at 15% instead of 25%. He states he is having some procedure on 07/25/19 for this and just wants to talk to [facility] and get some more information and her input on all of this. Did let patient know that they office is closed this afternoon and that it may be Monday before her response.

## 2019-07-08 NOTE — Telephone Encounter (Signed)
Reports he went to [facility] yesterday because he needed an echo and EF has decreased to 15%, previous was 25%.  They did a chest x-ray yesterday as well.  They are recommend defib placement, wanted to know PCP opinion on this. Reviewed recent cardiology note with patient.  Discussed with him that placement of ICD would be beneficial at this point due to his ongoing cardiac issues since Covid.  Also recommend he continue to closely monitor weight and alert cardiology or PCP with 2 pounds in 24 hours of gain or 5 pounds in a week.  He stated appreciation for return call.

## 2019-07-14 DIAGNOSIS — E782 Mixed hyperlipidemia: Secondary | ICD-10-CM | POA: Diagnosis not present

## 2019-07-14 DIAGNOSIS — I10 Essential (primary) hypertension: Secondary | ICD-10-CM | POA: Diagnosis not present

## 2019-07-14 DIAGNOSIS — I5020 Unspecified systolic (congestive) heart failure: Secondary | ICD-10-CM | POA: Diagnosis not present

## 2019-07-14 DIAGNOSIS — I2510 Atherosclerotic heart disease of native coronary artery without angina pectoris: Secondary | ICD-10-CM | POA: Diagnosis not present

## 2019-07-18 ENCOUNTER — Other Ambulatory Visit: Payer: Self-pay

## 2019-07-18 ENCOUNTER — Ambulatory Visit (INDEPENDENT_AMBULATORY_CARE_PROVIDER_SITE_OTHER): Payer: Medicare HMO | Admitting: Orthotics

## 2019-07-18 ENCOUNTER — Encounter: Payer: Self-pay | Admitting: Nurse Practitioner

## 2019-07-18 ENCOUNTER — Ambulatory Visit (INDEPENDENT_AMBULATORY_CARE_PROVIDER_SITE_OTHER): Payer: Medicare HMO | Admitting: Nurse Practitioner

## 2019-07-18 VITALS — BP 104/66 | HR 89 | Temp 97.6°F | Wt 130.0 lb

## 2019-07-18 DIAGNOSIS — J849 Interstitial pulmonary disease, unspecified: Secondary | ICD-10-CM

## 2019-07-18 DIAGNOSIS — E1159 Type 2 diabetes mellitus with other circulatory complications: Secondary | ICD-10-CM | POA: Diagnosis not present

## 2019-07-18 DIAGNOSIS — M79672 Pain in left foot: Secondary | ICD-10-CM

## 2019-07-18 DIAGNOSIS — I5020 Unspecified systolic (congestive) heart failure: Secondary | ICD-10-CM

## 2019-07-18 DIAGNOSIS — E1140 Type 2 diabetes mellitus with diabetic neuropathy, unspecified: Secondary | ICD-10-CM

## 2019-07-18 DIAGNOSIS — L97922 Non-pressure chronic ulcer of unspecified part of left lower leg with fat layer exposed: Secondary | ICD-10-CM | POA: Diagnosis not present

## 2019-07-18 DIAGNOSIS — Z8616 Personal history of COVID-19: Secondary | ICD-10-CM

## 2019-07-18 DIAGNOSIS — E1149 Type 2 diabetes mellitus with other diabetic neurological complication: Secondary | ICD-10-CM

## 2019-07-18 DIAGNOSIS — E785 Hyperlipidemia, unspecified: Secondary | ICD-10-CM | POA: Diagnosis not present

## 2019-07-18 DIAGNOSIS — L97911 Non-pressure chronic ulcer of unspecified part of right lower leg limited to breakdown of skin: Secondary | ICD-10-CM | POA: Diagnosis not present

## 2019-07-18 DIAGNOSIS — E1169 Type 2 diabetes mellitus with other specified complication: Secondary | ICD-10-CM | POA: Diagnosis not present

## 2019-07-18 DIAGNOSIS — I10 Essential (primary) hypertension: Secondary | ICD-10-CM | POA: Diagnosis not present

## 2019-07-18 DIAGNOSIS — M79671 Pain in right foot: Secondary | ICD-10-CM

## 2019-07-18 DIAGNOSIS — I152 Hypertension secondary to endocrine disorders: Secondary | ICD-10-CM

## 2019-07-18 LAB — MICROALBUMIN, URINE WAIVED
Creatinine, Urine Waived: 10 mg/dL (ref 10–300)
Microalb, Ur Waived: 30 mg/L — ABNORMAL HIGH (ref 0–19)

## 2019-07-18 LAB — BAYER DCA HB A1C WAIVED: HB A1C (BAYER DCA - WAIVED): 8.7 % — ABNORMAL HIGH (ref <7.0)

## 2019-07-18 NOTE — Progress Notes (Signed)

## 2019-07-18 NOTE — Assessment & Plan Note (Signed)
Chronic, ongoing.  Continue current medication regimen and adjust as needed.  Lipid panel next visit.    

## 2019-07-18 NOTE — Progress Notes (Signed)
BP 104/66   Pulse 89   Temp 97.6 F (36.4 C) (Oral)   Wt 130 lb (59 kg)   SpO2 96%   BMI 20.36 kg/m    Subjective:    Patient ID: Matthew Wolf, male    DOB: 09/14/1945, 74 y.o.   MRN: [id number]  HPI: Matthew Wolf is a 74 y.o. male  Chief Complaint  Patient presents with  . Diabetes  . Hyperlipidemia  . Hypertension   DIABETES January A1C 7.1%.  Continues on Metformin 500 MG two times a day (500 MG in morning and 1000 MG at night) and Jardiance 25 MG daily, this was increased to this dose last visit.  Has been tolerating Metformin without GI issues this time. Eats a piece of pie a week, but not daily.   Hypoglycemic episodes:no Polydipsia/polyuria: no Visual disturbance: no Chest pain: no Paresthesias: no Glucose Monitoring: no             Accucheck frequency: Not Checking             Fasting glucose:             Post prandial:             Evening:             Before meals: Taking Insulin?: no             Long acting insulin:             Short acting insulin: Blood Pressure Monitoring: daily Retinal Examination: goes in August Foot Exam: Up to Date Pneumovax: Up to Date Influenza: Up to Date Aspirin: yes   HYPERTENSION / HYPERLIPIDEMIA/HF Continues on Entresto 24-26 MG 1 tablet BID and Metoprolol XL 50 MG nightly ordered by cardiology.  Continues on Atorvastatin.  HF is new diagnosis after Covid, he is being followed by cardiology.  Recent EF 15%, decline from December 2020, when 25%, despite GDMT.  Cardiology at [facility] is following and they are discussing ICD placement with him, saw Dr. Chilton Greathouse at [facility] about this on 07/14/19 for feet edema and was given a few days of Lasix.  Scheduled for ICD implantation on 07/25/19.  He is weighing himself daily and avoiding high sodium foods. Satisfied with current treatment? yes Duration of hypertension: chronic BP monitoring frequency: daily BP range: 100-110/60-70 BP medication side effects: no Duration of hyperlipidemia:  chronic Cholesterol medication side effects: no Cholesterol supplements: none Medication compliance: good compliance Aspirin: yes Recent stressors: no Recurrent headaches: no Visual changes: no Palpitations: no Dyspnea: yes, baseline at this time Chest pain: no Lower extremity edema: no Dizzy/lightheaded: no  COVID 19 FOLLOW-UP: Covid positive testing initially on 01/14/2019. He was admitted to [facility] on 01/24/2019 after worsening symptoms presented and discharged on 01/29/2019. He reports his SOB is improving,  using O2 plus inhaler as needed -- has been using only one time a day.  Continues to use Tessalon and Tussin.  Reports nonproductive cough.  Using incentive spirometry 4-5 times a day.  Continues to not have any taste.  He is currently being followed by pulmonary, Dr. Park Breed and is dependent on O2.  Last saw Dr. Park Breed on 06/23/19 with no changes.  He is checking O2 sats at home and majority of time staying above 90%, is on 2L of [place] as needed -- uses this more in afternoon and at night, not while at work.  He is back at work and staying busy, working 5 hours.  Relevant past medical, surgical, family and social history reviewed and updated as indicated. Interim medical history since our last visit reviewed. Allergies and medications reviewed and updated.  Review of Systems  Constitutional: Positive for fatigue. Negative for activity change, diaphoresis and fever.  Respiratory: Positive for cough (intermittent) and shortness of breath (at baseline). Negative for chest tightness and wheezing.   Cardiovascular: Negative for chest pain, palpitations and leg swelling.  Endocrine: Negative for cold intolerance, heat intolerance, polydipsia, polyphagia and polyuria.  Neurological: Negative for dizziness, syncope, weakness, light-headedness, numbness and headaches.  Psychiatric/Behavioral: Negative.     Per HPI unless specifically indicated above     Objective:    BP 104/66    Pulse 89   Temp 97.6 F (36.4 C) (Oral)   Wt 130 lb (59 kg)   SpO2 96%   BMI 20.36 kg/m   Wt Readings from Last 3 Encounters:  07/18/19 130 lb (59 kg)  06/23/19 135 lb 6.4 oz (61.4 kg)  06/01/19 131 lb 6.4 oz (59.6 kg)    Physical Exam Vitals and nursing note reviewed.  Constitutional:      General: He is awake. He is not in acute distress.    Appearance: He is well-developed. He is not ill-appearing.  HENT:     Head: Normocephalic and atraumatic.     Right Ear: Hearing normal. No drainage.     Left Ear: Hearing normal. No drainage.  Eyes:     General: Lids are normal.        Right eye: No discharge.        Left eye: No discharge.     Conjunctiva/sclera: Conjunctivae normal.     Pupils: Pupils are equal, round, and reactive to light.  Neck:     Vascular: No carotid bruit or JVD.  Cardiovascular:     Rate and Rhythm: Normal rate and regular rhythm.     Heart sounds: Normal heart sounds, S1 normal and S2 normal. No murmur. No gallop.   Pulmonary:     Effort: Pulmonary effort is normal. No accessory muscle usage or respiratory distress.     Breath sounds: Normal breath sounds.  Abdominal:     General: Bowel sounds are normal.     Palpations: Abdomen is soft.  Musculoskeletal:        General: Normal range of motion.     Cervical back: Normal range of motion and neck supple.     Right lower leg: No edema.     Left lower leg: No edema.  Skin:    General: Skin is warm and dry.     Capillary Refill: Capillary refill takes less than 2 seconds.  Neurological:     Mental Status: He is alert and oriented to person, place, and time.  Psychiatric:        Mood and Affect: Mood normal.        Behavior: Behavior normal. Behavior is cooperative.        Thought Content: Thought content normal.        Judgment: Judgment normal.    Results for orders placed or performed in visit on 06/01/19  Comprehensive metabolic panel  Result Value Ref Range   Glucose 160 (H) 65 - 99 mg/dL    BUN 18 8 - 27 mg/dL   Creatinine, Ser 8.54 0.76 - 1.27 mg/dL   GFR calc non Af Amer 72 >59 mL/min/1.73   GFR calc Af Amer 83 >59 mL/min/1.73   BUN/Creatinine Ratio 18 10 - 24  Sodium 140 134 - 144 mmol/L   Potassium 5.3 (H) 3.5 - 5.2 mmol/L   Chloride 99 96 - 106 mmol/L   CO2 24 20 - 29 mmol/L   Calcium 9.5 8.6 - 10.2 mg/dL   Total Protein 6.9 6.0 - 8.5 g/dL   Albumin 4.7 3.7 - 4.7 g/dL   Globulin, Total 2.2 1.5 - 4.5 g/dL   Albumin/Globulin Ratio 2.1 1.2 - 2.2   Bilirubin Total 0.4 0.0 - 1.2 mg/dL   Alkaline Phosphatase 119 (H) 39 - 117 IU/L   AST 18 0 - 40 IU/L   ALT 17 0 - 44 IU/L  Uric acid  Result Value Ref Range   Uric Acid 4.3 3.8 - 8.4 mg/dL      Assessment & Plan:   Problem List Items Addressed This Visit      Cardiovascular and Mediastinum   Hypertension associated with diabetes (HCC)    Chronic, ongoing.  BP below goal today.  Continue current medication regimen and collaboration with cardiology.  Continue to monitor BP daily at home.  Obtain BMP today.  A1C elevated from previous at 8.7%, will discuss with [place] PharmD.  May benefit from GLP for A1C control, but concern about weight loss in this patient.      Relevant Medications   furosemide (LASIX) 20 MG tablet   Other Relevant Orders   Bayer DCA Hb A1c Waived   Basic metabolic panel   HFrEF (heart failure with reduced ejection fraction) ([place])    New diagnosis since Covid.  Continue collaboration with cardiology at [facility], will obtain BMP today.  BP at goal today.    Recommend: - Reminded to call for an overnight weight gain of >2 pounds or a weekly weight weight of >5 pounds - not adding salt to his food and has been reading food labels. 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   Continue collaboration with cardiology and current medication regimen.   A1C elevated from previous at 8.7% today, will discuss with [place] PharmD.  May benefit from GLP for A1C control, but concern about weight loss in this patient.  Will collaborate with PharmD for next best steps in this patient, has been on Glyburide in past, but concern for hypoglycemia.  Continue Metformin at current dose, can not tolerate higher/full dosing.  BMP today.  Return in 3 months, sooner if medication changes.      Relevant Medications   furosemide (LASIX) 20 MG tablet   Other Relevant Orders   Bayer DCA Hb A1c Waived   Basic metabolic panel   Microalbumin, Urine Waived     Respiratory   Interstitial pulmonary disease (HCC)    Chronic, ongoing issue since Covid.  O2 dependent at this time.  Followed by pulmonary. Continue current medication regimen as prescribed by them.  Continue to collaborate with pulmonary and home O2 therapy.        Endocrine   Hyperlipidemia associated with type 2 diabetes mellitus (HCC)    Chronic, ongoing.  Continue current medication regimen and adjust as needed.  Lipid panel next visit.        Other   History of 2019 novel coronavirus disease (COVID-19)    Continue to collaborate with pulmonary and cardiology  due to new diagnosis of HF and interstitial lung disease since contracting the virus.  Recent CBC performed by cardiology.          Follow up plan: Return in about 3 months (around 10/17/2019) for T2DM, HTN.HLD, HF, Intersitial Lung Disease.

## 2019-07-18 NOTE — Assessment & Plan Note (Signed)
New diagnosis since Covid.  Continue collaboration with cardiology at [facility], will obtain BMP today.  BP at goal today.    Recommend: - Reminded to call for an overnight weight gain of >2 pounds or a weekly weight weight of >5 pounds - not adding salt to his food and has been reading food labels. Reviewed the importance of keeping daily sodium intake to 2000mg  daily

## 2019-07-18 NOTE — Assessment & Plan Note (Signed)
Chronic, ongoing.  BP below goal today.  Continue current medication regimen and collaboration with cardiology.  Continue to monitor BP daily at home.  Obtain BMP today.  A1C elevated from previous at 8.7%, will discuss with Santina Evans CCM PharmD.  May benefit from GLP for A1C control, but concern about weight loss in this patient.

## 2019-07-18 NOTE — Assessment & Plan Note (Signed)
Chronic, ongoing with new diagnosis of HF since Covid.  Continue collaboration with cardiology and current medication regimen.   A1C elevated from previous at 8.7% today, will discuss with Santina Evans CCM PharmD.  May benefit from GLP for A1C control, but concern about weight loss in this patient.  Will collaborate with PharmD for next best steps in this patient, has been on Glyburide in past, but concern for hypoglycemia.  Continue Metformin at current dose, can not tolerate higher/full dosing.  BMP today.  Return in 3 months, sooner if medication changes.

## 2019-07-18 NOTE — Patient Instructions (Signed)
Heart Failure Eating Plan Heart failure, also called congestive heart failure, occurs when your heart does not pump blood well enough to meet your body's needs for oxygen-rich blood. Heart failure is a long-term (chronic) condition. Living with heart failure can be challenging. However, following your health care provider's instructions about a healthy lifestyle and working with a diet and nutrition specialist (dietitian) to choose the right foods may help to improve your symptoms. What are tips for following this plan? Reading food labels  Check food labels for the amount of sodium per serving. Choose foods that have less than 140 mg (milligrams) of sodium in each serving.  Check food labels for the number of calories per serving. This is important if you need to limit your daily calorie intake to lose weight.  Check food labels for the serving size. If you eat more than one serving, you will be eating more sodium and calories than what is listed on the label.  Look for foods that are labeled as "sodium-free," "very low sodium," or "low sodium." ? Foods labeled as "reduced sodium" or "lightly salted" may still have more sodium than what is recommended for you. Cooking  Avoid adding salt when cooking. Ask your health care provider or dietitian before using salt substitutes.  Season food with salt-free seasonings, spices, or herbs. Check the label of seasoning mixes to make sure they do not contain salt.  Cook with heart-healthy oils, such as olive, canola, soybean, or sunflower oil.  Do not fry foods. Cook foods using low-fat methods, such as baking, boiling, grilling, and broiling.  Limit unhealthy fats when cooking by: ? Removing the skin from poultry, such as chicken. ? Removing all visible fats from meats. ? Skimming the fat off from stews, soups, and gravies before serving them. Meal planning   Limit your intake of: ? Processed, canned, or pre-packaged foods. ? Foods that are  high in trans fat, such as fried foods. ? Sweets, desserts, sugary drinks, and other foods with added sugar. ? Full-fat dairy products, such as whole milk.  Eat a balanced diet that includes: ? 4-5 servings of fruit each day and 4-5 servings of vegetables each day. At each meal, try to fill half of your plate with fruits and vegetables. ? Up to 6-8 servings of whole grains each day. ? Up to 2 servings of lean meat, poultry, or fish each day. One serving of meat is equal to 3 oz. This is about the same size as a deck of cards. ? 2 servings of low-fat dairy each day. ? Heart-healthy fats. Healthy fats called omega-3 fatty acids are found in foods such as flaxseed and cold-water fish like sardines, salmon, and mackerel.  Aim to eat 25-35 g (grams) of fiber a day. Foods that are high in fiber include apples, broccoli, carrots, beans, peas, and whole grains.  Do not add salt or condiments that contain salt (such as soy sauce) to foods before eating.  When eating at a restaurant, ask that your food be prepared with less salt or no salt, if possible.  Try to eat 2 or more vegetarian meals each week.  Eat more home-cooked food and eat less restaurant, buffet, and fast food. General information  Do not eat more than 2,300 mg of salt (sodium) a day. The amount of sodium that is recommended for you may be lower, depending on your condition.  Maintain a healthy body weight as directed. Ask your health care provider what a healthy weight is   for you. ? Check your weight every day. ? Work with your health care provider and dietitian to make a plan that is right for you to lose weight or maintain your current weight.  Limit how much fluid you drink. Ask your health care provider or dietitian how much fluid you can have each day.  Limit or avoid alcohol as told by your health care provider or dietitian. Recommended foods The items listed may not be a complete list. Talk with your dietitian about what  dietary choices are best for you. Fruits All fresh, frozen, and canned fruits. Dried fruits, such as raisins, prunes, and cranberries. Vegetables All fresh vegetables. Vegetables that are frozen without sauce or added salt. Low-sodium or sodium-free canned vegetables. Grains Bread with less than 80 mg of sodium per slice. Whole-wheat pasta, quinoa, and brown rice. Oats and oatmeal. Barley. Millet. Grits and cream of wheat. Whole-grain and whole-wheat cold cereal. Meats and other protein foods Lean cuts of meat. Skinless chicken and turkey. Fish with high omega-3 fatty acids, such as salmon, sardines, and other cold-water fishes. Eggs. Dried beans, peas, and edamame. Unsalted nuts and nut butters. Dairy Low-fat or nonfat (skim) milk and dried milk. Rice milk, soy milk, and almond milk. Low-fat or nonfat yogurt. Small amounts of reduced-sodium block cheese. Low-sodium cottage cheese. Fats and oils Olive, canola, soybean, flaxseed, or sunflower oil. Avocado. Sweets and desserts Apple sauce. Granola bars. Sugar-free pudding and gelatin. Frozen fruit bars. Seasoning and other foods Fresh and dried herbs. Lemon or lime juice. Vinegar. Low-sodium ketchup. Salt-free marinades, salad dressings, sauces, and seasonings. The items listed above may not be a complete list of foods and beverages you can eat. Contact a dietitian for more information. Foods to avoid The items listed may not be a complete list. Talk with your dietitian about what dietary choices are best for you. Fruits Fruits that are dried with sodium-containing preservatives. Vegetables Canned vegetables. Frozen vegetables with sauce or seasonings. Creamed vegetables. French fries. Onion rings. Pickled vegetables and sauerkraut. Grains Bread with more than 80 mg of sodium per slice. Hot or cold cereal with more than 140 mg sodium per serving. Salted pretzels and crackers. Pre-packaged breadcrumbs. Bagels, croissants, and biscuits. Meats  and other protein foods Ribs and chicken wings. Bacon, ham, pepperoni, bologna, salami, and packaged luncheon meats. Hot dogs, bratwurst, and sausage. Canned meat. Smoked meat and fish. Salted nuts and seeds. Dairy Whole milk, half-and-half, and cream. Buttermilk. Processed cheese, cheese spreads, and cheese curds. Regular cottage cheese. Feta cheese. Shredded cheese. String cheese. Fats and oils Butter, lard, shortening, ghee, and bacon fat. Canned and packaged gravies. Seasoning and other foods Onion salt, garlic salt, table salt, and sea salt. Marinades. Regular salad dressings. Relishes, pickles, and olives. Meat flavorings and tenderizers, and bouillon cubes. Horseradish, ketchup, and mustard. Worcestershire sauce. Teriyaki sauce, soy sauce (including reduced sodium). Hot sauce and Tabasco sauce. Steak sauce, fish sauce, oyster sauce, and cocktail sauce. Taco seasonings. Barbecue sauce. Tartar sauce. The items listed above may not be a complete list of foods and beverages you should avoid. Contact a dietitian for more information. Summary  A heart failure eating plan includes changes that limit your intake of sodium and unhealthy fat, and it may help you lose weight or maintain a healthy weight. Your health care provider may also recommend limiting how much fluid you drink.  Most people with heart failure should eat no more than 2,300 mg of salt (sodium) a day. The amount of sodium   that is recommended for you may be lower, depending on your condition.  Contact your health care provider or dietitian before making any major changes to your diet. This information is not intended to replace advice given to you by your health care provider. Make sure you discuss any questions you have with your health care provider. Document Revised: 05/20/2018 Document Reviewed: 08/08/2016 Elsevier Patient Education  2020 Elsevier Inc.  

## 2019-07-18 NOTE — Assessment & Plan Note (Signed)
Chronic, ongoing issue since Covid.  O2 dependent at this time.  Followed by pulmonary. Continue current medication regimen as prescribed by them.  Continue to collaborate with pulmonary and home O2 therapy.

## 2019-07-18 NOTE — Assessment & Plan Note (Signed)
Continue to collaborate with pulmonary and cardiology due to new diagnosis of HF and interstitial lung disease since contracting the virus.  Recent CBC performed by cardiology.

## 2019-07-19 ENCOUNTER — Other Ambulatory Visit: Payer: Self-pay | Admitting: Nurse Practitioner

## 2019-07-19 DIAGNOSIS — E1159 Type 2 diabetes mellitus with other circulatory complications: Secondary | ICD-10-CM

## 2019-07-19 DIAGNOSIS — Z8616 Personal history of COVID-19: Secondary | ICD-10-CM

## 2019-07-19 LAB — BASIC METABOLIC PANEL
BUN/Creatinine Ratio: 15 (ref 10–24)
BUN: 13 mg/dL (ref 8–27)
CO2: 24 mmol/L (ref 20–29)
Calcium: 9 mg/dL (ref 8.6–10.2)
Chloride: 98 mmol/L (ref 96–106)
Creatinine, Ser: 0.89 mg/dL (ref 0.76–1.27)
GFR calc Af Amer: 96 mL/min/{1.73_m2} (ref >59)
GFR calc non Af Amer: 83 mL/min/{1.73_m2} (ref >59)
Glucose: 265 mg/dL — ABNORMAL HIGH (ref 65–99)
Potassium: 4.7 mmol/L (ref 3.5–5.2)
Sodium: 139 mmol/L (ref 134–144)

## 2019-07-19 NOTE — Progress Notes (Signed)
Contacted via MyChart

## 2019-07-22 ENCOUNTER — Ambulatory Visit (INDEPENDENT_AMBULATORY_CARE_PROVIDER_SITE_OTHER): Payer: Medicare HMO | Admitting: Pharmacist

## 2019-07-22 DIAGNOSIS — I5020 Unspecified systolic (congestive) heart failure: Secondary | ICD-10-CM

## 2019-07-22 DIAGNOSIS — E1159 Type 2 diabetes mellitus with other circulatory complications: Secondary | ICD-10-CM

## 2019-07-22 NOTE — Patient Instructions (Signed)
Visit Information  Goals Addressed            This Visit's Progress     Patient Stated   . PharmD "I want to get back to being healthy" (pt-stated)       Current Barriers:  . Polypharmacy; complex patient with multiple comorbidities including T2DM, CAD (CABG in 1991, MI in 1996, 2 stents in 2002); HFrEF (EF 25-30%); s/p COVID pneumonia; admitted at [facility] 01/24/2019-01/29/2019,  o HFrEF: New onset since COVID dx; follows w/ Dr. Lars Pinks at [facility]. Entresto 24/26 mg BID, metoprolol succinate 50 mg daily, furosemide 20 mg daily. ICD placement scheduled for Monday o CAD/ASCVD risk reduction: ASA 81 mg daily, atorvastatin 80 mg daily; last LDL at goal <70 o T2DM: metformin 500 mg QAM, 1000 mg QPM, Jardiance 25 mg daily; last A1c 8.9%. Patient endorses low carb diet o S/p COVID: Follows w/ pulmonology Dr. Welton Flakes. Continues on Symbicort 160/4.5 BID, albuterol PRN   Pharmacist Clinical Goal(s):  [name] Over the next 90 days, patient will work with PharmD and provider towards optimized medication management  Interventions: . Comprehensive medication review performed, medication list updated in electronic medical record . Inter-disciplinary care team collaboration (see longitudinal plan of care) . Discussed increase in A1c w/ PCP. Review of literature of hyperglycemia post COVID. Some thought as to relation to beta cell damage; c-peptide and GAD ordered to help guide therapy. Likely will start basal insulin, as we do not want to perpetuate weight loss w/ GLP1. Pioglitazone not an option d/c HF. Will discuss s/p ICD placement.  . Patient noted that Sherryll Burger cost is now >$150. Likely he has hit coverage gap. Will collaborate w/ team, CPhT on patient assistance. Patient would like to wait to start this process after ICD placement.  Patient Self Care Activities:  . Patient will take medications as prescribed  Please see past updates related to this goal by clicking on the "Past Updates" button in the selected goal          Patient verbalizes understanding of instructions provided today.   Plan:  - Scheduled f/u call 08/03/19  Catie Feliz Beam, PharmD, [facility] Clinical Pharmacist [facility] [phone]

## 2019-07-22 NOTE — Chronic Care Management (AMB) (Signed)
Chronic Care Management   Follow Up Note   07/22/2019 Name: Matthew Wolf MRN: [id number] DOB: September 06, 1945  Referred by: Marjie Skiff, NP Reason for referral : Chronic Care Management (Medication Management)   Matthew Wolf is a 74 y.o. year old male who is a primary care patient of Cannady, Dorie Rank, NP. The CCM team was consulted for assistance with chronic disease management and care coordination needs.    Contacted patient for medication management review.  Review of patient status, including review of consultants reports, relevant laboratory and other test results, and collaboration with appropriate care team members and the patient's provider was performed as part of comprehensive patient evaluation and provision of chronic care management services.    SDOH (Social Determinants of Health) assessments performed: No See Care Plan activities for detailed interventions related to [facility])     Outpatient Encounter Medications as of 07/22/2019  Medication Sig  . empagliflozin (JARDIANCE) 25 MG TABS tablet Take 25 mg by mouth daily before breakfast.  . metFORMIN (GLUCOPHAGE) 500 MG tablet Take 500 MG by mouth (one tablet) at noon and 1000 MG by mouth (2 tablets) at 1800.  [name] albuterol (VENTOLIN HFA) 108 (90 Base) MCG/ACT inhaler TAKE 2 PUFFS BY MOUTH EVERY 6 HOURS AS NEEDED FOR WHEEZE OR SHORTNESS OF BREATH  . aspirin EC 81 MG tablet Take 81 mg by mouth daily.   [name] atorvastatin (LIPITOR) 80 MG tablet Take 80 mg by mouth at bedtime.   . furosemide (LASIX) 20 MG tablet Take one tab daily by mouth for 2-3 days as needed for ankle swelling.  . magnesium oxide (MAG-OX) 400 MG tablet Take 400 mg by mouth daily.  . metoprolol succinate (TOPROL-XL) 50 MG 24 hr tablet Take 50 mg by mouth Nightly. Take at bedtime.  . mupirocin ointment (BACTROBAN) 2 % Place 1 application into the nose 2 (two) times daily.  . naproxen sodium (ALEVE) 220 MG tablet Take 220 mg by mouth 2 (two) times daily as needed  (pain).  . nortriptyline (PAMELOR) 25 MG capsule TAKE 1 CAPSULE (25 MG TOTAL) BY MOUTH 2 (TWO) TIMES DAILY.  [name] Omega-3 Fatty Acids (FISH OIL PO) Take 1 capsule by mouth daily.   . sacubitril-valsartan (ENTRESTO) 24-26 MG Take 1 tablet by mouth 2 (two) times daily.  . SYMBICORT 160-4.5 MCG/ACT inhaler TAKE 2 PUFFS BY MOUTH TWICE A DAY   No facility-administered encounter medications on file as of 07/22/2019.     Objective:   Goals Addressed            This Visit's Progress     Patient Stated   . PharmD "I want to get back to being healthy" (pt-stated)       Current Barriers:  . Polypharmacy; complex patient with multiple comorbidities including T2DM, CAD (CABG in 1991, MI in 1996, 2 stents in 2002); HFrEF (EF 25-30%); s/p COVID pneumonia; admitted at [facility] 01/24/2019-01/29/2019,  o HFrEF: New onset since COVID dx; follows w/ Dr. Lars Pinks at [facility]. Entresto 24/26 mg BID, metoprolol succinate 50 mg daily, furosemide 20 mg daily. ICD placement scheduled for Monday o CAD/ASCVD risk reduction: ASA 81 mg daily, atorvastatin 80 mg daily; last LDL at goal <70 o T2DM: metformin 500 mg QAM, 1000 mg QPM, Jardiance 25 mg daily; last A1c 8.9%. Patient endorses low carb diet o S/p COVID: Follows w/ pulmonology Dr. Welton Flakes. Continues on Symbicort 160/4.5 BID, albuterol PRN   Pharmacist Clinical Goal(s):  [name] Over the next 90 days, patient will  work with PharmD and provider towards optimized medication management  Interventions: . Comprehensive medication review performed, medication list updated in electronic medical record . Inter-disciplinary care team collaboration (see longitudinal plan of care) . Discussed increase in A1c w/ PCP. Review of literature of hyperglycemia post COVID. Some thought as to relation to beta cell damage; c-peptide and GAD ordered to help guide therapy. Likely will start basal insulin, as we do not want to perpetuate weight loss w/ GLP1. Pioglitazone not an option d/c HF. Will discuss  s/p ICD placement.  . Patient noted that Delene Loll cost is now >$150. Likely he has hit coverage gap. Will collaborate w/ team, CPhT on patient assistance. Patient would like to wait to start this process after ICD placement.  Patient Self Care Activities:  . Patient will take medications as prescribed  Please see past updates related to this goal by clicking on the "Past Updates" button in the selected goal          Plan:  - Scheduled f/u call 08/03/19  Catie Darnelle Maffucci, PharmD, [place] [phone]

## 2019-07-23 DIAGNOSIS — I5020 Unspecified systolic (congestive) heart failure: Secondary | ICD-10-CM | POA: Diagnosis not present

## 2019-07-23 DIAGNOSIS — Z20822 Contact with and (suspected) exposure to covid-19: Secondary | ICD-10-CM | POA: Diagnosis not present

## 2019-07-25 DIAGNOSIS — Z9861 Coronary angioplasty status: Secondary | ICD-10-CM | POA: Diagnosis not present

## 2019-07-25 DIAGNOSIS — Z87891 Personal history of nicotine dependence: Secondary | ICD-10-CM | POA: Diagnosis not present

## 2019-07-25 DIAGNOSIS — I2510 Atherosclerotic heart disease of native coronary artery without angina pectoris: Secondary | ICD-10-CM | POA: Diagnosis not present

## 2019-07-25 DIAGNOSIS — E785 Hyperlipidemia, unspecified: Secondary | ICD-10-CM | POA: Diagnosis not present

## 2019-07-25 DIAGNOSIS — Z9981 Dependence on supplemental oxygen: Secondary | ICD-10-CM | POA: Diagnosis not present

## 2019-07-25 DIAGNOSIS — I483 Typical atrial flutter: Secondary | ICD-10-CM | POA: Diagnosis not present

## 2019-07-25 DIAGNOSIS — I110 Hypertensive heart disease with heart failure: Secondary | ICD-10-CM | POA: Diagnosis not present

## 2019-07-25 DIAGNOSIS — I252 Old myocardial infarction: Secondary | ICD-10-CM | POA: Diagnosis not present

## 2019-07-25 DIAGNOSIS — E119 Type 2 diabetes mellitus without complications: Secondary | ICD-10-CM | POA: Diagnosis not present

## 2019-07-25 DIAGNOSIS — I5022 Chronic systolic (congestive) heart failure: Secondary | ICD-10-CM | POA: Diagnosis not present

## 2019-07-25 DIAGNOSIS — I255 Ischemic cardiomyopathy: Secondary | ICD-10-CM | POA: Diagnosis not present

## 2019-07-25 DIAGNOSIS — Z951 Presence of aortocoronary bypass graft: Secondary | ICD-10-CM | POA: Diagnosis not present

## 2019-07-25 DIAGNOSIS — Z7984 Long term (current) use of oral hypoglycemic drugs: Secondary | ICD-10-CM | POA: Diagnosis not present

## 2019-07-25 DIAGNOSIS — J984 Other disorders of lung: Secondary | ICD-10-CM | POA: Diagnosis not present

## 2019-07-26 DIAGNOSIS — I4892 Unspecified atrial flutter: Secondary | ICD-10-CM | POA: Diagnosis not present

## 2019-07-26 DIAGNOSIS — I5020 Unspecified systolic (congestive) heart failure: Secondary | ICD-10-CM | POA: Diagnosis not present

## 2019-07-26 DIAGNOSIS — Z951 Presence of aortocoronary bypass graft: Secondary | ICD-10-CM | POA: Diagnosis not present

## 2019-07-26 DIAGNOSIS — Z95810 Presence of automatic (implantable) cardiac defibrillator: Secondary | ICD-10-CM | POA: Diagnosis not present

## 2019-07-26 DIAGNOSIS — R918 Other nonspecific abnormal finding of lung field: Secondary | ICD-10-CM | POA: Diagnosis not present

## 2019-07-26 DIAGNOSIS — Z9889 Other specified postprocedural states: Secondary | ICD-10-CM | POA: Diagnosis not present

## 2019-07-26 DIAGNOSIS — J90 Pleural effusion, not elsewhere classified: Secondary | ICD-10-CM | POA: Diagnosis not present

## 2019-07-26 DIAGNOSIS — I471 Supraventricular tachycardia: Secondary | ICD-10-CM | POA: Insufficient documentation

## 2019-07-30 DIAGNOSIS — U071 COVID-19: Secondary | ICD-10-CM | POA: Diagnosis not present

## 2019-08-01 ENCOUNTER — Other Ambulatory Visit: Payer: Self-pay | Admitting: Nurse Practitioner

## 2019-08-03 ENCOUNTER — Other Ambulatory Visit: Payer: Self-pay

## 2019-08-03 ENCOUNTER — Ambulatory Visit: Payer: Self-pay | Admitting: Pharmacist

## 2019-08-03 ENCOUNTER — Encounter: Payer: Self-pay | Admitting: Nurse Practitioner

## 2019-08-03 ENCOUNTER — Ambulatory Visit (INDEPENDENT_AMBULATORY_CARE_PROVIDER_SITE_OTHER): Payer: Medicare HMO | Admitting: Nurse Practitioner

## 2019-08-03 VITALS — BP 101/70 | HR 88 | Temp 97.9°F

## 2019-08-03 DIAGNOSIS — L84 Corns and callosities: Secondary | ICD-10-CM

## 2019-08-03 DIAGNOSIS — I5020 Unspecified systolic (congestive) heart failure: Secondary | ICD-10-CM

## 2019-08-03 DIAGNOSIS — E538 Deficiency of other specified B group vitamins: Secondary | ICD-10-CM

## 2019-08-03 DIAGNOSIS — Z95810 Presence of automatic (implantable) cardiac defibrillator: Secondary | ICD-10-CM | POA: Insufficient documentation

## 2019-08-03 DIAGNOSIS — J849 Interstitial pulmonary disease, unspecified: Secondary | ICD-10-CM

## 2019-08-03 DIAGNOSIS — I10 Essential (primary) hypertension: Secondary | ICD-10-CM | POA: Diagnosis not present

## 2019-08-03 DIAGNOSIS — I2510 Atherosclerotic heart disease of native coronary artery without angina pectoris: Secondary | ICD-10-CM | POA: Diagnosis not present

## 2019-08-03 DIAGNOSIS — E1142 Type 2 diabetes mellitus with diabetic polyneuropathy: Secondary | ICD-10-CM

## 2019-08-03 DIAGNOSIS — E1159 Type 2 diabetes mellitus with other circulatory complications: Secondary | ICD-10-CM

## 2019-08-03 MED ORDER — INSULIN DEGLUDEC 100 UNIT/ML ~~LOC~~ SOPN: 10.0000 [IU] | PEN_INJECTOR | Freq: Every day | SUBCUTANEOUS | 5 refills | Status: AC

## 2019-08-03 MED ORDER — INSULIN PEN NEEDLE 32G X 4 MM MISC: 1.0000 | Freq: Every day | 5 refills | Status: DC

## 2019-08-03 NOTE — Patient Instructions (Addendum)
It was great meeting you in person today, Mr. Gaunt!  We are going to try to apply for patient assistance for Ferne Coe, Symbicort, and Tresiba (the new insulin). Please bring back:   1) Copy of your 2020 tax return  2) Print out from the pharmacy of how much your copay for Vania Rea is 3) Print out from the pharmacy of how much your copay for Symbicort is    Start Tresiba 10 units daily.   Please keep checking your blood sugars 1) First thing in the morning, fasting and 2) about 2 hours after the largest meal of your day. Write down these readings in your calendar to review moving forward.   Call with any questions!  Visit Information  Goals Addressed            This Visit's Progress     Patient Stated   . PharmD "I want to get back to being healthy" (pt-stated)       Current Barriers:  . Polypharmacy; complex patient with multiple comorbidities including T2DM, CAD (CABG in 1991, [place] in 1996, 2 stents in 2002); HFrEF (most recent EF 15%); s/p COVID pneumonia; admitted at [facility] 01/24/2019-01/29/2019,  . Financial concerns: notes that his copays for Ferne Coe, and Symbicort are now very expensive  o HFrEF: New onset since COVID dx; follows w/ Dr. Lillia Corporal at [facility]. Entresto 24/26 mg BID, metoprolol succinate 50 mg daily, furosemide 20 mg daily PRN. S/p ICD placement 07/25/19. Reports that he is unsure about the device, as his HR spiked while still in the hospital. Notes he hasn't "felt" anything since placement o CAD/ASCVD risk reduction: ASA 81 mg daily, atorvastatin 80 mg daily; last LDL at goal <70 - Worries about increased in foot swelling. Notes yesterday was so swollen that they were "red and shiny", reports "black spot" on bottom of each heel. Swelling worse x1 month. Sore under bottom of heel and front where toes are. Called cardiology this afternoon regarding this problem, and they recommended he see PCP for evaluation - Has been taking furosemide daily  - Lack of  understanding of purpose of diabetic shoes - complains about lack of pain relief since starting to wear DM shoes last week - Some loss of sensation, somewhat wobbly when walking down the clinic hallway o T2DM: metformin 500 mg QAM, 1000 mg QPM, Jardiance 25 mg daily; last A1c 8.9%. Patient endorses low carb diet - Previously discussed addition of low dose basal insulin. Avoiding GLP1 d/t significant weight loss over the past year o S/p COVID: Follows w/ pulmonology Dr. Humphrey Rolls. Continues on Symbicort 160/4.5 BID, albuterol PRN. Reports that he doesn't derive much benefit from Symbicort; feels some bronchodilatory effect immediately after taking albuterol, but doesn't last   Pharmacist Clinical Goal(s):  [name] Over the next 90 days, patient will work with PharmD and provider towards optimized medication management  Intervention: . Comprehensive medication review performed, medication list updated in electronic medical record . Inter-disciplinary care team collaboration (see longitudinal plan of care) . Collaborated w/ office staff, was able to meet face to face w/ work in appointment w/ PCP . Started Tresiba 10 units daily. Provided with sample and sample pen needles. Administration technique demonstrated and teach back method employed . PCP placed referral for PT, continued consideration of cardiac rehab, and consideration to wear compression hose . Reviewed income. Patient should qualify for assistance for [name] and Delene Loll based on income, should qualify for Vania Rea now that he is in the [employer] Coverage Gap, and  may qualify for Symbicort. Completed his portion of applications today. He will bring back copy of 2020 tax return and proof of income for both Jardiance and Symbicort. Will collaborate w/ CPhT to fax provider portion of Symbicort app to pulmonary Dr. Nickolas Madrid at [facility] and [place] app to Dr Hortencia Conradi at [facility]. Will collaborate w/ PCP for app for  [place].  . Will collaborate w/ RN CM for continued disease management support  Patient Self Care Activities:  . Patient will take medications as prescribed  Please see past updates related to this goal by clicking on the "Past Updates" button in the selected goal         Patient verbalizes understanding of instructions provided today.   Plan:  - Will collaborate w/ patient, providers, and CPhT as above  Catie Feliz Beam, PharmD, [facility] Clinical Pharmacist [facility] [phone]

## 2019-08-03 NOTE — Assessment & Plan Note (Signed)
Chronic, ongoing.  Refer to Type 2 Diabetes with cardiac complications for diabetes medication changes. Check B12 level today.  If normal, will consider addition of low dose Gabapentin, but monitor for increased edema with this.  PT referral to work on gait and strengthening.

## 2019-08-03 NOTE — Assessment & Plan Note (Signed)
Recently placed, continue collaboration with cardiology.

## 2019-08-03 NOTE — Patient Instructions (Signed)

## 2019-08-03 NOTE — Progress Notes (Signed)
BP 101/70   Pulse 88   Temp 97.9 F (36.6 C) (Oral)   SpO2 92%    Subjective:    Patient ID: Matthew Wolf, male    DOB: 1945/12/07, 74 y.o.   MRN: [id number]  HPI: Matthew Wolf is a 74 y.o. male  Chief Complaint  Patient presents with  . Blister    pt states he has been having blisters and pain on the bottom of his feet, states they have been hurting for about a month    FOOT BLISTERS He reports noticing "blisters" on bottom of feet on occasion that have been present for a month + ongoing issues with neuropathy discomfort.  States since his recent ICD placement he has also noted swelling in his lower legs bilaterally and is taking the Lasix cardiology ordered as needed, which is helping.  Recent A1C 8.7 on 07/18/19 -- have discussed with patient initiating insulin at low dose due to poor diabetes control post Covid, CCM team at bedside today.  He agrees with initiation of insulin. Duration: weeks Involved foot: bilateral Mechanism of injury: unknown Location: bilateral feet Weakness with weight bearing or walking: no Morning stiffness: no Swelling: yes Redness: no Bruising: no Paresthesias / decreased sensation: yes  Fevers:no  Relevant past medical, surgical, family and social history reviewed and updated as indicated. Interim medical history since our last visit reviewed. Allergies and medications reviewed and updated.  Review of Systems  Constitutional: Negative for activity change, diaphoresis, fatigue and fever.  Respiratory: Negative for cough, chest tightness, shortness of breath and wheezing.   Cardiovascular: Positive for leg swelling. Negative for chest pain and palpitations.  Psychiatric/Behavioral: Negative.     Per HPI unless specifically indicated above     Objective:    BP 101/70   Pulse 88   Temp 97.9 F (36.6 C) (Oral)   SpO2 92%   Wt Readings from Last 3 Encounters:  07/18/19 130 lb (59 kg)  06/23/19 135 lb 6.4 oz (61.4 kg)  06/01/19 131  lb 6.4 oz (59.6 kg)    Physical Exam Vitals and nursing note reviewed.  Constitutional:      General: He is awake. He is not in acute distress.    Appearance: He is well-developed and well-groomed. He is not ill-appearing.  HENT:     Head: Normocephalic and atraumatic.     Right Ear: Hearing normal. No drainage.     Left Ear: Hearing normal. No drainage.  Eyes:     General: Lids are normal.        Right eye: No discharge.        Left eye: No discharge.     Conjunctiva/sclera: Conjunctivae normal.     Pupils: Pupils are equal, round, and reactive to light.  Neck:     Vascular: No carotid bruit.  Cardiovascular:     Rate and Rhythm: Normal rate and regular rhythm.     Pulses:          Dorsalis pedis pulses are 1+ on the right side and 1+ on the left side.       Posterior tibial pulses are 1+ on the right side and 1+ on the left side.     Heart sounds: Normal heart sounds, S1 normal and S2 normal. No murmur. No gallop.   Pulmonary:     Effort: Pulmonary effort is normal. No accessory muscle usage or respiratory distress.     Breath sounds: Normal breath sounds.  Abdominal:  General: Bowel sounds are normal.     Palpations: Abdomen is soft.  Musculoskeletal:        General: Normal range of motion.     Cervical back: Normal range of motion and neck supple.     Right lower leg: 1+ Pitting Edema present.     Left lower leg: 1+ Pitting Edema present.  Feet:     Right foot:     Protective Sensation: 5 sites tested. 10 sites sensed.     Skin integrity: Callus present.     Toenail Condition: Right toenails are normal.     Left foot:     Protective Sensation: 5 sites tested. 10 sites sensed.     Skin integrity: Callus present.     Toenail Condition: Left toenails are normal.     Comments: Decreased hair pattern BLE.  Left heel, with small, pinpoint area of dark callus, firm, skin intact.  Calluses present to upper pedal aspect left and right foot + one small dime size callus right  heel. Skin:    General: Skin is warm and dry.     Capillary Refill: Capillary refill takes less than 2 seconds.  Neurological:     Mental Status: He is alert and oriented to person, place, and time.     Deep Tendon Reflexes: Reflexes are normal and symmetric.  Psychiatric:        Attention and Perception: Attention normal.        Mood and Affect: Mood normal.        Speech: Speech normal.        Behavior: Behavior normal. Behavior is cooperative.        Thought Content: Thought content normal.     Results for orders placed or performed in visit on 07/18/19  Bayer DCA Hb A1c Waived  Result Value Ref Range   HB A1C (BAYER DCA - WAIVED) 8.7 (H) <7.0 %  Basic metabolic panel  Result Value Ref Range   Glucose 265 (H) 65 - 99 mg/dL   BUN 13 8 - 27 mg/dL   Creatinine, Ser 0.27 0.76 - 1.27 mg/dL   GFR calc non Af Amer 83 >59 mL/min/1.73   GFR calc Af Amer 96 >59 mL/min/1.73   BUN/Creatinine Ratio 15 10 - 24   Sodium 139 134 - 144 mmol/L   Potassium 4.7 3.5 - 5.2 mmol/L   Chloride 98 96 - 106 mmol/L   CO2 24 20 - 29 mmol/L   Calcium 9.0 8.6 - 10.2 mg/dL  Microalbumin, Urine Waived  Result Value Ref Range   Microalb, Ur Waived 30 (H) 0 - 19 mg/L   Creatinine, Urine Waived 10 10 - 300 mg/dL   Microalb/Creat Ratio 30-300 (H) <30 mg/g      Assessment & Plan:   Problem List Items Addressed This Visit      Cardiovascular and Mediastinum   Type 2 diabetes mellitus with cardiac complication (HCC) - Primary    Chronic, ongoing with new diagnosis of HF since Covid. Continue collaboration with cardiology and current medication regimen as prescribed by them -- recommend taking Lasix daily for now due to edema + wearing compression hose.   A1C last visit elevated at 8.7%.  May benefit from GLP in future for A1C control, but concern about weight loss in this patient.   Continue Metformin at current dose, can not tolerate higher/full dosing, and Jardiance.  Start Tresiba 10 units daily, script  sent.  Recommend he monitor BS at least two  times daily.  BMP today.  Return in 4 weeks.      Relevant Medications   insulin degludec (TRESIBA) 100 UNIT/ML FlexTouch Pen     Endocrine   Diabetic peripheral neuropathy (HCC)    Chronic, ongoing.  Refer to Type 2 Diabetes with cardiac complications for diabetes medication changes. Check B12 level today.  If normal, will consider addition of low dose Gabapentin, but monitor for increased edema with this.  PT referral to work on gait and strengthening.      Relevant Medications   insulin degludec (TRESIBA) 100 UNIT/ML FlexTouch Pen   Other Relevant Orders   Basic metabolic panel   Vitamin B12   Ambulatory referral to Physical Therapy   Ambulatory referral to Podiatry     Musculoskeletal and Integument   Callus of heel    Referral to podiatry for further assessment and skin care, would benefit from regular diabetic foot exams with podiatry.  Concern for skin breakdown and wounds with diabetes.      Relevant Orders   Ambulatory referral to Podiatry     Other   ICD (implantable cardioverter-defibrillator) in place    Recently placed, continue collaboration with cardiology.       Other Visit Diagnoses    B12 deficiency       Check level today, reports history of low.  Is on long term Metformin and has neuropathy pain.  If low level, start supplement.   Relevant Orders   Vitamin B12       Follow up plan: Return in about 4 weeks (around 08/31/2019) for Diabetes.

## 2019-08-03 NOTE — Assessment & Plan Note (Addendum)
Chronic, ongoing with new diagnosis of HF since Covid. Continue collaboration with cardiology and current medication regimen as prescribed by them -- recommend taking Lasix daily for now due to edema + wearing compression hose.   A1C last visit elevated at 8.7%.  May benefit from GLP in future for A1C control, but concern about weight loss in this patient.   Continue Metformin at current dose, can not tolerate higher/full dosing, and Jardiance.  Start Tresiba 10 units daily, script sent.  Recommend he monitor BS at least two times daily.  BMP today.  Return in 4 weeks.

## 2019-08-03 NOTE — Assessment & Plan Note (Addendum)
Referral to podiatry for further assessment and skin care, would benefit from regular diabetic foot exams with podiatry.  Concern for skin breakdown and wounds with diabetes.

## 2019-08-03 NOTE — Chronic Care Management (AMB) (Signed)
Chronic Care Management   Follow Up Note   08/03/2019 Name: Matthew Wolf MRN: [id number] DOB: 03-08-1946  Referred by: [name], NP Reason for referral : Chronic Care Management (Medication Management)   Matthew Wolf is a 74 y.o. year old male who is a primary care patient of Cannady, Barbaraann Faster, NP. The CCM team was consulted for assistance with chronic disease management and care coordination needs.    Contacted patient for scheduled phone appointment, but then met face to face at work in appointment w/ PCP.  Review of patient status, including review of consultants reports, relevant laboratory and other test results, and collaboration with appropriate care team members and the patient's provider was performed as part of comprehensive patient evaluation and provision of chronic care management services.    SDOH (Social Determinants of Health) assessments performed: Yes See Care Plan activities for detailed interventions related to [facility])     Outpatient Encounter Medications as of 08/03/2019  Medication Sig  . empagliflozin (JARDIANCE) 25 MG TABS tablet Take 25 mg by mouth daily before breakfast.  . SYMBICORT 160-4.5 MCG/ACT inhaler TAKE 2 PUFFS BY MOUTH TWICE A DAY  . albuterol (VENTOLIN HFA) 108 (90 Base) MCG/ACT inhaler TAKE 2 PUFFS BY MOUTH EVERY 6 HOURS AS NEEDED FOR WHEEZE OR SHORTNESS OF BREATH  . aspirin EC 81 MG tablet Take 81 mg by mouth daily.   Matthew Wolf atorvastatin (LIPITOR) 80 MG tablet Take 80 mg by mouth at bedtime.   . furosemide (LASIX) 20 MG tablet Take one tab daily by mouth for 2-3 days as needed for ankle swelling.  . magnesium oxide (MAG-OX) 400 MG tablet Take 400 mg by mouth daily.  . metFORMIN (GLUCOPHAGE) 500 MG tablet Take 500 MG by mouth (one tablet) at noon and 1000 MG by mouth (2 tablets) at 1800.  . metoprolol succinate (TOPROL-XL) 50 MG 24 hr tablet Take 50 mg by mouth Nightly. Take at bedtime.  . mupirocin ointment (BACTROBAN) 2 % Place 1  application into the nose 2 (two) times daily.  . naproxen sodium (ALEVE) 220 MG tablet Take 220 mg by mouth 2 (two) times daily as needed (pain).  . nortriptyline (PAMELOR) 25 MG capsule TAKE 1 CAPSULE (25 MG TOTAL) BY MOUTH 2 (TWO) TIMES DAILY.  Matthew Wolf Omega-3 Fatty Acids (FISH OIL PO) Take 1 capsule by mouth daily.   . sacubitril-valsartan (ENTRESTO) 24-26 MG Take 1 tablet by mouth 2 (two) times daily.   No facility-administered encounter medications on file as of 08/03/2019.     Objective:   Goals Addressed            This Visit's Progress     Patient Stated   . PharmD "I want to get back to being healthy" (pt-stated)       Current Barriers:  . Polypharmacy; complex patient with multiple comorbidities including T2DM, CAD (CABG in 1991, [place] in 1996, 2 stents in 2002); HFrEF (most recent EF 15%); s/p COVID pneumonia; admitted at [facility] 01/24/2019-01/29/2019,  . Financial concerns: notes that his copays for Ferne Coe, and Symbicort are now very expensive  o HFrEF: New onset since COVID dx; follows w/ Dr. Lillia Corporal at [facility]. Entresto 24/26 mg BID, metoprolol succinate 50 mg daily, furosemide 20 mg daily PRN. S/p ICD placement 07/25/19. Reports that he is unsure about the device, as his HR spiked while still in the hospital. Notes he hasn't "felt" anything since placement o CAD/ASCVD risk reduction: ASA 81 mg daily, atorvastatin 80 mg daily;  last LDL at goal <70 - Worries about increased in foot swelling. Notes yesterday was so swollen that they were "red and shiny", reports "black spot" on bottom of each heel. Swelling worse x1 month. Sore under bottom of heel and front where toes are. Called cardiology this afternoon regarding this problem, and they recommended he see PCP for evaluation - Has been taking furosemide daily  - Lack of understanding of purpose of diabetic shoes - complains about lack of pain relief since starting to wear DM shoes last week - Some loss of sensation, somewhat wobbly  when walking down the clinic hallway o T2DM: metformin 500 mg QAM, 1000 mg QPM, Jardiance 25 mg daily; last A1c 8.9%. Patient endorses low carb diet - Previously discussed addition of low dose basal insulin. Avoiding GLP1 d/t significant weight loss over the past year o S/p COVID: Follows w/ pulmonology Dr. Humphrey Rolls. Continues on Symbicort 160/4.5 BID, albuterol PRN. Reports that he doesn't derive much benefit from Symbicort; feels some bronchodilatory effect immediately after taking albuterol, but doesn't last   Pharmacist Clinical Goal(s):  Matthew Wolf Over the next 90 days, patient will work with PharmD and provider towards optimized medication management  Intervention: . Comprehensive medication review performed, medication list updated in electronic medical record . Inter-disciplinary care team collaboration (see longitudinal plan of care) . Collaborated w/ office staff, was able to meet face to face w/ work in appointment w/ PCP . Started Tresiba 10 units daily. Provided with sample and sample pen needles. Administration technique demonstrated and teach back method employed . PCP placed referral for PT, continued consideration of cardiac rehab, and consideration to wear compression hose . Reviewed income. Patient should qualify for assistance for [name] and Delene Loll based on income, should qualify for Vania Rea now that he is in the Medicare Coverage Gap, and may qualify for Symbicort. Completed his portion of applications today. He will bring back copy of 2020 tax return and proof of income for both Jardiance and Symbicort. Will collaborate w/ CPhT to fax provider portion of Symbicort app to pulmonary Dr. Heidi Dach at [facility] and [place] app to Dr Mauri Reading at [place]. Will collaborate w/ PCP for app for [place].  . Will collaborate w/ RN CM for continued disease management support  Patient Self Care Activities:  . Patient will take medications as  prescribed  Please see past updates related to this goal by clicking on the "Past Updates" button in the selected goal          Plan:  - Will collaborate w/ patient, providers, and CPhT as above  Catie Darnelle Maffucci, PharmD, [place] [phone]

## 2019-08-04 ENCOUNTER — Other Ambulatory Visit: Payer: Self-pay | Admitting: Nurse Practitioner

## 2019-08-04 DIAGNOSIS — E871 Hypo-osmolality and hyponatremia: Secondary | ICD-10-CM

## 2019-08-04 LAB — BASIC METABOLIC PANEL
BUN/Creatinine Ratio: 18 (ref 10–24)
BUN: 20 mg/dL (ref 8–27)
CO2: 23 mmol/L (ref 20–29)
Calcium: 9.2 mg/dL (ref 8.6–10.2)
Chloride: 93 mmol/L — ABNORMAL LOW (ref 96–106)
Creatinine, Ser: 1.11 mg/dL (ref 0.76–1.27)
GFR calc Af Amer: 74 mL/min/{1.73_m2} (ref >59)
GFR calc non Af Amer: 64 mL/min/{1.73_m2} (ref >59)
Glucose: 272 mg/dL — ABNORMAL HIGH (ref 65–99)
Potassium: 5.1 mmol/L (ref 3.5–5.2)
Sodium: 132 mmol/L — ABNORMAL LOW (ref 134–144)

## 2019-08-04 LAB — VITAMIN B12: Vitamin B-12: 491 pg/mL (ref 232–1245)

## 2019-08-04 MED ORDER — INSULIN PEN NEEDLE 32G X 4 MM MISC: 1.0000 | Freq: Every day | 5 refills | Status: AC

## 2019-08-04 NOTE — Progress Notes (Signed)
Contacted via MyChart Good afternoon Matthew Wolf.  Your labs have returned.   - Glucose was elevated, good that we are starting a little insulin.  I think Covid definitely affected your diabetes as well.   - Your sodium level is a little low at 132.  Please do not take Lasix every day, try taking every other day if swelling present and wear compression hose regularly during daytime hours.  Definitely reach out to your heart doctor immediately if worsening edema.   - Your B12 level is 491, normal, but on lower side of normal.  It may benefit your neuropathy to take Vitamin B12 500 MCG daily.  This you can obtain in any vitamin section. - For neuropathy we could try a low dose of Gabapentin 100 MG at night.  My only concern is sometimes this causes swelling.  When you took it before did you have swelling?  Would you like to try a low dose? Please let me know you received this message. Keep being awesome!! Kindest regards, Keondrick Dilks

## 2019-08-04 NOTE — Telephone Encounter (Signed)
Medication Refill - Medication: Pt's wife called to request more test strips/needles. Pt is out of test strips   Has the patient contacted their pharmacy? Yes.   (Agent: If no, request that the patient contact the pharmacy for the refill.) (Agent: If yes, when and what did the pharmacy advise?)  Preferred Pharmacy (with phone number or street name):  [id number] - [place] - [address]  [address] [place] [address]  Phone: [phone] Fax: [phone]     Agent: Please be advised that RX refills may take up to 3 business days. We ask that you follow-up with your pharmacy.

## 2019-08-04 NOTE — Progress Notes (Signed)
Recheck of sodium

## 2019-08-05 ENCOUNTER — Ambulatory Visit: Payer: Self-pay | Admitting: General Practice

## 2019-08-05 ENCOUNTER — Ambulatory Visit: Payer: Self-pay | Admitting: Pharmacist

## 2019-08-05 DIAGNOSIS — I2510 Atherosclerotic heart disease of native coronary artery without angina pectoris: Secondary | ICD-10-CM | POA: Diagnosis not present

## 2019-08-05 DIAGNOSIS — I5020 Unspecified systolic (congestive) heart failure: Secondary | ICD-10-CM

## 2019-08-05 DIAGNOSIS — I10 Essential (primary) hypertension: Secondary | ICD-10-CM

## 2019-08-05 DIAGNOSIS — I152 Hypertension secondary to endocrine disorders: Secondary | ICD-10-CM

## 2019-08-05 DIAGNOSIS — E1159 Type 2 diabetes mellitus with other circulatory complications: Secondary | ICD-10-CM

## 2019-08-05 DIAGNOSIS — J849 Interstitial pulmonary disease, unspecified: Secondary | ICD-10-CM

## 2019-08-05 NOTE — Patient Instructions (Signed)
Visit Information  Goals Addressed            This Visit's Progress   . RNCM: Pt's wife: "I am trying to help Autry with getting this paperwork right" (pt-stated)       CARE PLAN ENTRY (see longtitudinal plan of care for additional care plan information)  Current Barriers:  . Chronic Disease Management support, education, and care coordination needs related to CHF, CAD, HLD, and DMII  Clinical Goal(s) related to CHF, CAD, HTN, and DMII:  Over the next 120 days, patient will:  . Work with the care management team to address educational, disease management, and care coordination needs  . Begin or continue self health monitoring activities as directed today Measure and record cbg (blood glucose) 2 times daily, Measure and record blood pressure 2 times per week, and adhere to a heart healthy/ADA diet . Call provider office for new or worsened signs and symptoms Blood glucose findings outside established parameters, Blood pressure findings outside established parameters, Weight outside established parameters, Oxygen saturation lower than established parameter, Shortness of breath, and New or worsened symptom related to chronic health conditions . Call care management team with questions or concerns . Verbalize basic understanding of patient centered plan of care established today  Interventions related to CHF, CAD, HTN, and DMII:  . Evaluation of current treatment plans and patient's adherence to plan as established by provider . Assessed patient understanding of disease states . Assessed patient's education and care coordination needs.  The wife called because she was trying to help the patient get the needed paperwork together for medication help. Collaboration with Pharm D.  . Provided disease specific education to patient  . Collaborated with appropriate clinical care team members regarding patient needs . Discussed with the Pharm D, Catie Feliz Beam, concerning the paperwork the patient  needed to bring buy the office to help with getting medication help. The Pharm D was able to help the CM help the patients wife with needed paperwork to submit for review.   Patient Self Care Activities related to CHF, CAD, HTN, and DMII:  . Patient is unable to independently self-manage chronic health conditions  Initial goal documentation        Patient verbalizes understanding of instructions provided today.   The care management team will reach out to the patient again over the next 30 days.   Alto Denver RN, MSN, CCM Community Care Coordinator [place]  [facility] Mobile: [phone]

## 2019-08-05 NOTE — Chronic Care Management (AMB) (Signed)
Chronic Care Management   Follow Up Note   08/05/2019 Name: Matthew Wolf MRN: [id number] DOB: April 10, 1945  Referred by: [name], NP Reason for referral : Chronic Care Management (ation Management)   Matthew Wolf is a 74 y.o. year old male who is a primary care patient of Cannady, Barbaraann Faster, NP. The CCM team was consulted for assistance with chronic disease management and care coordination needs.    Care coordination completed today.   Review of patient status, including review of consultants reports, relevant laboratory and other test results, and collaboration with appropriate care team members and the patient's provider was performed as part of comprehensive patient evaluation and provision of chronic care management services.    SDOH (Social Determinants of Health) assessments performed: Yes See Care Plan activities for detailed interventions related to [facility])     Outpatient Encounter Medications as of 08/05/2019  Medication Sig  . albuterol (VENTOLIN HFA) 108 (90 Base) MCG/ACT inhaler TAKE 2 PUFFS BY MOUTH EVERY 6 HOURS AS NEEDED FOR WHEEZE OR SHORTNESS OF BREATH  . aspirin EC 81 MG tablet Take 81 mg by mouth daily.   [name] atorvastatin (LIPITOR) 80 MG tablet Take 80 mg by mouth at bedtime.   . empagliflozin (JARDIANCE) 25 MG TABS tablet Take 25 mg by mouth daily before breakfast.  . furosemide (LASIX) 20 MG tablet Take one tab daily by mouth for 2-3 days as needed for ankle swelling.  . insulin degludec (TRESIBA) 100 UNIT/ML FlexTouch Pen Inject 0.1 mLs (10 Units total) into the skin daily.  . Insulin Pen Needle 32G X 4 MM MISC 1 kit by Does not apply route daily.  . magnesium oxide (MAG-OX) 400 MG tablet Take 400 mg by mouth daily.  . metFORMIN (GLUCOPHAGE) 500 MG tablet Take 500 MG by mouth (one tablet) at noon and 1000 MG by mouth (2 tablets) at 1800.  . metoprolol succinate (TOPROL-XL) 50 MG 24 hr tablet Take 50 mg by mouth Nightly. Take at bedtime.  . mupirocin  ointment (BACTROBAN) 2 % Place 1 application into the nose 2 (two) times daily.  . naproxen sodium (ALEVE) 220 MG tablet Take 220 mg by mouth 2 (two) times daily as needed (pain).  . nortriptyline (PAMELOR) 25 MG capsule TAKE 1 CAPSULE (25 MG TOTAL) BY MOUTH 2 (TWO) TIMES DAILY.  [name] Omega-3 Fatty Acids (FISH OIL PO) Take 1 capsule by mouth daily.   . sacubitril-valsartan (ENTRESTO) 24-26 MG Take 1 tablet by mouth 2 (two) times daily.  . SYMBICORT 160-4.5 MCG/ACT inhaler TAKE 2 PUFFS BY MOUTH TWICE A DAY   No facility-administered encounter medications on file as of 08/05/2019.     Objective:   Goals Addressed            This Visit's Progress     Patient Stated   . PharmD "I want to get back to being healthy" (pt-stated)       Current Barriers:  . Polypharmacy; complex patient with multiple comorbidities including T2DM, CAD (CABG in 1991, [place] in 1996, 2 stents in 2002); HFrEF (most recent EF 15%); s/p COVID pneumonia; admitted at [facility] 01/24/2019-01/29/2019,  . Financial concerns: notes that his copays for Ferne Coe, and Symbicort are now very expensive  o HFrEF: New onset since COVID dx; follows w/ Dr. Lillia Corporal at [facility]. Entresto 24/26 mg BID, metoprolol succinate 50 mg daily, furosemide 20 mg daily PRN. S/p ICD placement 07/25/19.  o CAD/ASCVD risk reduction: ASA 81 mg daily, atorvastatin 80 mg  daily; last LDL at goal <70 o T2DM: metformin 500 mg QAM, 1000 mg QPM, Jardiance 25 mg daily; last A1c 8.9%. [name] 10 units daily  o S/p COVID: Follows w/ pulmonology Dr. Humphrey Rolls. Continues on Symbicort 160/4.5 BID, albuterol PRN  Pharmacist Clinical Goal(s):  [name] Over the next 90 days, patient will work with PharmD and provider towards optimized medication management  Intervention: . Comprehensive medication review performed, medication list updated in electronic medical record . Inter-disciplinary care team collaboration (see longitudinal plan of care) . Received patient portion of all 4  applications, as well as proof of income. Collaborated w/ PCP on [place] applications. Passing all parts along to [employer], CPhT to submit once cardiology and pulmonary provider portions received.   Patient Self Care Activities:  . Patient will take medications as prescribed  Please see past updates related to this goal by clicking on the "Past Updates" button in the selected goal          Plan:  = Will collaborate w/ providers and CPhT as above  Catie Darnelle Maffucci, PharmD, [place] [phone]

## 2019-08-05 NOTE — Patient Instructions (Signed)
Visit Information  Goals Addressed            This Visit's Progress     Patient Stated   . PharmD "I want to get back to being healthy" (pt-stated)       Current Barriers:  . Polypharmacy; complex patient with multiple comorbidities including T2DM, CAD (CABG in 1991, MI in 1996, 2 stents in 2002); HFrEF (most recent EF 15%); s/p COVID pneumonia; admitted at [facility] 01/24/2019-01/29/2019,  . Financial concerns: notes that his copays for Valla Leaver, and Symbicort are now very expensive  o HFrEF: New onset since COVID dx; follows w/ Dr. Lars Pinks at [facility] Entresto 24/26 mg BID, metoprolol succinate 50 mg daily, furosemide 20 mg daily PRN. S/p ICD placement 07/25/19.  o CAD/ASCVD risk reduction: ASA 81 mg daily, atorvastatin 80 mg daily; last LDL at goal <70 o T2DM: metformin 500 mg QAM, 1000 mg QPM, Jardiance 25 mg daily; last A1c 8.9%. Evaristo Bury 10 units daily  o S/p COVID: Follows w/ pulmonology Dr. Welton Flakes. Continues on Symbicort 160/4.5 BID, albuterol PRN  Pharmacist Clinical Goal(s):  [name] Over the next 90 days, patient will work with PharmD and provider towards optimized medication management  Intervention: . Comprehensive medication review performed, medication list updated in electronic medical record . Inter-disciplinary care team collaboration (see longitudinal plan of care) . Received patient portion of all 4 applications, as well as proof of income. Collaborated w/ PCP on [place] applications. Passing all parts along to [employer], CPhT to submit once cardiology and pulmonary provider portions received.   Patient Self Care Activities:  . Patient will take medications as prescribed  Please see past updates related to this goal by clicking on the "Past Updates" button in the selected goal         Patient verbalizes understanding of instructions provided today.   Plan:  = Will collaborate w/ providers and CPhT as above  Catie Feliz Beam, PharmD, [facility] Clinical  Pharmacist [facility] [phone]

## 2019-08-05 NOTE — Chronic Care Management (AMB) (Signed)
Chronic Care Management   Follow Up Note   08/05/2019 Name: WYNN ALLDREDGE MRN: [id number] DOB: 05/22/1945  Referred by: [name], NP Reason for referral : Chronic Care Management (RNCM Chronic Disease Management and Care coordination needs )   SAMEUL TAGLE is a 74 y.o. year old male who is a primary care patient of Cannady, Barbaraann Faster, NP. The CCM team was consulted for assistance with chronic disease management and care coordination needs.    Review of patient status, including review of consultants reports, relevant laboratory and other test results, and collaboration with appropriate care team members and the patient's provider was performed as part of comprehensive patient evaluation and provision of chronic care management services.    SDOH (Social Determinants of Health) assessments performed: No See Care Plan activities for detailed interventions related to [facility])     Outpatient Encounter Medications as of 08/05/2019  Medication Sig  . albuterol (VENTOLIN HFA) 108 (90 Base) MCG/ACT inhaler TAKE 2 PUFFS BY MOUTH EVERY 6 HOURS AS NEEDED FOR WHEEZE OR SHORTNESS OF BREATH  . aspirin EC 81 MG tablet Take 81 mg by mouth daily.   [name] atorvastatin (LIPITOR) 80 MG tablet Take 80 mg by mouth at bedtime.   . empagliflozin (JARDIANCE) 25 MG TABS tablet Take 25 mg by mouth daily before breakfast.  . furosemide (LASIX) 20 MG tablet Take one tab daily by mouth for 2-3 days as needed for ankle swelling.  . insulin degludec (TRESIBA) 100 UNIT/ML FlexTouch Pen Inject 0.1 mLs (10 Units total) into the skin daily.  . Insulin Pen Needle 32G X 4 MM MISC 1 kit by Does not apply route daily.  . magnesium oxide (MAG-OX) 400 MG tablet Take 400 mg by mouth daily.  . metFORMIN (GLUCOPHAGE) 500 MG tablet Take 500 MG by mouth (one tablet) at noon and 1000 MG by mouth (2 tablets) at 1800.  . metoprolol succinate (TOPROL-XL) 50 MG 24 hr tablet Take 50 mg by mouth Nightly. Take at bedtime.  . mupirocin  ointment (BACTROBAN) 2 % Place 1 application into the nose 2 (two) times daily.  . naproxen sodium (ALEVE) 220 MG tablet Take 220 mg by mouth 2 (two) times daily as needed (pain).  . nortriptyline (PAMELOR) 25 MG capsule TAKE 1 CAPSULE (25 MG TOTAL) BY MOUTH 2 (TWO) TIMES DAILY.  [name] Omega-3 Fatty Acids (FISH OIL PO) Take 1 capsule by mouth daily.   . sacubitril-valsartan (ENTRESTO) 24-26 MG Take 1 tablet by mouth 2 (two) times daily.  . SYMBICORT 160-4.5 MCG/ACT inhaler TAKE 2 PUFFS BY MOUTH TWICE A DAY   No facility-administered encounter medications on file as of 08/05/2019.     Objective:  BP Readings from Last 3 Encounters:  08/03/19 101/70  07/18/19 104/66  06/23/19 105/64    Goals Addressed            This Visit's Progress   . RNCM: Pt's wife: "I am trying to help Adrik with getting this paperwork right" (pt-stated)       CARE PLAN ENTRY (see longtitudinal plan of care for additional care plan information)  Current Barriers:  . Chronic Disease Management support, education, and care coordination needs related to CHF, CAD, HLD, and DMII  Clinical Goal(s) related to CHF, CAD, HTN, and DMII:  Over the next 120 days, patient will:  . Work with the care management team to address educational, disease management, and care coordination needs  . Begin or continue self health monitoring activities as  directed today Measure and record cbg (blood glucose) 2 times daily, Measure and record blood pressure 2 times per week, and adhere to a heart healthy/ADA diet . Call provider office for new or worsened signs and symptoms Blood glucose findings outside established parameters, Blood pressure findings outside established parameters, Weight outside established parameters, Oxygen saturation lower than established parameter, Shortness of breath, and New or worsened symptom related to chronic health conditions . Call care management team with questions or concerns . Verbalize basic understanding  of patient centered plan of care established today  Interventions related to CHF, CAD, HTN, and DMII:  . Evaluation of current treatment plans and patient's adherence to plan as established by provider . Assessed patient understanding of disease states . Assessed patient's education and care coordination needs.  The wife called because she was trying to help the patient get the needed paperwork together for medication help. Collaboration with Pharm D.  . Provided disease specific education to patient  . Collaborated with appropriate clinical care team members regarding patient needs . Discussed with the Pharm D, Catie Darnelle Maffucci, concerning the paperwork the patient needed to bring buy the office to help with getting medication help. The Pharm D was able to help the CM help the patients wife with needed paperwork to submit for review.   Patient Self Care Activities related to CHF, CAD, HTN, and DMII:  . Patient is unable to independently self-manage chronic health conditions  Initial goal documentation         Plan:   The care management team will reach out to the patient again over the next 30 days.    Noreene Larsson RN, MSN, [facility] Mobile: [phone]

## 2019-08-08 ENCOUNTER — Other Ambulatory Visit: Payer: Self-pay

## 2019-08-08 ENCOUNTER — Emergency Department: Payer: Medicare HMO

## 2019-08-08 ENCOUNTER — Inpatient Hospital Stay
Admission: EM | Admit: 2019-08-08 | Discharge: 2019-08-14 | DRG: 291 | Disposition: A | Discharge status: Other Acute Inpatient Hospital | Payer: Medicare HMO | Attending: Internal Medicine | Admitting: Internal Medicine

## 2019-08-08 DIAGNOSIS — Z825 Family history of asthma and other chronic lower respiratory diseases: Secondary | ICD-10-CM

## 2019-08-08 DIAGNOSIS — Z951 Presence of aortocoronary bypass graft: Secondary | ICD-10-CM

## 2019-08-08 DIAGNOSIS — E1142 Type 2 diabetes mellitus with diabetic polyneuropathy: Secondary | ICD-10-CM | POA: Diagnosis present

## 2019-08-08 DIAGNOSIS — I5023 Acute on chronic systolic (congestive) heart failure: Secondary | ICD-10-CM | POA: Diagnosis present

## 2019-08-08 DIAGNOSIS — Z87891 Personal history of nicotine dependence: Secondary | ICD-10-CM

## 2019-08-08 DIAGNOSIS — E870 Hyperosmolality and hypernatremia: Secondary | ICD-10-CM | POA: Diagnosis not present

## 2019-08-08 DIAGNOSIS — R570 Cardiogenic shock: Secondary | ICD-10-CM | POA: Diagnosis not present

## 2019-08-08 DIAGNOSIS — I493 Ventricular premature depolarization: Secondary | ICD-10-CM | POA: Diagnosis not present

## 2019-08-08 DIAGNOSIS — E1122 Type 2 diabetes mellitus with diabetic chronic kidney disease: Secondary | ICD-10-CM | POA: Diagnosis present

## 2019-08-08 DIAGNOSIS — N400 Enlarged prostate without lower urinary tract symptoms: Secondary | ICD-10-CM | POA: Diagnosis present

## 2019-08-08 DIAGNOSIS — R600 Localized edema: Secondary | ICD-10-CM | POA: Diagnosis not present

## 2019-08-08 DIAGNOSIS — E43 Unspecified severe protein-calorie malnutrition: Secondary | ICD-10-CM | POA: Diagnosis not present

## 2019-08-08 DIAGNOSIS — I130 Hypertensive heart and chronic kidney disease with heart failure and stage 1 through stage 4 chronic kidney disease, or unspecified chronic kidney disease: Principal | ICD-10-CM | POA: Diagnosis present

## 2019-08-08 DIAGNOSIS — E871 Hypo-osmolality and hyponatremia: Secondary | ICD-10-CM | POA: Diagnosis present

## 2019-08-08 DIAGNOSIS — J9601 Acute respiratory failure with hypoxia: Secondary | ICD-10-CM | POA: Diagnosis not present

## 2019-08-08 DIAGNOSIS — I5033 Acute on chronic diastolic (congestive) heart failure: Secondary | ICD-10-CM | POA: Diagnosis not present

## 2019-08-08 DIAGNOSIS — L89626 Pressure-induced deep tissue damage of left heel: Secondary | ICD-10-CM | POA: Diagnosis not present

## 2019-08-08 DIAGNOSIS — I255 Ischemic cardiomyopathy: Secondary | ICD-10-CM | POA: Diagnosis present

## 2019-08-08 DIAGNOSIS — E860 Dehydration: Secondary | ICD-10-CM | POA: Diagnosis present

## 2019-08-08 DIAGNOSIS — Z803 Family history of malignant neoplasm of breast: Secondary | ICD-10-CM

## 2019-08-08 DIAGNOSIS — E875 Hyperkalemia: Secondary | ICD-10-CM | POA: Diagnosis not present

## 2019-08-08 DIAGNOSIS — J8410 Pulmonary fibrosis, unspecified: Secondary | ICD-10-CM | POA: Diagnosis present

## 2019-08-08 DIAGNOSIS — Z452 Encounter for adjustment and management of vascular access device: Secondary | ICD-10-CM | POA: Diagnosis not present

## 2019-08-08 DIAGNOSIS — Z66 Do not resuscitate: Secondary | ICD-10-CM | POA: Diagnosis present

## 2019-08-08 DIAGNOSIS — G4700 Insomnia, unspecified: Secondary | ICD-10-CM | POA: Diagnosis present

## 2019-08-08 DIAGNOSIS — Z95810 Presence of automatic (implantable) cardiac defibrillator: Secondary | ICD-10-CM

## 2019-08-08 DIAGNOSIS — B948 Sequelae of other specified infectious and parasitic diseases: Secondary | ICD-10-CM

## 2019-08-08 DIAGNOSIS — Z955 Presence of coronary angioplasty implant and graft: Secondary | ICD-10-CM

## 2019-08-08 DIAGNOSIS — R64 Cachexia: Secondary | ICD-10-CM | POA: Diagnosis not present

## 2019-08-08 DIAGNOSIS — Z6822 Body mass index (BMI) 22.0-22.9, adult: Secondary | ICD-10-CM | POA: Diagnosis not present

## 2019-08-08 DIAGNOSIS — N390 Urinary tract infection, site not specified: Secondary | ICD-10-CM | POA: Diagnosis not present

## 2019-08-08 DIAGNOSIS — J849 Interstitial pulmonary disease, unspecified: Secondary | ICD-10-CM | POA: Diagnosis not present

## 2019-08-08 DIAGNOSIS — E1159 Type 2 diabetes mellitus with other circulatory complications: Secondary | ICD-10-CM | POA: Diagnosis present

## 2019-08-08 DIAGNOSIS — I959 Hypotension, unspecified: Secondary | ICD-10-CM | POA: Diagnosis not present

## 2019-08-08 DIAGNOSIS — L89816 Pressure-induced deep tissue damage of head: Secondary | ICD-10-CM | POA: Diagnosis not present

## 2019-08-08 DIAGNOSIS — I252 Old myocardial infarction: Secondary | ICD-10-CM | POA: Diagnosis not present

## 2019-08-08 DIAGNOSIS — Z8249 Family history of ischemic heart disease and other diseases of the circulatory system: Secondary | ICD-10-CM

## 2019-08-08 DIAGNOSIS — K761 Chronic passive congestion of liver: Secondary | ICD-10-CM | POA: Diagnosis present

## 2019-08-08 DIAGNOSIS — R0602 Shortness of breath: Secondary | ICD-10-CM | POA: Diagnosis not present

## 2019-08-08 DIAGNOSIS — Z8674 Personal history of sudden cardiac arrest: Secondary | ICD-10-CM

## 2019-08-08 DIAGNOSIS — R5383 Other fatigue: Secondary | ICD-10-CM

## 2019-08-08 DIAGNOSIS — Z950 Presence of cardiac pacemaker: Secondary | ICD-10-CM | POA: Diagnosis not present

## 2019-08-08 DIAGNOSIS — Z515 Encounter for palliative care: Secondary | ICD-10-CM | POA: Diagnosis not present

## 2019-08-08 DIAGNOSIS — R000 Tachycardia, unspecified: Secondary | ICD-10-CM | POA: Diagnosis not present

## 2019-08-08 DIAGNOSIS — K7200 Acute and subacute hepatic failure without coma: Secondary | ICD-10-CM | POA: Diagnosis not present

## 2019-08-08 DIAGNOSIS — I509 Heart failure, unspecified: Secondary | ICD-10-CM

## 2019-08-08 DIAGNOSIS — Z801 Family history of malignant neoplasm of trachea, bronchus and lung: Secondary | ICD-10-CM

## 2019-08-08 DIAGNOSIS — I440 Atrioventricular block, first degree: Secondary | ICD-10-CM | POA: Diagnosis present

## 2019-08-08 DIAGNOSIS — N179 Acute kidney failure, unspecified: Secondary | ICD-10-CM | POA: Diagnosis not present

## 2019-08-08 DIAGNOSIS — E785 Hyperlipidemia, unspecified: Secondary | ICD-10-CM | POA: Diagnosis present

## 2019-08-08 DIAGNOSIS — I5020 Unspecified systolic (congestive) heart failure: Secondary | ICD-10-CM | POA: Diagnosis not present

## 2019-08-08 DIAGNOSIS — N183 Chronic kidney disease, stage 3 unspecified: Secondary | ICD-10-CM | POA: Diagnosis present

## 2019-08-08 DIAGNOSIS — I2510 Atherosclerotic heart disease of native coronary artery without angina pectoris: Secondary | ICD-10-CM | POA: Diagnosis present

## 2019-08-08 DIAGNOSIS — Z833 Family history of diabetes mellitus: Secondary | ICD-10-CM

## 2019-08-08 DIAGNOSIS — Z794 Long term (current) use of insulin: Secondary | ICD-10-CM

## 2019-08-08 DIAGNOSIS — Z7901 Long term (current) use of anticoagulants: Secondary | ICD-10-CM

## 2019-08-08 DIAGNOSIS — Z8616 Personal history of COVID-19: Secondary | ICD-10-CM | POA: Diagnosis not present

## 2019-08-08 DIAGNOSIS — Z79899 Other long term (current) drug therapy: Secondary | ICD-10-CM

## 2019-08-08 DIAGNOSIS — I471 Supraventricular tachycardia: Secondary | ICD-10-CM | POA: Diagnosis not present

## 2019-08-08 DIAGNOSIS — R5381 Other malaise: Secondary | ICD-10-CM | POA: Diagnosis not present

## 2019-08-08 DIAGNOSIS — I499 Cardiac arrhythmia, unspecified: Secondary | ICD-10-CM | POA: Diagnosis not present

## 2019-08-08 DIAGNOSIS — I110 Hypertensive heart disease with heart failure: Secondary | ICD-10-CM | POA: Diagnosis not present

## 2019-08-08 DIAGNOSIS — Z7982 Long term (current) use of aspirin: Secondary | ICD-10-CM

## 2019-08-08 DIAGNOSIS — Z7189 Other specified counseling: Secondary | ICD-10-CM | POA: Diagnosis not present

## 2019-08-08 DIAGNOSIS — R531 Weakness: Secondary | ICD-10-CM | POA: Diagnosis not present

## 2019-08-08 DIAGNOSIS — Z7951 Long term (current) use of inhaled steroids: Secondary | ICD-10-CM

## 2019-08-08 DIAGNOSIS — R0902 Hypoxemia: Secondary | ICD-10-CM | POA: Diagnosis not present

## 2019-08-08 LAB — URINALYSIS, COMPLETE (UACMP) WITH MICROSCOPIC
Glucose, UA: 100 mg/dL — AB
Hgb urine dipstick: NEGATIVE
Nitrite: NEGATIVE
Protein, ur: 100 mg/dL — AB
RBC / HPF: NONE SEEN RBC/hpf (ref 0–5)
Specific Gravity, Urine: 1.025 (ref 1.005–1.030)
Squamous Epithelial / HPF: NONE SEEN (ref 0–5)
pH: 5 (ref 5.0–8.0)

## 2019-08-08 LAB — COMPREHENSIVE METABOLIC PANEL
ALT: 41 U/L (ref 0–44)
AST: 36 U/L (ref 15–41)
Albumin: 3.7 g/dL (ref 3.5–5.0)
Alkaline Phosphatase: 214 U/L — ABNORMAL HIGH (ref 38–126)
Anion gap: 10 (ref 5–15)
BUN: 32 mg/dL — ABNORMAL HIGH (ref 8–23)
CO2: 25 mmol/L (ref 22–32)
Calcium: 8.8 mg/dL — ABNORMAL LOW (ref 8.9–10.3)
Chloride: 93 mmol/L — ABNORMAL LOW (ref 98–111)
Creatinine, Ser: 1.25 mg/dL — ABNORMAL HIGH (ref 0.61–1.24)
GFR calc Af Amer: >60 mL/min (ref >60)
GFR calc non Af Amer: 56 mL/min — ABNORMAL LOW (ref >60)
Glucose, Bld: 157 mg/dL — ABNORMAL HIGH (ref 70–99)
Potassium: 4.9 mmol/L (ref 3.5–5.1)
Sodium: 128 mmol/L — ABNORMAL LOW (ref 135–145)
Total Bilirubin: 1.1 mg/dL (ref 0.3–1.2)
Total Protein: 6.6 g/dL (ref 6.5–8.1)

## 2019-08-08 LAB — CBC
HCT: 39.8 % (ref 39.0–52.0)
Hemoglobin: 13 g/dL (ref 13.0–17.0)
MCH: 31.9 pg (ref 26.0–34.0)
MCHC: 32.7 g/dL (ref 30.0–36.0)
MCV: 97.8 fL (ref 80.0–100.0)
Platelets: 231 10*3/uL (ref 150–400)
RBC: 4.07 MIL/uL — ABNORMAL LOW (ref 4.22–5.81)
RDW: 14.4 % (ref 11.5–15.5)
WBC: 9.6 10*3/uL (ref 4.0–10.5)
nRBC: 0 % (ref 0.0–0.2)

## 2019-08-08 LAB — RESPIRATORY PANEL BY RT PCR (FLU A&B, COVID)
Influenza A by PCR: NEGATIVE
Influenza B by PCR: NEGATIVE
SARS Coronavirus 2 by RT PCR: NEGATIVE

## 2019-08-08 LAB — HEMOGLOBIN A1C
Hgb A1c MFr Bld: 8.8 % — ABNORMAL HIGH (ref 4.8–5.6)
Mean Plasma Glucose: 205.86 mg/dL

## 2019-08-08 LAB — TYPE AND SCREEN
ABO/RH(D): A POS
Antibody Screen: NEGATIVE

## 2019-08-08 LAB — TROPONIN I (HIGH SENSITIVITY)
Troponin I (High Sensitivity): 37 ng/L — ABNORMAL HIGH (ref <18)
Troponin I (High Sensitivity): 35 ng/L — ABNORMAL HIGH (ref <18)

## 2019-08-08 LAB — GLUCOSE, CAPILLARY
Glucose-Capillary: 116 mg/dL — ABNORMAL HIGH (ref 70–99)
Glucose-Capillary: 196 mg/dL — ABNORMAL HIGH (ref 70–99)
Glucose-Capillary: 211 mg/dL — ABNORMAL HIGH (ref 70–99)

## 2019-08-08 LAB — BRAIN NATRIURETIC PEPTIDE: B Natriuretic Peptide: 3198 pg/mL — ABNORMAL HIGH (ref 0.0–100.0)

## 2019-08-08 MED ORDER — MOMETASONE FURO-FORMOTEROL FUM 200-5 MCG/ACT IN AERO
2.0000 | INHALATION_SPRAY | Freq: Two times a day (BID) | RESPIRATORY_TRACT | Status: DC
  Administered 2019-08-08 – 2019-08-13 (×11): 2 via RESPIRATORY_TRACT
  Filled 2019-08-08: qty 8.8

## 2019-08-08 MED ORDER — SODIUM CHLORIDE 0.9 % IV SOLN: INTRAVENOUS | Status: DC

## 2019-08-08 MED ORDER — METOPROLOL SUCCINATE ER 25 MG PO TB24
12.5000 mg | ORAL_TABLET | Freq: Every day | ORAL | Status: DC
  Administered 2019-08-09 – 2019-08-13 (×5): 12.5 mg via ORAL
  Filled 2019-08-08: qty 0.5
  Filled 2019-08-08: qty 1
  Filled 2019-08-08 (×4): qty 0.5

## 2019-08-08 MED ORDER — SODIUM CHLORIDE 0.9% FLUSH: 3.0000 mL | INTRAVENOUS | Status: DC | PRN

## 2019-08-08 MED ORDER — METOPROLOL SUCCINATE ER 50 MG PO TB24: 50.0000 mg | ORAL_TABLET | Freq: Every evening | ORAL | Status: DC

## 2019-08-08 MED ORDER — SODIUM CHLORIDE 0.9% FLUSH
3.0000 mL | Freq: Two times a day (BID) | INTRAVENOUS | Status: DC
  Administered 2019-08-08 – 2019-08-13 (×9): 3 mL via INTRAVENOUS

## 2019-08-08 MED ORDER — INSULIN ASPART 100 UNIT/ML ~~LOC~~ SOLN
0.0000 [IU] | Freq: Three times a day (TID) | SUBCUTANEOUS | Status: DC
  Administered 2019-08-09: 17:00:00 3 [IU] via SUBCUTANEOUS
  Administered 2019-08-09: 5 [IU] via SUBCUTANEOUS
  Administered 2019-08-09 – 2019-08-10 (×2): 3 [IU] via SUBCUTANEOUS
  Administered 2019-08-11: 8 [IU] via SUBCUTANEOUS
  Administered 2019-08-11: 08:00:00 5 [IU] via SUBCUTANEOUS
  Administered 2019-08-11: 8 [IU] via SUBCUTANEOUS
  Administered 2019-08-12: 3 [IU] via SUBCUTANEOUS
  Administered 2019-08-12: 12:00:00 8 [IU] via SUBCUTANEOUS
  Administered 2019-08-12 – 2019-08-13 (×2): 5 [IU] via SUBCUTANEOUS
  Administered 2019-08-13 (×2): 3 [IU] via SUBCUTANEOUS
  Filled 2019-08-08 (×13): qty 1

## 2019-08-08 MED ORDER — ENOXAPARIN SODIUM 40 MG/0.4ML ~~LOC~~ SOLN
40.0000 mg | SUBCUTANEOUS | Status: DC
  Administered 2019-08-08 – 2019-08-10 (×3): 40 mg via SUBCUTANEOUS
  Filled 2019-08-08 (×3): qty 0.4

## 2019-08-08 MED ORDER — SODIUM CHLORIDE 0.9 % IV BOLUS
250.0000 mL | Freq: Once | INTRAVENOUS | Status: AC
  Administered 2019-08-08: 250 mL via INTRAVENOUS

## 2019-08-08 MED ORDER — ACETAMINOPHEN 650 MG RE SUPP: 650.0000 mg | Freq: Four times a day (QID) | RECTAL | Status: DC | PRN

## 2019-08-08 MED ORDER — SACUBITRIL-VALSARTAN 24-26 MG PO TABS: 1.0000 | ORAL_TABLET | Freq: Two times a day (BID) | ORAL | Status: DC

## 2019-08-08 MED ORDER — MAGNESIUM OXIDE 400 (241.3 MG) MG PO TABS
400.0000 mg | ORAL_TABLET | Freq: Every day | ORAL | Status: DC
  Administered 2019-08-08 – 2019-08-13 (×6): 400 mg via ORAL
  Filled 2019-08-08 (×6): qty 1

## 2019-08-08 MED ORDER — OMEGA-3-ACID ETHYL ESTERS 1 G PO CAPS
1.0000 g | ORAL_CAPSULE | Freq: Every day | ORAL | Status: DC
  Administered 2019-08-09 – 2019-08-13 (×5): 1 g via ORAL
  Filled 2019-08-08 (×5): qty 1

## 2019-08-08 MED ORDER — ONDANSETRON HCL 4 MG/2ML IJ SOLN
4.0000 mg | Freq: Four times a day (QID) | INTRAMUSCULAR | Status: DC | PRN
  Administered 2019-08-10: 02:00:00 4 mg via INTRAVENOUS
  Filled 2019-08-08: qty 2

## 2019-08-08 MED ORDER — ATORVASTATIN CALCIUM 20 MG PO TABS
80.0000 mg | ORAL_TABLET | Freq: Every day | ORAL | Status: DC
  Administered 2019-08-08 – 2019-08-11 (×3): 80 mg via ORAL
  Filled 2019-08-08 (×2): qty 4
  Filled 2019-08-08: qty 1

## 2019-08-08 MED ORDER — FUROSEMIDE 20 MG PO TABS
20.0000 mg | ORAL_TABLET | Freq: Every day | ORAL | Status: DC | PRN
  Filled 2019-08-08: qty 1

## 2019-08-08 MED ORDER — ONDANSETRON HCL 4 MG PO TABS: 4.0000 mg | ORAL_TABLET | Freq: Four times a day (QID) | ORAL | Status: DC | PRN

## 2019-08-08 MED ORDER — FUROSEMIDE 40 MG PO TABS: 20.0000 mg | ORAL_TABLET | Freq: Every day | ORAL | Status: DC

## 2019-08-08 MED ORDER — SODIUM CHLORIDE 0.9 % IV SOLN: 250.0000 mL | INTRAVENOUS | Status: DC | PRN

## 2019-08-08 MED ORDER — ALBUTEROL SULFATE (2.5 MG/3ML) 0.083% IN NEBU: 3.0000 mL | INHALATION_SOLUTION | Freq: Four times a day (QID) | RESPIRATORY_TRACT | Status: DC | PRN

## 2019-08-08 MED ORDER — MUPIROCIN 2 % EX OINT: 1.0000 "application " | TOPICAL_OINTMENT | Freq: Two times a day (BID) | CUTANEOUS | Status: DC

## 2019-08-08 MED ORDER — ACETAMINOPHEN 325 MG PO TABS: 650.0000 mg | ORAL_TABLET | Freq: Four times a day (QID) | ORAL | Status: DC | PRN

## 2019-08-08 MED ORDER — ASPIRIN EC 81 MG PO TBEC
81.0000 mg | DELAYED_RELEASE_TABLET | Freq: Every day | ORAL | Status: DC
  Administered 2019-08-08 – 2019-08-13 (×6): 81 mg via ORAL
  Filled 2019-08-08 (×6): qty 1

## 2019-08-08 NOTE — Consult Note (Signed)
CARDIOLOGY CONSULT NOTE               Patient ID: Matthew Wolf MRN: [id number] DOB/AGE: January 03, 1946 74 y.o.  Admit date: 08/08/2019 Referring Physician Joylene Igo, MD Primary Physician Harvest Dark, NP Primary Cardiologist [facility] Reason for Consultation fatigue  HPI: 74 year old gentleman referred for evaluation of fatigue. 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 The patient presented to [facility] ER today due to feeling sluggish, generalized weakness, increased lower extremity edema, insomnia, and poor oral intake over the last several weeks. He states he was unable to sleep for about 24 hours for no particular reason, and decided this morning to come to the ER. Upon review of cardiology office notes, the patient mentioned new bilateral ankle edema at the appointment on 07/14/2019 that was later reported as refractory to furosemide. He states that he continued taking furosemide for about 20 days until he was advised to take it only as-needed. The patient denies increased shortness of breath, abdominal distention, orthopnea, PND, palpitations, or chest pain. Admission labs notable for high-sensitivity troponin 37, followed by 35, BNP 3198, sodium 128, creatinine 1.25, BUN 32, and negative COVID. Chest xray revealed scattered interstitial changes in the periphery of both lungs favoring chronic interstitial lung disease, without acute abnormalities. Systolic blood pressure was in the 90s in the ER, and he was given 250 cc of fluid bolus. ECG revealed sinus rhythm at a  rate of 98 bpm with prior inferior infarct with occasional PVCs. Limited echocardiogram on 07/26/2019 status post ICD implant revealed LVEF less than 15% with no evidence of pericardial effusion.   Review of systems complete and found to be negative unless listed above     Past Medical History:  Diagnosis Date  . BPH (benign prostatic hypertrophy)   . CKD (chronic kidney disease) stage 3, GFR 30-59 ml/min   . Diabetes (HCC)   . Diabetic peripheral neuropathy (HCC)   . HBP (high blood pressure)   . Heart trouble   . Hyperlipidemia   . Hypertensive kidney disease   . MI (myocardial infarction) (HCC) 1991, 1996    Past Surgical History:  Procedure Laterality Date  . ANGIOPLASTY     x 2  . CARDIAC SURGERY    . CATARACT EXTRACTION, BILATERAL  06/2015  . CORONARY ARTERY BYPASS GRAFT  1991  . HERNIA REPAIR      (Not in a hospital admission)  Social History   Socioeconomic History  . Marital status: Married    Spouse name: Not on file  . Number of children: Not on file  . Years of education: Not on file  . Highest education level: 12th grade  Occupational History  . Occupation: Data processing manager    Comment: fulltime   Tobacco Use  . Smoking status: Former Smoker    Types: Cigarettes    Quit date: 04/07/1984    Years since quitting: 35.3  . Smokeless tobacco: Never Used  Substance and Sexual Activity  . Alcohol use: No  . Drug use: No  . Sexual activity: Yes  Other Topics Concern  . Not on file  Social History  Narrative  . Not on file   Social Determinants of Health   Financial Resource Strain:   . Difficulty of Paying Living Expenses:   Food Insecurity:   . Worried About Charity fundraiser in the Last Year:   . Arboriculturist in the Last Year:   Transportation Needs:   . Film/video editor (Medical):   [name] Lack of Transportation (Non-Medical):   Physical Activity:   . Days of Exercise per Week:   . Minutes of Exercise per Session:   Stress:   . Feeling of  Stress :   Social Connections:   . Frequency of Communication with Friends and Family:   . Frequency of Social Gatherings with Friends and Family:   . Attends Religious Services:   . Active Member of Clubs or Organizations:   . Attends Archivist Meetings:   [name] Marital Status:   Intimate Partner Violence:   . Fear of Current or Ex-Partner:   . Emotionally Abused:   [name] Physically Abused:   . Sexually Abused:     Family History  Problem Relation Age of Onset  . Diabetes Mother   . Heart disease Mother   . Hypertension Mother   . Emphysema Father   . Lung cancer Father   . Breast cancer Sister   . Heart disease Maternal Aunt   . Stroke Neg Hx       Review of systems complete and found to be negative unless listed above      PHYSICAL EXAM  General: Thin male lying in bed upright in no acute distress, appears comfortable, pale HEENT:  Normocephalic and atramatic Neck:  +JVD.  Lungs: normal effort of breathing on room air, decreased bibasilar breath sounds Heart: tachycardic, regular rate. Bounding pulse. No murmurs Chest wall: left upper pectoral region- steri strips in place over incision; no surrounding erythema, warmth, or drainage. Abdomen: distended, soft Msk:  Gait not assessed. No obvious deformities. Normal strength and tone for age. Extremities: No clubbing, cyanosis. Trivial bilateral ankle edema.   Neuro: Alert and oriented X 3. Psych:  Good affect, responds appropriately  Labs:   Lab Results  Component Value Date   WBC 9.6 08/08/2019   HGB 13.0 08/08/2019   HCT 39.8 08/08/2019   MCV 97.8 08/08/2019   PLT 231 08/08/2019    Recent Labs  Lab 08/08/19 0757  NA 128*  K 4.9  CL 93*  CO2 25  BUN 32*  CREATININE 1.25*  CALCIUM 8.8*  PROT 6.6  BILITOT 1.1  ALKPHOS 214*  ALT 41  AST 36  GLUCOSE 157*   No results found for: CKTOTAL, CKMB, CKMBINDEX, TROPONINI  Lab Results  Component Value Date   CHOL 119 04/19/2019   CHOL 134 10/27/2018    CHOL 121 04/16/2018   Lab Results  Component Value Date   HDL 29 (L) 04/19/2019   HDL 30 (L) 10/27/2018   HDL 32 (L) 01/13/2018   Lab Results  Component Value Date   LDLCALC 64 04/19/2019   LDLCALC 53 10/27/2018   LDLCALC 69 01/13/2018   Lab Results  Component Value Date   TRIG 145 04/19/2019   TRIG 255 (H) 10/27/2018   TRIG 159 (H) 04/16/2018   Lab Results  Component Value Date   CHOLHDL 4.2 01/13/2018   No results found for: LDLDIRECT    Radiology: DG Chest Portable 1 View  Result Date: 08/08/2019 CLINICAL DATA:  Weakness, sick for 3 days, anorexia, fatigue, had  COVID-19 4 months ago, question CHF EXAM: PORTABLE CHEST 1 VIEW COMPARISON:  Portable exam 0810 hours compared to 02/09/2019 FINDINGS: LEFT subclavian ICD with leads projecting over RIGHT atrium and RIGHT ventricle. Enlargement of cardiac silhouette post CABG. Atherosclerotic calcification aorta. Scattered infiltrates in the mid to lower lungs, chronic, likely chronic interstitial lung disease with a peripheral and basilar predominance. No definite superimposed infiltrate or pleural effusion. No pneumothorax. IMPRESSION: Chronic interstitial changes in the periphery of both lungs favor chronic interstitial lung disease. Enlargement of cardiac silhouette post CABG and ICD. No acute abnormalities. Aortic Atherosclerosis (ICD10-I70.0). Electronically Signed   By: Ulyses Southward M.D.   On: 08/08/2019 08:28    EKG: sinus rhythm, 103 bpm, occasional PVCs  ASSESSMENT AND PLAN:  1. Acute on chronic HFrEF, with LVEF less than 15%, which presented as several week history of minimal bilateral ankle edema and generalized fatigue without chest pain, orthopnea, or shortness of breath; chest xray without edema or effusion. BNP >3000. Entresto and metoprolol on hold due to hypotension. 2. Ischemic cardiomyopathy, status post dual-chamber ICD on 07/25/2019.  3. Coronary artery disease, status post CABG in 1991, MI with VF arrest in 1996,  and coronary stents in 2002, without chest pain. ECG without evidence of ischemia. Troponin 37 and 35.  4. Interstitial lung disease, status post COVID, on nocturnal and PRN oxygen 5. Hyponatremia, sodium 128 6. Insomnia, uncertain etiology 7. Poor oral intake, contributing to dehydration, tachycardia, hyponatremia, and hypotension 8. Hypotension, Entresto and metoprolol on hold  Recommendations: 1. Defer diuretics at this time as patient is hypotensive, hyponatremic, and dehydrated. 2. Start gentle hydration with normal saline at 75 mL/hour for total of 1 L, closely monitoring I&O's and daily weight. 3. Defer repeat echocardiogram as patient recently had limited echo last month 4. Continue to hold Entresto at this time 5. Lasix 20 mg PRN daily for signs of fluid overload 6. Continue metoprolol succinate 12.5 mg daily to prevent reflex tachycardia 7. Further recommendations pending patient's initial course.  Signed: Leanora Ivanoff PA-C 08/08/2019, 12:36 PM  Discussed with Dr. Darrold Junker and the plan was made in collaboration with him.

## 2019-08-08 NOTE — ED Notes (Signed)
Pt given graham crackers and sprite. Dining services contacted to bring patient meal tray.

## 2019-08-08 NOTE — H&P (Addendum)
History and Physical    Matthew Wolf GDJ:[id number] DOB: 1945/11/02 DOA: 08/08/2019  PCP: [name], NP   Patient coming from: Home  I have personally briefly reviewed patient's old medical records in [place]  Chief Complaint: Fatigue  HPI: Matthew Wolf is a 74 y.o. male with medical history significant for coronary artery disease with ischemic cardiomyopathy, history of COVID-19 infection about 6 months ago, hypertension, diabetes mellitus and chronic kidney disease who presents to the emergency room for evaluation of fatigue, generalized weakness and poor oral intake.  He denies having any chest pain, no shortness of breath when compared to his baseline, no nausea, no vomiting, no diaphoresis or palpitation. Patient is status post dual-chamber AICD insertion on 07/25/19 for ischemic cardiomyopathy as well as AV nodal reentry tachycardia. Patient had a 2D echocardiogram on 07/07/19 which shows severely depressed LV dysfunction with LVEF of 20%. Chest x-ray shows chronic interstitial changes in the periphery of both lungs favor chronic interstitial lung disease. Enlargement of cardiac silhouette post CABG and ICD. Twelve-lead EKG shows sinus rhythm with PVCs. Labs reveal a serum sodium of 128, serum creatinine (baseline 0.8) and elevated BNP > 3,000  ED Course: Patient is a 74 year old Caucasian male with a past medical history significant for ischemic cardiomyopathy with last known LVEF of 20% status post recent AICD implant who presents to the emergency room for evaluation of fatigue, generalized weakness and poor oral intake.  His systolic blood pressure was in the 90s upon arrival to the emergency room and patient received 250 cc fluid bolus.  He will be admitted to the hospital for further evaluation.     Review of Systems: As per HPI otherwise 10 point review of systems negative.    Past Medical History:  Diagnosis Date  . BPH (benign prostatic hypertrophy)   .  CKD (chronic kidney disease) stage 3, GFR 30-59 ml/min   . Diabetes ([place])   . Diabetic peripheral neuropathy ([place])   . HBP (high blood pressure)   . Heart trouble   . Hyperlipidemia   . Hypertensive kidney disease   . MI (myocardial infarction) ([place]) [place]    Past Surgical History:  Procedure Laterality Date  . ANGIOPLASTY     x 2  . CARDIAC SURGERY    . CATARACT EXTRACTION, BILATERAL  06/2015  . CORONARY ARTERY BYPASS GRAFT  1991  . HERNIA REPAIR       reports that he quit smoking about 35 years ago. His smoking use included cigarettes. He has never used smokeless tobacco. He reports that he does not drink alcohol or use drugs.  No Known Allergies  Family History  Problem Relation Age of Onset  . Diabetes Mother   . Heart disease Mother   . Hypertension Mother   . Emphysema Father   . Lung cancer Father   . Breast cancer Sister   . Heart disease Maternal Aunt   . Stroke Neg Hx      Prior to Admission medications   Medication Sig Start Date End Date Taking? Authorizing Provider  albuterol (VENTOLIN HFA) 108 (90 Base) MCG/ACT inhaler TAKE 2 PUFFS BY MOUTH EVERY 6 HOURS AS NEEDED FOR WHEEZE OR SHORTNESS OF BREATH 07/01/19   Cannady, Henrine Screws T, NP  aspirin EC 81 MG tablet Take 81 mg by mouth daily.     [provider]  atorvastatin (LIPITOR) 80 MG tablet Take 80 mg by mouth at bedtime.  03/20/13   [provider]  empagliflozin (JARDIANCE) 25 MG TABS tablet Take 25 mg by mouth daily before breakfast. 04/27/19   Cannady, Henrine Screws T, NP  furosemide (LASIX) 20 MG tablet Take one tab daily by mouth for 2-3 days as needed for ankle swelling. 07/14/19 07/13/20  [provider]  insulin degludec (TRESIBA) 100 UNIT/ML FlexTouch Pen Inject 0.1 mLs (10 Units total) into the skin daily. 08/03/19   Cannady, Henrine Screws T, NP  Insulin Pen Needle 32G X 4 MM MISC 1 kit by Does not apply route daily. 08/04/19   Cannady, Henrine Screws T, NP  magnesium oxide (MAG-OX) 400 MG tablet  Take 400 mg by mouth daily.    [provider]  metFORMIN (GLUCOPHAGE) 500 MG tablet Take 500 MG by mouth (one tablet) at noon and 1000 MG by mouth (2 tablets) at 1800. 06/01/19   Cannady, Jolene T, NP  metoprolol succinate (TOPROL-XL) 50 MG 24 hr tablet Take 50 mg by mouth Nightly. Take at bedtime.    [provider]  mupirocin ointment (BACTROBAN) 2 % Place 1 application into the nose 2 (two) times daily. 06/01/19   Cannady, Henrine Screws T, NP  naproxen sodium (ALEVE) 220 MG tablet Take 220 mg by mouth 2 (two) times daily as needed (pain).    [provider]  nortriptyline (PAMELOR) 25 MG capsule TAKE 1 CAPSULE (25 MG TOTAL) BY MOUTH 2 (TWO) TIMES DAILY. 08/01/19   Cannady, Henrine Screws T, NP  Omega-3 Fatty Acids (FISH OIL PO) Take 1 capsule by mouth daily.     [provider]  sacubitril-valsartan (ENTRESTO) 24-26 MG Take 1 tablet by mouth 2 (two) times daily.    [provider]  SYMBICORT 160-4.5 MCG/ACT inhaler TAKE 2 PUFFS BY MOUTH TWICE A DAY 06/24/19   Kendell Bane, NP    Physical Exam: Vitals:   08/08/19 1100 08/08/19 1130 08/08/19 1300 08/08/19 1330  BP: 1'00/77 98/75 94/75 ' 92/70  Pulse:  (!) 101    Resp: 16 20    SpO2:  96%    Weight:      Height:         Vitals:   08/08/19 1100 08/08/19 1130 08/08/19 1300 08/08/19 1330  BP: 1'00/77 98/75 94/75 ' 92/70  Pulse:  (!) 101    Resp: 16 20    SpO2:  96%    Weight:      Height:        Constitutional: NAD, alert and oriented x 3. Chronically ill appearing Eyes: PERRL, lids and conjunctivae normal ENMT: Mucous membranes are moist.  Neck: normal, supple, no masses, no thyromegaly Respiratory: Air movement in all lung fields, no wheezing, no crackles. Normal respiratory effort. No accessory muscle use.  Cardiovascular: Regular rate and rhythm, no murmurs / rubs / gallops. No extremity edema. 2+ pedal pulses. No carotid bruits.  Abdomen: no tenderness, no masses palpated. No hepatosplenomegaly. Bowel  sounds positive.  Central adiposity Musculoskeletal: no clubbing / cyanosis. No joint deformity upper and lower extremities.  Skin: no rashes, lesions, ulcers.  Neurologic: No gross focal neurologic deficit. Psychiatric: Normal mood and affect.   Labs on Admission: I have personally reviewed following labs and imaging studies  CBC: Recent Labs  Lab 08/08/19 0757  WBC 9.6  HGB 13.0  HCT 39.8  MCV 97.8  PLT 761   Basic Metabolic Panel: Recent Labs  Lab 08/03/19 1627 08/08/19 0757  NA 132* 128*  K 5.1 4.9  CL 93* 93*  CO2 23 25  GLUCOSE 272* 157*  BUN 20 32*  CREATININE 1.11 1.25*  CALCIUM 9.2 8.8*   GFR: Estimated Creatinine Clearance: 42 mL/min (A) (by C-G formula based on SCr of 1.25 mg/dL (H)). Liver Function Tests: Recent Labs  Lab 08/08/19 0757  AST 36  ALT 41  ALKPHOS 214*  BILITOT 1.1  PROT 6.6  ALBUMIN 3.7   No results for input(s): LIPASE, AMYLASE in the last 168 hours. No results for input(s): AMMONIA in the last 168 hours. Coagulation Profile: No results for input(s): INR, PROTIME in the last 168 hours. Cardiac Enzymes: No results for input(s): CKTOTAL, CKMB, CKMBINDEX, TROPONINI in the last 168 hours. BNP (last 3 results) No results for input(s): PROBNP in the last 8760 hours. HbA1C: No results for input(s): HGBA1C in the last 72 hours. CBG: Recent Labs  Lab 08/08/19 1134  GLUCAP 116*   Lipid Profile: No results for input(s): CHOL, HDL, LDLCALC, TRIG, CHOLHDL, LDLDIRECT in the last 72 hours. Thyroid Function Tests: No results for input(s): TSH, T4TOTAL, FREET4, T3FREE, THYROIDAB in the last 72 hours. Anemia Panel: No results for input(s): VITAMINB12, FOLATE, FERRITIN, TIBC, IRON, RETICCTPCT in the last 72 hours. Urine analysis:    Component Value Date/Time   COLORURINE YELLOW 08/08/2019 1126   APPEARANCEUR CLOUDY (A) 08/08/2019 1126   LABSPEC 1.025 08/08/2019 1126   PHURINE 5.0 08/08/2019 1126   GLUCOSEU 100 (A) 08/08/2019 1126    HGBUR NEGATIVE 08/08/2019 1126   BILIRUBINUR SMALL (A) 08/08/2019 1126   KETONESUR TRACE (A) 08/08/2019 1126   PROTEINUR 100 (A) 08/08/2019 1126   NITRITE NEGATIVE 08/08/2019 1126   LEUKOCYTESUR SMALL (A) 08/08/2019 1126    Radiological Exams on Admission: DG Chest Portable 1 View  Result Date: 08/08/2019 CLINICAL DATA:  Weakness, sick for 3 days, anorexia, fatigue, had COVID-19 4 months ago, question CHF EXAM: PORTABLE CHEST 1 VIEW COMPARISON:  Portable exam 0810 hours compared to 02/09/2019 FINDINGS: LEFT subclavian ICD with leads projecting over RIGHT atrium and RIGHT ventricle. Enlargement of cardiac silhouette post CABG. Atherosclerotic calcification aorta. Scattered infiltrates in the mid to lower lungs, chronic, likely chronic interstitial lung disease with a peripheral and basilar predominance. No definite superimposed infiltrate or pleural effusion. No pneumothorax. IMPRESSION: Chronic interstitial changes in the periphery of both lungs favor chronic interstitial lung disease. Enlargement of cardiac silhouette post CABG and ICD. No acute abnormalities. Aortic Atherosclerosis (ICD10-I70.0). Electronically Signed   By: Lavonia Dana M.D.   On: 08/08/2019 08:28    EKG: Independently reviewed.  Sinus rhythm PVCs  Assessment/Plan Principal Problem:   HFrEF (heart failure with reduced ejection fraction) (HCC) Active Problems:   Coronary artery disease   Type 2 diabetes mellitus with cardiac complication (HCC)   Interstitial pulmonary disease (HCC)   ICD (implantable cardioverter-defibrillator) in place   Acute on chronic systolic (congestive) heart failure (HCC)   Hyponatremia   Dehydration    Chronic systolic heart failure Last known LVEF of 15 to 20% Patient comes in for evaluation of fatigue and weakness and his systolic blood pressure is less than 90 mmHg Patient will likely need adjustment of his medications.  He was recently started on metoprolol and Entresto Continue  torsemide We will request cardiology consult   Coronary artery disease with ischemic cardiomyopathy Continue aspirin, statins and beta-blockers. Patient is status post AICD insertion    Diabetes mellitus with stage III chronic kidney disease Continue consistent carbohydrate diet Place patient on sliding scale insulin with NovoLog   Interstitial lung disease Continue as needed bronchodilator therapy Continue inhaled steroids   Hyponatremia  Most likely from volume depletion and poor oral intake We will monitor closely   Dehydration Patient noted to have a bump in his serum creatinine from his baseline 1.11 >> 1.25 Encourage oral fluid intake  DVT prophylaxis: Lovenox Code Status: DO NOT RESUSCITATE Family Communication: Greater than 50% of time was spent discussing plan of care with patient at the bedside.  All questions and concerns were addressed.  He verbalizes understanding and agrees with the plan. Disposition Plan: Back to previous home environment Consults called: Cardiology    Katha Kuehne MD [facility]     08/08/2019, 3:16 PM

## 2019-08-08 NOTE — ED Notes (Signed)
Ready bed @ 1725, spoke with RN Katie.

## 2019-08-08 NOTE — ED Notes (Signed)
Pt given meal tray. Cardiology at bedside.

## 2019-08-08 NOTE — ED Provider Notes (Signed)
[facility] Emergency Department Provider Note    First MD Initiated Contact with Patient 08/08/19 [phone]     (approximate)  I have reviewed the triage vital signs and the nursing notes.   HISTORY  Chief Complaint Fatigue    HPI Matthew Wolf is a 74 y.o. male extensive past medical history as listed below presents to the ER for evaluation of generalized malaise fatigue and weakness over the past weekend.  Patient status post recent ICD placement for severely depressed EF.  He is on anticoagulation.  Denies any melena or hematochezia.  Does feel like his color is changed.  States that he is not able to keep any food down.  Was sick with Covid 4 months ago and states that he never got the taste back.    Past Medical History:  Diagnosis Date  . BPH (benign prostatic hypertrophy)   . CKD (chronic kidney disease) stage 3, GFR 30-59 ml/min   . Diabetes ([place])   . Diabetic peripheral neuropathy ([place])   . HBP (high blood pressure)   . Heart trouble   . Hyperlipidemia   . Hypertensive kidney disease   . MI (myocardial infarction) ([place]) 1991, 20   Family History  Problem Relation Age of Onset  . Diabetes Mother   . Heart disease Mother   . Hypertension Mother   . Emphysema Father   . Lung cancer Father   . Breast cancer Sister   . Heart disease Maternal Aunt   . Stroke Neg Hx    Past Surgical History:  Procedure Laterality Date  . ANGIOPLASTY     x 2  . CARDIAC SURGERY    . CATARACT EXTRACTION, BILATERAL  06/2015  . CORONARY ARTERY BYPASS GRAFT  1991  . HERNIA REPAIR     Patient Active Problem List   Diagnosis Date Noted  . Acute on chronic systolic (congestive) heart failure ([place]) 08/08/2019  . Hyponatremia 08/08/2019  . Dehydration 08/08/2019  . ICD (implantable cardioverter-defibrillator) in place 08/03/2019  . Callus of heel 08/03/2019  . 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 Pain of left great toe 06/01/2019  .  Interstitial pulmonary disease ([place]) 04/19/2019  . Type 2 diabetes mellitus with cardiac complication ([place]) 75/64/3329  . HFrEF (heart failure with reduced ejection fraction) ([place]) 04/12/2019  . History of 2019 novel coronavirus disease (COVID-19) 03/22/2019  . Prostate cancer screening 03/16/2015  . Coronary artery disease 11/30/2014  . Hypertension associated with diabetes ([place])   . Hyperlipidemia associated with type 2 diabetes mellitus ([place])   . Diabetic peripheral neuropathy ([place])       Prior to Admission medications   Medication Sig Start Date End Date Taking? Authorizing Provider  albuterol (VENTOLIN HFA) 108 (90 Base) MCG/ACT inhaler TAKE 2 PUFFS BY MOUTH EVERY 6 HOURS AS NEEDED FOR WHEEZE OR SHORTNESS OF BREATH Patient taking differently: Inhale 2 puffs into the lungs every 6 (six) hours as needed for wheezing or shortness of breath.  07/01/19  Yes Cannady, Jolene T, NP  aspirin EC 81 MG tablet Take 81 mg by mouth daily.    Yes [provider]  atorvastatin (LIPITOR) 80 MG tablet Take 80 mg by mouth at bedtime.  03/20/13  Yes [provider]  empagliflozin (JARDIANCE) 25 MG TABS tablet Take 25 mg by mouth daily before breakfast. 04/27/19  Yes Cannady, Jolene T, NP  furosemide (LASIX) 20 MG tablet Take 20 mg by mouth daily.    Yes [provider]  insulin degludec (TRESIBA) 100 UNIT/ML FlexTouch Pen Inject 0.1 mLs (10 Units total) into the skin daily. 08/03/19  Yes Cannady, Jolene T, NP  Insulin Pen Needle 32G X 4 MM MISC 1 kit by Does not apply route daily. 08/04/19  Yes Cannady, Jolene T, NP  magnesium oxide (MAG-OX) 400 MG tablet Take 400 mg by mouth daily.   Yes [provider]  metFORMIN (GLUCOPHAGE) 500 MG tablet Take 500 MG by mouth (one tablet) at noon and 1000 MG by mouth (2 tablets) at 1800. Patient taking differently: Take 500-1,000 mg by mouth See admin instructions. Take 1 tablet ('500mg'$ ) by mouth at breakfast and take 2 tablets ('1000mg'$ ) by  mouth every evening at supper 06/01/19  Yes Cannady, Jolene T, NP  metoprolol succinate (TOPROL-XL) 50 MG 24 hr tablet Take 50 mg by mouth at bedtime.    Yes [provider]  naproxen sodium (ALEVE) 220 MG tablet Take 220 mg by mouth 2 (two) times daily as needed (pain).   Yes [provider]  nortriptyline (PAMELOR) 25 MG capsule TAKE 1 CAPSULE (25 MG TOTAL) BY MOUTH 2 (TWO) TIMES DAILY. 08/01/19  Yes Cannady, Jolene T, NP  Omega-3 Fatty Acids (FISH OIL PO) Take 1 capsule by mouth daily.    Yes [provider]  sacubitril-valsartan (ENTRESTO) 24-26 MG Take 1 tablet by mouth 2 (two) times daily.   Yes [provider]  SYMBICORT 160-4.5 MCG/ACT inhaler TAKE 2 PUFFS BY MOUTH TWICE A DAY Patient taking differently: Inhale 2 puffs into the lungs 2 (two) times daily.  06/24/19  Yes Kendell Bane, NP    Allergies Patient has no known allergies.    Social History Social History   Tobacco Use  . Smoking status: Former Smoker    Types: Cigarettes    Quit date: 04/07/1984    Years since quitting: 35.3  . Smokeless tobacco: Never Used  Substance Use Topics  . Alcohol use: No  . Drug use: No    Review of Systems Patient denies headaches, rhinorrhea, blurry vision, numbness, shortness of breath, chest pain, edema, cough, abdominal pain, nausea, vomiting, diarrhea, dysuria, fevers, rashes or hallucinations unless otherwise stated above in HPI. ____________________________________________   PHYSICAL EXAM:  VITAL SIGNS: Vitals:   08/08/19 1130 08/08/19 1300  BP: 98/75 94/75  Pulse: (!) 101   Resp: 20   SpO2: 96%     Constitutional: Alert and oriented.  Eyes: Conjunctivae are normal.  Head: Atraumatic. Nose: No congestion/rhinnorhea. Mouth/Throat: Mucous membranes are moist.   Neck: No stridor. Painless ROM.  Cardiovascular: Normal rate, regular rhythm. Grossly normal heart sounds.  Good peripheral circulation. Respiratory: Normal respiratory  effort.  No retractions. Lungs CTAB. Gastrointestinal: Soft and nontender. No distention. No abdominal bruits. No CVA tenderness. Genitourinary:  Musculoskeletal: No lower extremity tenderness nor edema.  No joint effusions. Neurologic:  Normal speech and language. No gross focal neurologic deficits are appreciated. No facial droop Skin:  Skin is warm, dry and intact. No rash noted. Psychiatric: Mood and affect are normal. Speech and behavior are normal.  ____________________________________________   LABS (all labs ordered are listed, but only abnormal results are displayed)  Results for orders placed or performed during the hospital encounter of 08/08/19 (from the past 24 hour(s))  CBC     Status: Abnormal   Collection Time: 08/08/19  7:57 AM  Result Value Ref Range   WBC 9.6 4.0 - 10.5 K/uL   RBC 4.07 (L) 4.22 - 5.81 MIL/uL  Hemoglobin 13.0 13.0 - 17.0 g/dL   HCT 39.8 39.0 - 52.0 %   MCV 97.8 80.0 - 100.0 fL   MCH 31.9 26.0 - 34.0 pg   MCHC 32.7 30.0 - 36.0 g/dL   RDW 14.4 11.5 - 15.5 %   Platelets 231 150 - 400 K/uL   nRBC 0.0 0.0 - 0.2 %  Troponin I (High Sensitivity)     Status: Abnormal   Collection Time: 08/08/19  7:57 AM  Result Value Ref Range   Troponin I (High Sensitivity) 37 (H) <18 ng/L  Brain natriuretic peptide     Status: Abnormal   Collection Time: 08/08/19  7:57 AM  Result Value Ref Range   B Natriuretic Peptide 3,198.0 (H) 0.0 - 100.0 pg/mL  Comprehensive metabolic panel     Status: Abnormal   Collection Time: 08/08/19  7:57 AM  Result Value Ref Range   Sodium 128 (L) 135 - 145 mmol/L   Potassium 4.9 3.5 - 5.1 mmol/L   Chloride 93 (L) 98 - 111 mmol/L   CO2 25 22 - 32 mmol/L   Glucose, Bld 157 (H) 70 - 99 mg/dL   BUN 32 (H) 8 - 23 mg/dL   Creatinine, Ser 1.25 (H) 0.61 - 1.24 mg/dL   Calcium 8.8 (L) 8.9 - 10.3 mg/dL   Total Protein 6.6 6.5 - 8.1 g/dL   Albumin 3.7 3.5 - 5.0 g/dL   AST 36 15 - 41 U/L   ALT 41 0 - 44 U/L   Alkaline Phosphatase 214  (H) 38 - 126 U/L   Total Bilirubin 1.1 0.3 - 1.2 mg/dL   GFR calc non Af Amer 56 (L) >60 mL/min   GFR calc Af Amer >60 >60 mL/min   Anion gap 10 5 - 15  Type and screen [facility]     Status: None   Collection Time: 08/08/19  8:02 AM  Result Value Ref Range   ABO/RH(D) A POS    Antibody Screen NEG    Sample Expiration      08/11/2019,2359 Performed at [facility], [address] [place], [place] [address]   Troponin I (High Sensitivity)     Status: Abnormal   Collection Time: 08/08/19  9:43 AM  Result Value Ref Range   Troponin I (High Sensitivity) 35 (H) <18 ng/L  Respiratory Panel by RT PCR (Flu A&B, Covid) - Nasopharyngeal Swab     Status: None   Collection Time: 08/08/19  9:43 AM   Specimen: Nasopharyngeal Swab  Result Value Ref Range   SARS Coronavirus 2 by RT PCR NEGATIVE NEGATIVE   Influenza A by PCR NEGATIVE NEGATIVE   Influenza B by PCR NEGATIVE NEGATIVE  Urinalysis, Complete w Microscopic     Status: Abnormal   Collection Time: 08/08/19 11:26 AM  Result Value Ref Range   Color, Urine YELLOW YELLOW   APPearance CLOUDY (A) CLEAR   Specific Gravity, Urine 1.025 1.005 - 1.030   pH 5.0 5.0 - 8.0   Glucose, UA 100 (A) NEGATIVE mg/dL   Hgb urine dipstick NEGATIVE NEGATIVE   Bilirubin Urine SMALL (A) NEGATIVE   Ketones, ur TRACE (A) NEGATIVE mg/dL   Protein, ur 100 (A) NEGATIVE mg/dL   Nitrite NEGATIVE NEGATIVE   Leukocytes,Ua SMALL (A) NEGATIVE   Squamous Epithelial / LPF NONE SEEN 0 - 5   WBC, UA 11-20 0 - 5 WBC/hpf   RBC / HPF NONE SEEN 0 - 5 RBC/hpf   Bacteria, UA MANY (  A) NONE SEEN   WBC Clumps PRESENT   Glucose, capillary     Status: Abnormal   Collection Time: 08/08/19 11:34 AM  Result Value Ref Range   Glucose-Capillary 116 (H) 70 - 99 mg/dL   ____________________________________________  EKG My review and personal interpretation at Time: 7:40   Indication: weakness  Rate: 100  Rhythm: sinus with first degree block Axis:  left Other: nonspecific st and t wave abn,  Occasional PVC ____________________________________________  RADIOLOGY  I personally reviewed all radiographic images ordered to evaluate for the above acute complaints and reviewed radiology reports and findings.  These findings were personally discussed with the patient.  Please see medical record for radiology report.  ____________________________________________   PROCEDURES  Procedure(s) performed:  Procedures    Critical Care performed: no ____________________________________________   INITIAL IMPRESSION / ASSESSMENT AND PLAN / ED COURSE  Pertinent labs & imaging results that were available during my care of the patient were reviewed by me and considered in my medical decision making (see chart for details).   DDX: chf, electrolyte abn, anemia, medication effect, post covid syndrome   Matthew Wolf is a 74 y.o. who presents to the ED with symptoms as described above.  Patient very complex past medical history and presentation today.  Appears to be having worsening heart failure with soft blood pressure.  Does appear mildly dehydration with evidence of mild acute hyponatremia.  Will give gentle bolus of IV fluid to see if that helps his blood pressure.  The patient will be placed on continuous pulse oximetry and telemetry for monitoring.  Laboratory evaluation will be sent to evaluate for the above complaints.     Clinical Course as of Aug 08 1350  Mon Aug 08, 2019  0934 Patient does have mildly reduced sodium that appears acute.  Is given gentle IV hydration.  BNP is elevated.  Troponin is elevated.  I suspect this is secondary to worsening severe we reduced EF and heart failure.  He does not have any anemia.  Given his frailty factors significant comorbidities I will discuss with hospitalist I do think you require additional observation for medical management.   [PR]    Clinical Course User Index [PR] Merlyn Lot, MD     The patient was evaluated in Emergency Department today for the symptoms described in the history of present illness. He/she was evaluated in the context of the global COVID-19 pandemic, which necessitated consideration that the patient might be at risk for infection with the SARS-CoV-2 virus that causes COVID-19. Institutional protocols and algorithms that pertain to the evaluation of patients at risk for COVID-19 are in a state of rapid change based on information released by regulatory bodies including the CDC and federal and state organizations. These policies and algorithms were followed during the patient's care in the ED.  As part of my medical decision making, I reviewed the following data within the [place] notes reviewed and incorporated, Labs reviewed, notes from prior ED visits and [place] Controlled Substance Database   ____________________________________________   FINAL CLINICAL IMPRESSION(S) / ED DIAGNOSES  Final diagnoses:  Other fatigue  Acute on chronic heart failure, unspecified heart failure type ([place])  Hyponatremia      NEW MEDICATIONS STARTED DURING THIS VISIT:  New Prescriptions   No medications on file     Note:  This document was prepared using Dragon voice recognition software and may include unintentional dictation errors.    Merlyn Lot, MD 08/08/19  1352  

## 2019-08-08 NOTE — ED Triage Notes (Signed)
Pt arrives via ems from home with reports of "feeling crummy". Ems report covid + 4 months ago, pt reported sick x 3 days. Denies any n/v/d, chest pain, SOB. Cardiac hx, ICD done on 4/19, follow up 5/6. pt currently reports not having appetite and feeling fatigued over last several days, non productive cough. Pt a&o x 4 on arrival, NAD noted at this time.

## 2019-08-09 ENCOUNTER — Other Ambulatory Visit: Payer: Self-pay | Admitting: Pharmacy Technician

## 2019-08-09 ENCOUNTER — Inpatient Hospital Stay: Payer: Medicare HMO

## 2019-08-09 ENCOUNTER — Telehealth: Payer: Self-pay

## 2019-08-09 DIAGNOSIS — E43 Unspecified severe protein-calorie malnutrition: Secondary | ICD-10-CM | POA: Insufficient documentation

## 2019-08-09 LAB — BRAIN NATRIURETIC PEPTIDE: B Natriuretic Peptide: 3247 pg/mL — ABNORMAL HIGH (ref 0.0–100.0)

## 2019-08-09 LAB — BASIC METABOLIC PANEL
Anion gap: 9 (ref 5–15)
Anion gap: 12 (ref 5–15)
BUN: 35 mg/dL — ABNORMAL HIGH (ref 8–23)
BUN: 36 mg/dL — ABNORMAL HIGH (ref 8–23)
CO2: 24 mmol/L (ref 22–32)
CO2: 19 mmol/L — ABNORMAL LOW (ref 22–32)
Calcium: 8.5 mg/dL — ABNORMAL LOW (ref 8.9–10.3)
Calcium: 8.5 mg/dL — ABNORMAL LOW (ref 8.9–10.3)
Chloride: 95 mmol/L — ABNORMAL LOW (ref 98–111)
Chloride: 93 mmol/L — ABNORMAL LOW (ref 98–111)
Creatinine, Ser: 1.04 mg/dL (ref 0.61–1.24)
Creatinine, Ser: 1.12 mg/dL (ref 0.61–1.24)
GFR calc Af Amer: >60 mL/min (ref >60)
GFR calc Af Amer: >60 mL/min (ref >60)
GFR calc non Af Amer: >60 mL/min (ref >60)
GFR calc non Af Amer: >60 mL/min (ref >60)
Glucose, Bld: 203 mg/dL — ABNORMAL HIGH (ref 70–99)
Glucose, Bld: 235 mg/dL — ABNORMAL HIGH (ref 70–99)
Potassium: 5.1 mmol/L (ref 3.5–5.1)
Potassium: 5.3 mmol/L — ABNORMAL HIGH (ref 3.5–5.1)
Sodium: 128 mmol/L — ABNORMAL LOW (ref 135–145)
Sodium: 124 mmol/L — ABNORMAL LOW (ref 135–145)

## 2019-08-09 LAB — BLOOD GAS, ARTERIAL
Acid-base deficit: 6 mmol/L — ABNORMAL HIGH (ref 0.0–2.0)
Bicarbonate: 17.1 mmol/L — ABNORMAL LOW (ref 20.0–28.0)
FIO2: 1
O2 Saturation: 99.9 %
Patient temperature: 37
pCO2 arterial: 27 mmHg — ABNORMAL LOW (ref 32.0–48.0)
pH, Arterial: 7.41 (ref 7.350–7.450)
pO2, Arterial: 288 mmHg — ABNORMAL HIGH (ref 83.0–108.0)

## 2019-08-09 LAB — GLUCOSE, CAPILLARY
Glucose-Capillary: 194 mg/dL — ABNORMAL HIGH (ref 70–99)
Glucose-Capillary: 236 mg/dL — ABNORMAL HIGH (ref 70–99)
Glucose-Capillary: 197 mg/dL — ABNORMAL HIGH (ref 70–99)
Glucose-Capillary: 189 mg/dL — ABNORMAL HIGH (ref 70–99)
Glucose-Capillary: 167 mg/dL — ABNORMAL HIGH (ref 70–99)

## 2019-08-09 LAB — CBC
HCT: 38.6 % — ABNORMAL LOW (ref 39.0–52.0)
Hemoglobin: 13 g/dL (ref 13.0–17.0)
MCH: 32.3 pg (ref 26.0–34.0)
MCHC: 33.7 g/dL (ref 30.0–36.0)
MCV: 95.8 fL (ref 80.0–100.0)
Platelets: 219 10*3/uL (ref 150–400)
RBC: 4.03 MIL/uL — ABNORMAL LOW (ref 4.22–5.81)
RDW: 14.4 % (ref 11.5–15.5)
WBC: 9.9 10*3/uL (ref 4.0–10.5)
nRBC: 0 % (ref 0.0–0.2)

## 2019-08-09 LAB — MAGNESIUM: Magnesium: 2.1 mg/dL (ref 1.7–2.4)

## 2019-08-09 LAB — MRSA PCR SCREENING: MRSA by PCR: NEGATIVE

## 2019-08-09 MED ORDER — ENSURE MAX PROTEIN PO LIQD
11.0000 [oz_av] | Freq: Every day | ORAL | Status: DC
  Administered 2019-08-11 – 2019-08-12 (×2): 11 [oz_av] via ORAL
  Filled 2019-08-09: qty 330

## 2019-08-09 MED ORDER — SODIUM CHLORIDE 0.9 % IV SOLN: INTRAVENOUS | Status: DC

## 2019-08-09 MED ORDER — LEVALBUTEROL HCL 0.63 MG/3ML IN NEBU: 0.6300 mg | INHALATION_SOLUTION | Freq: Three times a day (TID) | RESPIRATORY_TRACT | Status: DC | PRN

## 2019-08-09 MED ORDER — ADULT MULTIVITAMIN W/MINERALS CH
1.0000 | ORAL_TABLET | Freq: Every day | ORAL | Status: DC
  Administered 2019-08-10 – 2019-08-13 (×4): 1 via ORAL
  Filled 2019-08-09 (×4): qty 1

## 2019-08-09 MED ORDER — CHLORHEXIDINE GLUCONATE CLOTH 2 % EX PADS
6.0000 | MEDICATED_PAD | Freq: Every day | CUTANEOUS | Status: DC
  Administered 2019-08-09 – 2019-08-13 (×5): 6 via TOPICAL

## 2019-08-09 MED ORDER — GLUCERNA SHAKE PO LIQD
237.0000 mL | Freq: Two times a day (BID) | ORAL | Status: DC
  Administered 2019-08-09 – 2019-08-13 (×6): 237 mL via ORAL

## 2019-08-09 MED ORDER — METOPROLOL TARTRATE 5 MG/5ML IV SOLN
INTRAVENOUS | Status: AC
  Filled 2019-08-09: qty 5

## 2019-08-09 MED ORDER — MORPHINE SULFATE (PF) 2 MG/ML IV SOLN
2.0000 mg | Freq: Once | INTRAVENOUS | Status: AC
  Administered 2019-08-09: 2 mg via INTRAVENOUS

## 2019-08-09 MED ORDER — SODIUM BICARBONATE 8.4 % IV SOLN
50.0000 meq | Freq: Once | INTRAVENOUS | Status: AC
  Administered 2019-08-09: 22:00:00 50 meq via INTRAVENOUS
  Filled 2019-08-09: qty 50

## 2019-08-09 MED ORDER — MORPHINE SULFATE (PF) 2 MG/ML IV SOLN
INTRAVENOUS | Status: AC
  Filled 2019-08-09: qty 1

## 2019-08-09 MED ORDER — METOPROLOL TARTRATE 5 MG/5ML IV SOLN
5.0000 mg | Freq: Once | INTRAVENOUS | Status: AC
  Administered 2019-08-09: 5 mg via INTRAVENOUS

## 2019-08-09 MED ORDER — ALPRAZOLAM 0.25 MG PO TABS
0.2500 mg | ORAL_TABLET | Freq: Once | ORAL | Status: AC
  Administered 2019-08-09: 19:00:00 0.25 mg via ORAL
  Filled 2019-08-09: qty 1

## 2019-08-09 MED ORDER — SODIUM CHLORIDE 0.9 % IV SOLN: Freq: Once | INTRAVENOUS | Status: AC

## 2019-08-09 NOTE — Progress Notes (Addendum)
    BRIEF OVERNIGHT PROGRESS REPORT   SUBJECTIVE: Rapid response initiated for increased work of breathing, tachypnea, tachycardia and hypoxia.  OBJECTIVE: On arrival to the ED, he was afebrile with blood pressure 82/65 mm Hg and pulse rate 125 beats/min. There were no focal neurological deficits; he was alert and oriented x4, and demonstrated increased work of breathing with the use of accessory muscle for breathing.  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  ASSESSMENT/PLAN: Acute hypoxic respiratory failure in the setting of CHF exacerbation Hx. Recent COVID 19 illness Acute presentation likely due to volume overload with associated symptoms of SOB. BNP>3000 - Transfer to stepdown - Repeat Chest x-ray shows worsening pulmonary vascular congestion  - Supplemental O2 as needed to maintain O2 saturations 88 to 94% - Follow intermittent CXR & ABG as needed , will give 1 amp of bicarb - Last Echo LV EF 25-20% - Afterload, Goal MAP <70: Hold Entresto per Cardiology in the setting of hypotension - Continue Metoprolol - Furosemide Goal >1L negative per day until approach euvolemia / worsening renal function. - Low salt diet  - Check daily weight - Strict I&Os - Cardiology following     Webb Silversmith, DNP, CCRN, FNP-C [facility] Nurse Practitioner

## 2019-08-09 NOTE — Progress Notes (Signed)
[facility] Cardiology    SUBJECTIVE: The patient has no complaints this morning. He reports that his initial symptoms of sluggishness and generally feeling unwell have much improved, though he does not feel back to baseline. He denies chest pain, shortness of breath, or orthopnea.    Vitals:   08/08/19 2206 08/09/19 0012 08/09/19 0148 08/09/19 0401  BP: 93/70 99/73 104/75 102/74  Pulse: (!) 107 (!) 108 (!) 109 (!) 109  Resp:      Temp:      TempSrc:      SpO2:  99% 95% 100%  Weight:    63.5 kg  Height:         Intake/Output Summary (Last 24 hours) at 08/09/2019 0755 Last data filed at 08/09/2019 3086 Gross per 24 hour  Intake 250 ml  Output 400 ml  Net -150 ml      PHYSICAL EXAM  General: Thin male sitting up in bed upright in no acute distress, appears comfortable, pallor HEENT:  Normocephalic and atramatic Neck:   no JVD.  Lungs: normal effort of breathing on supplemental oxygen via nasal cannula; decreased bibasilar breath sounds Heart: tachycardic, regular rate.  No murmurs Chest wall: left upper pectoral region- steri strips in place over incision; no surrounding erythema, warmth, or drainage. Abdomen: distended, soft Msk:  Gait not assessed. No obvious deformities. Able to sit upright upright in bed without difficulty. Extremities: No clubbing, cyanosis, or edema.   Neuro: Alert and oriented X 3. Psych:  Good affect, responds appropriately    LABS: Basic Metabolic Panel: Recent Labs    08/08/19 0757 08/09/19 0417  NA 128* 128*  K 4.9 5.1  CL 93* 95*  CO2 25 24  GLUCOSE 157* 203*  BUN 32* 35*  CREATININE 1.25* 1.04  CALCIUM 8.8* 8.5*   Liver Function Tests: Recent Labs    08/08/19 0757  AST 36  ALT 41  ALKPHOS 214*  BILITOT 1.1  PROT 6.6  ALBUMIN 3.7   No results for input(s): LIPASE, AMYLASE in the last 72 hours. CBC: Recent Labs    08/08/19 0757 08/09/19 0417  WBC 9.6 9.9  HGB 13.0 13.0  HCT 39.8 38.6*  MCV 97.8 95.8  PLT 231 219    Cardiac Enzymes: No results for input(s): CKTOTAL, CKMB, CKMBINDEX, TROPONINI in the last 72 hours. BNP: Invalid input(s): POCBNP D-Dimer: No results for input(s): DDIMER in the last 72 hours. Hemoglobin A1C: Recent Labs    08/08/19 1139  HGBA1C 8.8*   Fasting Lipid Panel: No results for input(s): CHOL, HDL, LDLCALC, TRIG, CHOLHDL, LDLDIRECT in the last 72 hours. Thyroid Function Tests: No results for input(s): TSH, T4TOTAL, T3FREE, THYROIDAB in the last 72 hours.  Invalid input(s): FREET3 Anemia Panel: No results for input(s): VITAMINB12, FOLATE, FERRITIN, TIBC, IRON, RETICCTPCT in the last 72 hours.  DG Chest Portable 1 View  Result Date: 08/08/2019 CLINICAL DATA:  Weakness, sick for 3 days, anorexia, fatigue, had COVID-19 4 months ago, question CHF EXAM: PORTABLE CHEST 1 VIEW COMPARISON:  Portable exam 0810 hours compared to 02/09/2019 FINDINGS: LEFT subclavian ICD with leads projecting over RIGHT atrium and RIGHT ventricle. Enlargement of cardiac silhouette post CABG. Atherosclerotic calcification aorta. Scattered infiltrates in the mid to lower lungs, chronic, likely chronic interstitial lung disease with a peripheral and basilar predominance. No definite superimposed infiltrate or pleural effusion. No pneumothorax. IMPRESSION: Chronic interstitial changes in the periphery of both lungs favor chronic interstitial lung disease. Enlargement of cardiac silhouette post CABG and ICD. No acute  abnormalities. Aortic Atherosclerosis (ICD10-I70.0). Electronically Signed   By: Ulyses Southward M.D.   On: 08/08/2019 08:28     TELEMETRY: sinus tachycardia, 111 bpm  ASSESSMENT AND PLAN:  Principal Problem:   HFrEF (heart failure with reduced ejection fraction) (HCC) Active Problems:   Coronary artery disease   Type 2 diabetes mellitus with cardiac complication (HCC)   Interstitial pulmonary disease (HCC)   ICD (implantable cardioverter-defibrillator) in place   Acute on chronic systolic  (congestive) heart failure (HCC)   Hyponatremia   Dehydration    1. Acute on chronic HFrEF, with LVEF less than 15%, which presented as several week history of minimal bilateral ankle edema and generalized fatigue without chest pain, orthopnea, or shortness of breath; chest xray without edema or effusion. BNP >3000. Patient does not appear fluid overloaded. Entresto on hold due to hypotension. Metoprolol resumed. 2. Ischemic cardiomyopathy, status post dual-chamber ICD on 07/25/2019.  3. Coronary artery disease, status post CABG in 1991, MI with VF arrest in 1996, and coronary stents in 2002, without chest pain. ECG without evidence of ischemia. Troponin 37 and 35.  4. Interstitial lung disease, status post COVID, on nocturnal and PRN oxygen 5. Hyponatremia, sodium 128 again this morning  6. Insomnia, uncertain etiology 7. Poor oral intake, contributing to dehydration, tachycardia, hyponatremia, and hypotension. Feels better this morning after 1 L normal saline, creatinine improved. 8. Hypotension, Entresto on hold, low dose metoprolol succinate resumed  Recommendations: 1. Recommend giving 1 additional liter of normal saline at 50 mL/hour  2. BMP in morning. 3. Continue to monitor I&Os and daily weights. 4. Continue metoprolol succinate 12.5 mg daily to prevent reflex tachycardia. 5. Continue to hold Entresto 6. Heart healthy diet. 7. Lasix 20 mg PRN daily for signs of fluid overload 8. Further recommendations pending patient's course today.  Leanora Ivanoff, PA-C 08/09/2019 7:55 AM Discussed with Dr. Darrold Junker; plan made in collaboration with him.

## 2019-08-09 NOTE — Progress Notes (Signed)
Late entry: Patient received orders to transfer to ICU. Report called to [place], Charity fundraiser. Family bedside and updated by NP.

## 2019-08-09 NOTE — Evaluation (Signed)
Physical Therapy Evaluation Patient Details Name: Matthew Wolf MRN: [id number] DOB: 1945/10/22 Today's Date: 08/09/2019   History of Present Illness  Matthew Wolf is a 46yoM who comes to [facility] 5/3, pt admitted with A/C CHF, noted EF: 15%.  Pt having hypotension and hyponeatremia while here as well. BPH, CKD III, DM, PND, HTN, HLD, MI, ICM, CABG, COVID PNA (Oct '20), ICD done on 4/19. Na+: 128.  Clinical Impression  Pt admitted with above diagnosis. Pt currently with functional limitations due to the deficits listed below (see "PT Problem List"). Upon entry, pt in bed, awake and agreeable to participate, wife at bedside. The pt is alert and oriented x4, pleasant, conversational, and generally a good historian. ModI bed mobility, modI transfers, supervision level AMB due to chronic unsteadiness of gait, but AMB is acutely impaired in that he is unable to AMB beyond ~71ft (limited household distances). Pt is a chronically difficult read for saturations, but the best signal obtained shows SpO2 at 76% on 2L, however this does not correlate well to presentation, hence unclear the value of this. Functional mobility assessment demonstrates increased effort/time requirements, poor tolerance, but need for physical assistance, whereas the patient performed these at a higher level of independence PTA. Pt endorses full independence PTA, but this date wife must assist with shoe donning. Visualized chronic heel wounds at request of wife, noted to have pedal callus formation in the central calcaneal area bilat, about nickel sized on Left and quarter sized on right. Pt encouraged to consider recommendations for orthopedic footwear as previously recommended by podiatry PTA. Pt will benefit from skilled PT intervention to increase independence and safety with basic mobility in preparation for discharge to the venue listed below.       Follow Up Recommendations Outpatient PT    Equipment Recommendations  None recommended  by PT    Recommendations for Other Services       Precautions / Restrictions Precautions Precautions: Fall Restrictions Weight Bearing Restrictions: No      Mobility  Bed Mobility Overal bed mobility: Needs Assistance Bed Mobility: Supine to Sit;Sit to Supine     Supine to sit: Supervision Sit to supine: Supervision      Transfers Overall transfer level: Needs assistance Equipment used: None Transfers: Sit to/from Stand Sit to Stand: Supervision            Ambulation/Gait Ambulation/Gait assistance: Min guard Gait Distance (Feet): 70 Feet Assistive device: None Gait Pattern/deviations: Ataxic     General Gait Details: on O2, author assists with line management, Pt has recurren tLOB with self correction, intermittent use of wall for support  Stairs            Wheelchair Mobility    Modified Rankin (Stroke Patients Only)       Balance Overall balance assessment: Modified Independent;Mild deficits observed, not formally tested                                           Pertinent Vitals/Pain Pain Assessment: No/denies pain    Home Living Family/patient expects to be discharged to:: Private residence Living Arrangements: Spouse/significant other Available Help at Discharge: Family;Available 24 hours/day Type of Home: House Home Access: Stairs to enter Entrance Stairs-Rails: None Entrance Stairs-Number of Steps: 1 Home Layout: One level Home Equipment: Walker - 2 wheels;Crutches Additional Comments: has 2 O2 concentrators, 1 at work, 1 at home,  2L as needed, but wears at home all time, uses an oximeter. Son is also on O2.    Prior Function Level of Independence: Independent         Comments: still working, no falls/close calls     Hand Dominance   Dominant Hand: Right    Extremity/Trunk Assessment   Upper Extremity Assessment Upper Extremity Assessment: Generalized weakness;Overall WFL for tasks assessed     Lower Extremity Assessment Lower Extremity Assessment: Overall WFL for tasks assessed;Generalized weakness       Communication   Communication: No difficulties  Cognition Arousal/Alertness: Awake/alert Behavior During Therapy: WFL for tasks assessed/performed Overall Cognitive Status: Within Functional Limits for tasks assessed                                        General Comments General comments (skin integrity, edema, etc.): chronic PND with heel callus centrally, pedally. no falls history.    Exercises     Assessment/Plan    PT Assessment Patient needs continued PT services  PT Problem List Decreased strength;Decreased range of motion;Decreased activity tolerance;Decreased balance;Decreased mobility;Cardiopulmonary status limiting activity       PT Treatment Interventions DME instruction;Balance training;Gait training;Functional mobility training;Therapeutic activities;Therapeutic exercise;Patient/family education    PT Goals (Current goals can be found in the Care Plan section)  Acute Rehab PT Goals Patient Stated Goal: regain baseline level of independence and ability to return to work PT Goal Formulation: With patient Time For Goal Achievement: 08/23/19 Potential to Achieve Goals: Good    Frequency Min 2X/week   Barriers to discharge        Co-evaluation               AM-PAC PT "6 Clicks" Mobility  Outcome Measure Help needed turning from your back to your side while in a flat bed without using bedrails?: None Help needed moving from lying on your back to sitting on the side of a flat bed without using bedrails?: None Help needed moving to and from a bed to a chair (including a wheelchair)?: A Little Help needed standing up from a chair using your arms (e.g., wheelchair or bedside chair)?: A Little Help needed to walk in hospital room?: A Little Help needed climbing 3-5 steps with a railing? : A Lot 6 Click Score: 19    End of  Session Equipment Utilized During Treatment: Gait belt;Oxygen Activity Tolerance: Patient tolerated treatment well;No increased pain;Patient limited by fatigue Patient left: in bed;with family/visitor present;with call bell/phone within reach Nurse Communication: Mobility status PT Visit Diagnosis: Unsteadiness on feet (R26.81);Other abnormalities of gait and mobility (R26.89);Difficulty in walking, not elsewhere classified (R26.2)    Time: 5053-9767 PT Time Calculation (min) (ACUTE ONLY): 34 min   Charges:   PT Evaluation $PT Eval High Complexity: 1 High PT Treatments $Therapeutic Exercise: 8-22 mins        3:33 PM, 08/09/19 Rosamaria Lints, PT, DPT Physical Therapist - [facility]  [phone] (ASCOM)    Athel Merriweather C 08/09/2019, 3:29 PM

## 2019-08-09 NOTE — Progress Notes (Signed)
Family member rang out to desk to request nurse to come check patient.  Patient lying in bed alert but tachypneic and tachycardic.  Wife reports pill given earlier for anxiety. VS taken.  Oxygen saturations decreased, HR increased.  Placed on non rebreather.  Rapid response called.

## 2019-08-09 NOTE — Progress Notes (Signed)
Initial Nutrition Assessment  DOCUMENTATION CODES:   Severe malnutrition in context of chronic illness  INTERVENTION:   Ensure Max protein supplement daily, each supplement provides 150kcal and 30g of protein.  Glucerna Shake po BID, each supplement provides 220 kcal and 10 grams of protein  MVI daily   NUTRITION DIAGNOSIS:   Severe Malnutrition related to chronic illness(CHF, DM) as evidenced by severe fat depletion, moderate fat depletion, moderate muscle depletion, severe muscle depletion.  GOAL:   Patient will meet greater than or equal to 90% of their needs  MONITOR:   PO intake, Skin, Supplement acceptance, Labs, Weight trends, I & O's  REASON FOR ASSESSMENT:   Malnutrition Screening Tool    ASSESSMENT:   74 y.o. male with medical history significant for coronary artery disease with ischemic cardiomyopathy, history of COVID-19 infection about 6 months ago, hypertension, diabetes mellitus and chronic kidney disease who presents to the emergency room for evaluation of fatigue, generalized weakness and poor oral intake.   Met with pt in room today. Pt reports decreased appetite and oral intake ever since his COVID 19 infection in 01/2019. Pt reports that his appetite has been poor for the past week; pt reports that he has no desire to eat. Pt reports that the last good meal he had was last Thursday, 5/29 when he ate beef tips/rice and green beans. Pt has been drinking chocolate Ensure Max protein at home. RD discussed with patient the importance of adequate nutrition needed to preserve lean muscle. Pt would like to have Ensure Max and try Glucerna as this has more calories. Pt reports eating some grits, eggs and one piece of bacon for breakfast today. Pt reports eating only bites of his lunch. Pt reports significant weight loss after his COVID 19 infection but reports that he has gained some of his weight back. Per chart, pt appears weight stable since November.    Medications  reviewed and include: aspirin, lovenox, insulin, Mg oxide, lovaza  Labs reviewed: Na 128(L), BUN 35(H) BNP- 3198(H)- 5/3 cbgs- 194, 236 x 24 hrs AIC 8.8(H)- 5/3  NUTRITION - FOCUSED PHYSICAL EXAM:    Most Recent Value  Orbital Region  Mild depletion  Upper Arm Region  Severe depletion  Thoracic and Lumbar Region  Moderate depletion  Buccal Region  Moderate depletion  Temple Region  Moderate depletion  Clavicle Bone Region  Severe depletion  Clavicle and Acromion Bone Region  Severe depletion  Scapular Bone Region  Moderate depletion  Dorsal Hand  Moderate depletion  Patellar Region  Moderate depletion  Anterior Thigh Region  Moderate depletion  Posterior Calf Region  Moderate depletion  Edema (RD Assessment)  None  Hair  Reviewed  Eyes  Reviewed  Mouth  Reviewed  Skin  Reviewed  Nails  Reviewed     Diet Order:   Diet Order            Diet Carb Modified Fluid consistency: Thin; Room service appropriate? Yes  Diet effective now             EDUCATION NEEDS:   Education needs have been addressed  Skin:  Skin Assessment: Reviewed RN Assessment  Last BM:  PTA  Height:   Ht Readings from Last 1 Encounters:  08/08/19 '5\' 7"'  (1.702 m)    Weight:   Wt Readings from Last 1 Encounters:  08/09/19 63.5 kg    Ideal Body Weight:  67.3 kg  BMI:  Body mass index is 21.94 kg/m.  Estimated Nutritional Needs:  Kcal:  1700-2000kcal/day  Protein:  85-95g/day  Fluid:  >1.6L/day  Koleen Distance MS, RD, LDN Please refer to [facility] for RD and/or RD on-call/weekend/after hours pager

## 2019-08-09 NOTE — Progress Notes (Signed)
ReDS Pro reading not appropriate due to BMI < 22.

## 2019-08-09 NOTE — Patient Outreach (Signed)
[facility] Care Management  08/09/2019  Matthew Wolf November 18, 1945 [id number]   Received both patient and provider portion(s) of patient assistance application(s) for [place]. Faxed completed application and required documents into [employer] and [employer].  Will follow up with company(ies) in 5-10 business days to check status of application(s).  Mariama Saintvil P. Stirling Orton, CPhT [facility] [employer]  [phone]

## 2019-08-09 NOTE — Progress Notes (Signed)
Pt tx from rm 258 to CCU # 5; Alert, Lethargic , follows commands. HR @140 's, O2 sat @ 82% on Non-rebreather mask.   Morphine sulfate 2 mg I.v., lopressor given ' Sodium bicar 1amp ordered and given. Family @ bedside. Will continue to monitor pt closely.

## 2019-08-09 NOTE — Progress Notes (Addendum)
PROGRESS NOTE    Matthew Wolf  OMB:[id number] DOB: 03-Jan-1946 DOA: 08/08/2019 PCP: Marjie Skiff, NP    Brief Narrative:  Matthew Wolf is a 74 y.o. male with medical history significant for coronary artery disease with ischemic cardiomyopathy, history of COVID-19 infection about 6 months ago, hypertension, diabetes mellitus and chronic kidney disease who presents to the emergency room for evaluation of fatigue, generalized weakness and poor oral intake.  He denies having any chest pain, no shortness of breath when compared to his baseline, no nausea, no vomiting, no diaphoresis or palpitation. Patient is status post dual-chamber AICD insertion on 07/25/19 for ischemic cardiomyopathy as well as AV nodal reentry tachycardia. Patient had a 2D echocardiogram on 07/07/19 which shows severely depressed LV dysfunction with LVEF of 20%. Chest x-ray shows chronic interstitial changes in the periphery of both lungs favor chronic interstitial lung disease. Enlargement of cardiac silhouette post CABG and ICD. Twelve-lead EKG shows sinus rhythm with PVCs. Labs reveal a serum sodium of 128, serum creatinine (baseline 0.8) and elevated BNP > 3,000    Consultants:   cards  Procedures:   Antimicrobials:      Subjective: Pt has no complaints except feeling weak.   Objective: Vitals:   08/09/19 1126 08/09/19 1408 08/09/19 1415 08/09/19 1639  BP: 99/74 97/75  98/72  Pulse: (!) 107 (!) 103 (!) 104 (!) 105  Resp: 18   18  Temp:      TempSrc:      SpO2: 95%  98% 100%  Weight:      Height:        Intake/Output Summary (Last 24 hours) at 08/09/2019 1805 Last data filed at 08/09/2019 1127 Gross per 24 hour  Intake 240 ml  Output 700 ml  Net -460 ml   Filed Weights   08/08/19 0739 08/09/19 0401  Weight: 59 kg 63.5 kg    Examination:  General exam: Appears calm and comfortable wife at bedside Neck: +JVP Respiratory system: +rales, no wheezing Cardiovascular system: S1 & S2 heard,  RRR. No JVD, murmurs, rubs, gallops or clicks.  Gastrointestinal system: Abdomen is nondistended, soft and nontender.  Normal bowel sounds heard. Central nervous system: Alert and oriented. Grossly intact Extremities: trace edema b/l Skin warm and dry Psychiatry: Judgement and insight appear normal. Mood & affect appropriate.     Data Reviewed: I have personally reviewed following labs and imaging studies  CBC: Recent Labs  Lab 08/08/19 0757 08/09/19 0417  WBC 9.6 9.9  HGB 13.0 13.0  HCT 39.8 38.6*  MCV 97.8 95.8  PLT 231 219   Basic Metabolic Panel: Recent Labs  Lab 08/03/19 1627 08/08/19 0757 08/09/19 0417  NA 132* 128* 128*  K 5.1 4.9 5.1  CL 93* 93* 95*  CO2 23 25 24   GLUCOSE 272* 157* 203*  BUN 20 32* 35*  CREATININE 1.11 1.25* 1.04  CALCIUM 9.2 8.8* 8.5*   GFR: Estimated Creatinine Clearance: 54.3 mL/min (by C-G formula based on SCr of 1.04 mg/dL). Liver Function Tests: Recent Labs  Lab 08/08/19 0757  AST 36  ALT 41  ALKPHOS 214*  BILITOT 1.1  PROT 6.6  ALBUMIN 3.7   No results for input(s): LIPASE, AMYLASE in the last 168 hours. No results for input(s): AMMONIA in the last 168 hours. Coagulation Profile: No results for input(s): INR, PROTIME in the last 168 hours. Cardiac Enzymes: No results for input(s): CKTOTAL, CKMB, CKMBINDEX, TROPONINI in the last 168 hours. BNP (last 3 results) No results  for input(s): PROBNP in the last 8760 hours. HbA1C: Recent Labs    08/08/19 1139  HGBA1C 8.8*   CBG: Recent Labs  Lab 08/08/19 1720 08/08/19 2059 08/09/19 0749 08/09/19 1125 08/09/19 1639  GLUCAP 196* 211* 194* 236* 197*   Lipid Profile: No results for input(s): CHOL, HDL, LDLCALC, TRIG, CHOLHDL, LDLDIRECT in the last 72 hours. Thyroid Function Tests: No results for input(s): TSH, T4TOTAL, FREET4, T3FREE, THYROIDAB in the last 72 hours. Anemia Panel: No results for input(s): VITAMINB12, FOLATE, FERRITIN, TIBC, IRON, RETICCTPCT in the last 72  hours. Sepsis Labs: No results for input(s): PROCALCITON, LATICACIDVEN in the last 168 hours.  Recent Results (from the past 240 hour(s))  Respiratory Panel by RT PCR (Flu A&B, Covid) - Nasopharyngeal Swab     Status: None   Collection Time: 08/08/19  9:43 AM   Specimen: Nasopharyngeal Swab  Result Value Ref Range Status   SARS Coronavirus 2 by RT PCR NEGATIVE NEGATIVE Final    Comment: (NOTE) SARS-CoV-2 target nucleic acids are NOT DETECTED. The SARS-CoV-2 RNA is generally detectable in upper respiratoy specimens during the acute phase of infection. The lowest concentration of SARS-CoV-2 viral copies this assay can detect is 131 copies/mL. A negative result does not preclude SARS-Cov-2 infection and should not be used as the sole basis for treatment or other patient management decisions. A negative result may occur with  improper specimen collection/handling, submission of specimen other than nasopharyngeal swab, presence of viral mutation(s) within the areas targeted by this assay, and inadequate number of viral copies (<131 copies/mL). A negative result must be combined with clinical observations, patient history, and epidemiological information. The expected result is Negative. Fact Sheet for Patients:  https://www.moore.com/ Fact Sheet for Healthcare Providers:  https://www.young.biz/ This test is not yet ap proved or cleared by the [place] FDA and  has been authorized for detection and/or diagnosis of SARS-CoV-2 by FDA under an Emergency Use Authorization (EUA). This EUA will remain  in effect (meaning this test can be used) for the duration of the COVID-19 declaration under Section 564(b)(1) of the Act, 21 U.S.C. section 360bbb-3(b)(1), unless the authorization is terminated or revoked sooner.    Influenza A by PCR NEGATIVE NEGATIVE Final   Influenza B by PCR NEGATIVE NEGATIVE Final    Comment: (NOTE) The Xpert Xpress  SARS-CoV-2/FLU/RSV assay is intended as an aid in  the diagnosis of influenza from Nasopharyngeal swab specimens and  should not be used as a sole basis for treatment. Nasal washings and  aspirates are unacceptable for Xpert Xpress SARS-CoV-2/FLU/RSV  testing. Fact Sheet for Patients: https://www.moore.com/ Fact Sheet for Healthcare Providers: https://www.young.biz/ This test is not yet approved or cleared by the [place] FDA and  has been authorized for detection and/or diagnosis of SARS-CoV-2 by  FDA under an Emergency Use Authorization (EUA). This EUA will remain  in effect (meaning this test can be used) for the duration of the  Covid-19 declaration under Section 564(b)(1) of the Act, 21  U.S.C. section 360bbb-3(b)(1), unless the authorization is  terminated or revoked. Performed at [facility], [address] [place], [place] [address]          Radiology Studies: DG Chest Portable 1 View  Result Date: 08/08/2019 CLINICAL DATA:  Weakness, sick for 3 days, anorexia, fatigue, had COVID-19 4 months ago, question CHF EXAM: PORTABLE CHEST 1 VIEW COMPARISON:  Portable exam 0810 hours compared to 02/09/2019 FINDINGS: LEFT subclavian ICD with leads projecting over RIGHT atrium and RIGHT  ventricle. Enlargement of cardiac silhouette post CABG. Atherosclerotic calcification aorta. Scattered infiltrates in the mid to lower lungs, chronic, likely chronic interstitial lung disease with a peripheral and basilar predominance. No definite superimposed infiltrate or pleural effusion. No pneumothorax. IMPRESSION: Chronic interstitial changes in the periphery of both lungs favor chronic interstitial lung disease. Enlargement of cardiac silhouette post CABG and ICD. No acute abnormalities. Aortic Atherosclerosis (ICD10-I70.0). Electronically Signed   By: Lavonia Dana M.D.   On: 08/08/2019 08:28        Scheduled Meds: . aspirin EC  81 mg Oral  Daily  . atorvastatin  80 mg Oral QHS  . enoxaparin (LOVENOX) injection  40 mg Subcutaneous Q24H  . feeding supplement (GLUCERNA SHAKE)  237 mL Oral BID BM  . insulin aspart  0-15 Units Subcutaneous TID WC  . magnesium oxide  400 mg Oral Daily  . metoprolol succinate  12.5 mg Oral Daily  . mometasone-formoterol  2 puff Inhalation BID  . [START ON 08/10/2019] multivitamin with minerals  1 tablet Oral Daily  . omega-3 acid ethyl esters  1 g Oral Daily  . [START ON 08/10/2019] Ensure Max Protein  11 oz Oral Daily  . sodium chloride flush  3 mL Intravenous Q12H   Continuous Infusions: . sodium chloride      Assessment & Plan:   Principal Problem:   HFrEF (heart failure with reduced ejection fraction) (HCC) Active Problems:   Coronary artery disease   Type 2 diabetes mellitus with cardiac complication (HCC)   Interstitial pulmonary disease (HCC)   ICD (implantable cardioverter-defibrillator) in place   Acute on chronic systolic (congestive) heart failure (HCC)   Hyponatremia   Dehydration   Chronic systolic heart failure Last known LVEF of 15 to 20% Patient comes in for evaluation of fatigue and weakness and his systolic blood pressure is less than 90 mmHg Patient will likely need adjustment of his medications.  He was recently started on metoprolol and Entresto Cardiologist input was appreciated, defer diuretics due to hypotensive, hyponatremia, dehydration was started on IV fluids for gentle hydration with close monitoring of volume status and daily weight Defer echocardiogram as patient had an echo last month Hold Entresto at this time Lasix 20 mg as needed daily for signs of fluid overload Continue beta-blockers    Coronary artery disease with ischemic cardiomyopathy Continue aspirin, statins and beta-blockers. Patient is status post AICD insertion    Diabetes mellitus with stage III chronic kidney disease Continue consistent carbohydrate diet Place patient on  sliding scale insulin with NovoLog   Interstitial lung disease Continue as needed bronchodilator therapy Continue inhaled steroids   Hyponatremia Most likely from volume depletion and poor oral intake We will monitor closely   Dehydration Patient noted to have a bump in his serum creatinine from his baseline 1.11 >> 1.25 Encourage oral fluid intake   Needs Outpatient PT   DVT prophylaxis: Lovenox Code Status: DO NOT RESUSCITATE Family Communication: wife at bedside Disposition Plan: Back to previous home environment Barrier: needs ivf for hydration.         LOS: 1 day   Time spent: 45 minutes with more than 50% COC    Nolberto Hanlon, MD [facility] Pager [phone]  If 7PM-7AM, please contact night-coverage [email or web address] Password [facility] 08/09/2019, 6:05 PM

## 2019-08-10 LAB — GLUCOSE, CAPILLARY
Glucose-Capillary: 163 mg/dL — ABNORMAL HIGH (ref 70–99)
Glucose-Capillary: 95 mg/dL (ref 70–99)
Glucose-Capillary: 60 mg/dL — ABNORMAL LOW (ref 70–99)
Glucose-Capillary: 59 mg/dL — ABNORMAL LOW (ref 70–99)
Glucose-Capillary: 199 mg/dL — ABNORMAL HIGH (ref 70–99)

## 2019-08-10 LAB — CBC
HCT: 41.5 % (ref 39.0–52.0)
Hemoglobin: 14.1 g/dL (ref 13.0–17.0)
MCH: 32.8 pg (ref 26.0–34.0)
MCHC: 34 g/dL (ref 30.0–36.0)
MCV: 96.5 fL (ref 80.0–100.0)
Platelets: 200 10*3/uL (ref 150–400)
RBC: 4.3 MIL/uL (ref 4.22–5.81)
RDW: 14.4 % (ref 11.5–15.5)
WBC: 12.5 10*3/uL — ABNORMAL HIGH (ref 4.0–10.5)
nRBC: 0 % (ref 0.0–0.2)

## 2019-08-10 LAB — BASIC METABOLIC PANEL
Anion gap: 13 (ref 5–15)
BUN: 40 mg/dL — ABNORMAL HIGH (ref 8–23)
CO2: 21 mmol/L — ABNORMAL LOW (ref 22–32)
Calcium: 8.8 mg/dL — ABNORMAL LOW (ref 8.9–10.3)
Chloride: 95 mmol/L — ABNORMAL LOW (ref 98–111)
Creatinine, Ser: 1.33 mg/dL — ABNORMAL HIGH (ref 0.61–1.24)
GFR calc Af Amer: 60 mL/min — ABNORMAL LOW (ref >60)
GFR calc non Af Amer: 52 mL/min — ABNORMAL LOW (ref >60)
Glucose, Bld: 192 mg/dL — ABNORMAL HIGH (ref 70–99)
Potassium: 6.4 mmol/L (ref 3.5–5.1)
Sodium: 129 mmol/L — ABNORMAL LOW (ref 135–145)

## 2019-08-10 LAB — POTASSIUM
Potassium: 6.2 mmol/L — ABNORMAL HIGH (ref 3.5–5.1)
Potassium: 6 mmol/L — ABNORMAL HIGH (ref 3.5–5.1)
Potassium: 5.8 mmol/L — ABNORMAL HIGH (ref 3.5–5.1)

## 2019-08-10 MED ORDER — SODIUM BICARBONATE 8.4 % IV SOLN
INTRAVENOUS | Status: AC
  Administered 2019-08-10: 11:00:00 50 meq via INTRAVENOUS
  Filled 2019-08-10: qty 50

## 2019-08-10 MED ORDER — FUROSEMIDE 20 MG PO TABS: 20.0000 mg | ORAL_TABLET | Freq: Every day | ORAL | Status: AC | PRN

## 2019-08-10 MED ORDER — CALCIUM GLUCONATE-NACL 1-0.675 GM/50ML-% IV SOLN
1.0000 g | Freq: Once | INTRAVENOUS | Status: AC
  Administered 2019-08-10: 1000 mg via INTRAVENOUS
  Filled 2019-08-10: qty 50

## 2019-08-10 MED ORDER — DEXTROSE 50 % IV SOLN
50.0000 mL | Freq: Once | INTRAVENOUS | Status: AC
  Administered 2019-08-10: 50 mL via INTRAVENOUS
  Filled 2019-08-10: qty 50

## 2019-08-10 MED ORDER — METOPROLOL SUCCINATE ER 25 MG PO TB24: 12.5000 mg | ORAL_TABLET | Freq: Every day | ORAL | Status: AC

## 2019-08-10 MED ORDER — METOPROLOL TARTRATE 5 MG/5ML IV SOLN
INTRAVENOUS | Status: AC
  Filled 2019-08-10: qty 5

## 2019-08-10 MED ORDER — DEXTROSE-NACL 5-0.9 % IV SOLN: INTRAVENOUS | Status: DC

## 2019-08-10 MED ORDER — SODIUM BICARBONATE 8.4 % IV SOLN
50.0000 meq | Freq: Once | INTRAVENOUS | Status: AC
  Filled 2019-08-10: qty 50

## 2019-08-10 MED ORDER — INSULIN ASPART 100 UNIT/ML IV SOLN
10.0000 [IU] | Freq: Once | INTRAVENOUS | Status: AC
  Administered 2019-08-10: 19:00:00 10 [IU] via INTRAVENOUS
  Filled 2019-08-10: qty 0.1

## 2019-08-10 MED ORDER — DEXTROSE 50 % IV SOLN
1.0000 | Freq: Once | INTRAVENOUS | Status: AC
  Administered 2019-08-10: 50 mL via INTRAVENOUS
  Filled 2019-08-10: qty 50

## 2019-08-10 MED ORDER — SODIUM ZIRCONIUM CYCLOSILICATE 5 G PO PACK
10.0000 g | PACK | Freq: Once | ORAL | Status: AC
  Administered 2019-08-10: 10 g via ORAL
  Filled 2019-08-10: qty 2

## 2019-08-10 MED ORDER — INSULIN ASPART 100 UNIT/ML IV SOLN
10.0000 [IU] | Freq: Once | INTRAVENOUS | Status: AC
  Administered 2019-08-10: 10 [IU] via INTRAVENOUS
  Filled 2019-08-10: qty 0.1

## 2019-08-10 MED ORDER — INSULIN ASPART 100 UNIT/ML IV SOLN
10.0000 [IU] | Freq: Once | INTRAVENOUS | Status: AC
  Administered 2019-08-10: 06:00:00 10 [IU] via INTRAVENOUS
  Filled 2019-08-10: qty 0.1

## 2019-08-10 MED ORDER — METOPROLOL TARTRATE 5 MG/5ML IV SOLN
5.0000 mg | Freq: Once | INTRAVENOUS | Status: AC
  Administered 2019-08-10: 03:00:00 5 mg via INTRAVENOUS
  Filled 2019-08-10: qty 5

## 2019-08-10 NOTE — Progress Notes (Signed)
[facility]    SUBJECTIVE: The patient reports feeling poorly. He denies chest pain or shortness of breath while on supplemental oxygen via nasal cannula.   Vitals:   08/10/19 0400 08/10/19 0500 08/10/19 0600 08/10/19 0758  BP: 92/62  96/64 108/77  Pulse: 85 90 100 (!) 136  Resp: (!) 21 18 15    Temp: 97.6 F (36.4 C)     TempSrc: Axillary     SpO2: 98% (!) 87% 100%   Weight:      Height:         Intake/Output Summary (Last 24 hours) at 08/10/2019 10/10/2019 Last data filed at 08/09/2019 2030 Gross per 24 hour  Intake 480 ml  Output 300 ml  Net 180 ml      PHYSICAL EXAM  General: Ill-appearing, lying in bed, pallor, in no acute distress HEENT:  Normocephalic and atramatic Neck:  No JVD.  Lungs: left anterior lower lobe crackles, normal effort of breathing on room supplemental oxygen via [place] Heart: HRRR . Normal S1 and S2 without gallops or murmurs.  Abdomen: non-distended, soft Msk:  No obvious deformities Extremities: No clubbing, cyanosis or edema.   Neuro: Alert and oriented X 3.    LABS: Basic Metabolic Panel: Recent Labs    08/09/19 2029 08/10/19 0411  NA 124* 129*  K 5.3* 6.4*  CL 93* 95*  CO2 19* 21*  GLUCOSE 235* 192*  BUN 36* 40*  CREATININE 1.12 1.33*  CALCIUM 8.5* 8.8*  MG 2.1  --    Liver Function Tests: Recent Labs    08/08/19 0757  AST 36  ALT 41  ALKPHOS 214*  BILITOT 1.1  PROT 6.6  ALBUMIN 3.7   No results for input(s): LIPASE, AMYLASE in the last 72 hours. CBC: Recent Labs    08/09/19 0417 08/10/19 0411  WBC 9.9 12.5*  HGB 13.0 14.1  HCT 38.6* 41.5  MCV 95.8 96.5  PLT 219 200   Cardiac Enzymes: No results for input(s): CKTOTAL, CKMB, CKMBINDEX, TROPONINI in the last 72 hours. BNP: Invalid input(s): POCBNP D-Dimer: No results for input(s): DDIMER in the last 72 hours. Hemoglobin A1C: Recent Labs    08/08/19 1139  HGBA1C 8.8*   Fasting Lipid Panel: No results for input(s): CHOL, HDL, LDLCALC, TRIG, CHOLHDL,  LDLDIRECT in the last 72 hours. Thyroid Function Tests: No results for input(s): TSH, T4TOTAL, T3FREE, THYROIDAB in the last 72 hours.  Invalid input(s): FREET3 Anemia Panel: No results for input(s): VITAMINB12, FOLATE, FERRITIN, TIBC, IRON, RETICCTPCT in the last 72 hours.  DG Chest 1 View  Result Date: 08/09/2019 CLINICAL DATA:  Shortness of breath. EXAM: CHEST  1 VIEW COMPARISON:  08/08/2019 FINDINGS: Slight worsening lung aeration likely pulmonary edema. Underlying severe chronic lung disease. No definite pleural effusions. IMPRESSION: Worsening lung aeration, likely pulmonary edema. Electronically Signed   By: 10/08/2019 M.D.   On: 08/09/2019 20:35    TELEMETRY: sinus rhythm, 80 bpm  ASSESSMENT AND PLAN:  Principal Problem:   HFrEF (heart failure with reduced ejection fraction) (HCC) Active Problems:   Coronary artery disease   Type 2 diabetes mellitus with cardiac complication (HCC)   Interstitial pulmonary disease (HCC)   ICD (implantable cardioverter-defibrillator) in place   Acute on chronic systolic (congestive) heart failure (HCC)   Hyponatremia   Dehydration   Protein-calorie malnutrition, severe    1. Acute hypoxic respiratory failure, with underlying acute on chronic HFrEF, with LVEF less than 15%. Repeat chest xray yesterday with possible pulmonary edema. Patient appeared  dry and dehydrated on arrival with hyponatremia. BNP 3198. Received gentle hydration with 2 L fluid over 2 days with initial improvement of symptoms and creatinine. BNP today 3247. Entresto on hold due to hypotension. Metoprolol resumed. 2.Ischemic cardiomyopathy, status post dual-chamber ICD on 07/25/2019.  3.Coronary artery disease,status post CABG in 1991, MI with VF arrest in 1996, and coronary stents in 2002, without chest pain. ECG without evidence of ischemia. Troponin 37 and 35.  4.Interstitial lung disease,status post COVID, on nocturnal and PRN oxygen; currently on 6L nasal  cannula. 5.Hyponatremia, sodium 129 this morning  6. Insomnia, uncertain etiology 7. Poor oral intake, contributing to dehydration, tachycardia, hyponatremia, and hypotension. Minimal improvement after 2 days with total of 2 L normal saline, creatinine initially improved.  8. Hypotension, Entresto on hold, low dose metoprolol succinate 9. 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 10. Hyperkalemia, uncertain etiology. Kidney function mildly elevated.   Recommendations: 1. Due to patient's minimal improvement, persistent hypotension, hyponatremia, severe ischemic cardiomyopathy, and acute respiratory failure secondary to acute on chronic HFrEF, will plan to transfer patient to [facility] for advanced heart failure support and possible electrophysiology involvement as patient has possible recurrent AVNRT. Transfer line has been called. Awaiting call from admitting MD. 2. Continue to hold Entresto at this time, continue metoprolol succinate 12.5 mg daily 3. IV Lopressor PRN 2.5-5 mg for recurrent tachycardia 4. Defer Lasix at this time as patient is hypotensive and hyponatremic 5. Repeat STAT BMP for evaluation of hyperkalemia 6. Treatment of hyperkalemia if applicable per hospitalist    Clabe Seal, PA-C 08/10/2019 9:05 AM  The patient was also evaluated by Dr. Saralyn Pilar; plan made in collaboration with him.

## 2019-08-10 NOTE — Progress Notes (Signed)
Assisted tele visit to patient with family members.  Milana Salay Anderson, RN   

## 2019-08-10 NOTE — Progress Notes (Signed)
CRITICAL VALUE ALERT  Critical Value: K=6.4 Date & Time Notied: 08/10/19 005:07  Provider Notified:Ouma, E  Orders Received/Actions taken: yes

## 2019-08-10 NOTE — Progress Notes (Signed)
Assisted tele visit to patient with family member.  Bergen Magner Samson, RN  

## 2019-08-10 NOTE — Progress Notes (Signed)
10:35am - CH visited pt. while rounding on ICU; pt. lying down in bed w/wife [facility] @ bedside.  Pt. seemed exhausted and overwrought --asked for prayer; said he wants to 'get out of here' by whatever transportation means possible.  Pt. shared he is scheduled to be transferred to [facility] and is awaiting a bed.  CH offered prayer for divine peace and presence for pt. and wife.  3pm - Pt. motioned CH and [facility] Norman into rm. while chaplains were passing throughICU hallway; pt. requested additional prayer; seemed worried and agitated.  CH checked w/RN after visit w/pt. re: [facility] transfer status.  Still awaiting a bed.  4:20pm - Pt.'s wife came to ICU RN station inquiring if pt. can have food (pt. said he was told he was not allowed); CH checked w/RN per ICU sec. request and learned pt. can indeed have food if desired.  Pt. did not want food when [facility] visited to relay news, however.  Pt. continues to be restless and agitated; eager to go to [facility];  wife says he has not eaten anything in a few days.  Wife also shared pt. had COVID in October, which led to heart and lung issues as well as change in taste that has rendered all food unpalatable; had defibrillator put in early April --> health has declined since, wife shared.  CH will continue to monitor to assess pt. needs.    08/10/19 1600  Clinical Encounter Type  Visited With Patient and family together  Visit Type Initial;Psychological support;Spiritual support;Social support;Critical Care  Referral From Family;Chaplain;Other (Comment) (Routine Rounding)  Spiritual Encounters  Spiritual Needs Emotional;Prayer  Stress Factors  Patient Stress Factors Exhausted;Lack of knowledge;Health changes;Loss of control;Major life changes  Family Stress Factors Major life changes;Health changes

## 2019-08-10 NOTE — Discharge Summary (Signed)
Physician Discharge Summary  Matthew Wolf PNT:[id number] DOB: 08/20/1945 DOA: 08/08/2019  PCP: [name], NP  Admit date: 08/08/2019 Discharge date: 08/10/2019  Admitted From: Home Disposition: [facility]   Equipment/Devices: Oxygen via nasal cannula, 5 L  Discharge Condition: Guarded CODE STATUS: DNR Diet recommendation: Heart Healthy / Carb Modified  Brief/Interim Summary: HPI: Matthew Wolf is a 74 y.o. male with medical history significant for coronary artery disease with ischemic cardiomyopathy, history of COVID-19 infection about 6 months ago, hypertension, diabetes mellitus and chronic kidney disease who presents to the emergency room for evaluation of fatigue, generalized weakness and poor oral intake.  He denies having any chest pain, no shortness of breath when compared to his baseline, no nausea, no vomiting, no diaphoresis or palpitation. Patient is status post dual-chamber AICD insertion on 07/25/19 for ischemic cardiomyopathy as well as AV nodal reentry tachycardia. Patient had a 2D echocardiogram on 07/07/19 which shows severely depressed LV dysfunction with LVEF of 20%. Chest x-ray shows chronic interstitial changes in the periphery of both lungs favor chronic interstitial lung disease. Enlargement of cardiac silhouette post CABG and ICD. Twelve-lead EKG shows sinus rhythm with PVCs. Labs reveal a serum sodium of 128, serum creatinine (baseline 0.8) and elevated BNP > 3,000  5/5: Patient was transferred to the intensive care unit on 08/09/2019 given concerns for increasing oxygen requirement, decreasing blood pressure, increased work of breathing.  Patient was followed closely by cardiology.  Patient was evaluated by myself and cardiology this morning.  1 dose of metoprolol given per cardiology recommendations.  Blood pressure decreased however resolved spontaneously.  Furosemide changed to as needed given concerns for hypotension.  Home Entresto was held.   After discussion with cardiology will arrange for transfer to [facility] for advanced heart failure care due to minimal improvement, persistent hypotension, hyponatremia, severe ischemic cardiomyopathy, acute respiratory failure.  Possible electrophysiology involvement as well as patient has potential recurrent AVNRT.  Cardiology recs as follows: Continue to hold Entresto Continue metoprolol succinate 12.5 mg daily IV Lopressor as needed 2.5 to 5 mg for recurrent tachycardia Defer Lasix at this time, made as needed on discharge medication reconciliation Medical treatment for hyperkalemia  Discharge Diagnoses:  Principal Problem:   HFrEF (heart failure with reduced ejection fraction) ([place]) Active Problems:   Coronary artery disease   Type 2 diabetes mellitus with cardiac complication ([place])   Interstitial pulmonary disease ([place])   ICD (implantable cardioverter-defibrillator) in place   Acute on chronic systolic (congestive) heart failure (H[facility])   Hyponatremia   Dehydration   Protein-calorie malnutrition, severe  Severe ischemic cardiomyopathy Acute on chronic systolic heart failure Hyponatremia Hypotension See above for hospital course and cardiology recommendations Entresto on hold Continue metoprolol Lasix on hold Transfer to [place] for advanced heart failure services  Coronary artery disease On aspirin, statin, beta-blocker Status post AICD insertion  Diabetes mellitus type 2 with concomitant stage III chronic kidney disease Creatinine at baseline Sliding scale insulin coverage Avoid long-acting given poor p.o. intake  Chronic interstitial lung disease On inhaled home corticosteroids As needed bronchodilators   Discharge Instructions  Discharge Instructions    Diet - low sodium heart healthy   Complete by: As directed    Increase activity slowly   Complete by: As directed      Allergies as of 08/10/2019   No Known Allergies     Medication List     STOP taking these medications   Jardiance 25 MG Tabs tablet Generic drug:  empagliflozin   naproxen sodium 220 MG tablet Commonly known as: ALEVE     TAKE these medications   albuterol 108 (90 Base) MCG/ACT inhaler Commonly known as: VENTOLIN HFA TAKE 2 PUFFS BY MOUTH EVERY 6 HOURS AS NEEDED FOR WHEEZE OR SHORTNESS OF BREATH What changed: See the new instructions.   aspirin EC 81 MG tablet Take 81 mg by mouth daily.   atorvastatin 80 MG tablet Commonly known as: LIPITOR Take 80 mg by mouth at bedtime.   FISH OIL PO Take 1 capsule by mouth daily.   furosemide 20 MG tablet Commonly known as: LASIX Take 1 tablet (20 mg total) by mouth daily as needed (shortness of breath, orthopnea, edema, weight gain of 2 or more pounds overnight). What changed:   when to take this  reasons to take this   insulin degludec 100 UNIT/ML FlexTouch Pen Commonly known as: TRESIBA Inject 0.1 mLs (10 Units total) into the skin daily.   Insulin Pen Needle 32G X 4 MM Misc 1 kit by Does not apply route daily.   magnesium oxide 400 MG tablet Commonly known as: MAG-OX Take 400 mg by mouth daily.   metFORMIN 500 MG tablet Commonly known as: GLUCOPHAGE Take 500 MG by mouth (one tablet) at noon and 1000 MG by mouth (2 tablets) at 1800. What changed:   how much to take  how to take this  when to take this  additional instructions   metoprolol succinate 25 MG 24 hr tablet Commonly known as: TOPROL-XL Take 0.5 tablets (12.5 mg total) by mouth daily. Start taking on: Aug 11, 2019 What changed:   medication strength  how much to take  when to take this   nortriptyline 25 MG capsule Commonly known as: PAMELOR TAKE 1 CAPSULE (25 MG TOTAL) BY MOUTH 2 (TWO) TIMES DAILY.   sacubitril-valsartan 24-26 MG Commonly known as: ENTRESTO Take 1 tablet by mouth 2 (two) times daily.   Symbicort 160-4.5 MCG/ACT inhaler Generic drug: budesonide-formoterol TAKE 2 PUFFS BY MOUTH TWICE A DAY What  changed: See the new instructions.      Follow-up Information    [facility] up on 08/17/2019.   Specialty: Cardiology Why: at 9:30am. Enter through the [place] entrance Contact information: [place] [phone]         No Known Allergies  Consultations:  Cardiology- [facility] ([place])   Procedures/Studies: DG Chest 1 View  Result Date: 08/09/2019 CLINICAL DATA:  Shortness of breath. EXAM: CHEST  1 VIEW COMPARISON:  08/08/2019 FINDINGS: Slight worsening lung aeration likely pulmonary edema. Underlying severe chronic lung disease. No definite pleural effusions. IMPRESSION: Worsening lung aeration, likely pulmonary edema. Electronically Signed   By: Marijo Sanes M.D.   On: 08/09/2019 20:35   DG Chest Portable 1 View  Result Date: 08/08/2019 CLINICAL DATA:  Weakness, sick for 3 days, anorexia, fatigue, had COVID-19 4 months ago, question CHF EXAM: PORTABLE CHEST 1 VIEW COMPARISON:  Portable exam 0810 hours compared to 02/09/2019 FINDINGS: LEFT subclavian ICD with leads projecting over RIGHT atrium and RIGHT ventricle. Enlargement of cardiac silhouette post CABG. Atherosclerotic calcification aorta. Scattered infiltrates in the mid to lower lungs, chronic, likely chronic interstitial lung disease with a peripheral and basilar predominance. No definite superimposed infiltrate or pleural effusion. No pneumothorax. IMPRESSION: Chronic interstitial changes in the periphery of both lungs favor chronic interstitial lung disease. Enlargement of cardiac silhouette post CABG and ICD.  No acute abnormalities. Aortic Atherosclerosis (ICD10-I70.0). Electronically Signed   By: Lavonia Dana M.D.   On: 08/08/2019 08:28   (Echo, Carotid, EGD, Colonoscopy, ERCP)    Subjective: Patient seen and examined Remains in stepdown status Discussed with cardiology, transfer to [facility]  Discharge Exam: Vitals:   08/10/19 0900 08/10/19 1000  BP: (!) 79/59 (!) 78/57  Pulse:  82  Resp: (!) 22 14  Temp:    SpO2:  100%   Vitals:   08/10/19 0758 08/10/19 0800 08/10/19 0900 08/10/19 1000  BP: 108/77 (!) 87/73 (!) 79/59 (!) 78/57  Pulse: (!) 136 (!) 136  82  Resp:  19 (!) 22 14  Temp:  99.5 F (37.5 C)    TempSrc:  Axillary    SpO2:  100%  100%  Weight:      Height:        General: Mild respiratory distress, appears chronically ill, frail Cardiovascular: Tachycardic, regular rhythm Respiratory: Bilateral rhonchi, increased work of breathing Abdominal: Soft, NT, ND, bowel sounds + Extremities: no edema, no cyanosis, diffuse muscle wasting    The results of significant diagnostics from this hospitalization (including imaging, microbiology, ancillary and laboratory) are listed below for reference.     Microbiology: Recent Results (from the past 240 hour(s))  Respiratory Panel by RT PCR (Flu A&B, Covid) - Nasopharyngeal Swab     Status: None   Collection Time: 08/08/19  9:43 AM   Specimen: Nasopharyngeal Swab  Result Value Ref Range Status   SARS Coronavirus 2 by RT PCR NEGATIVE NEGATIVE Final    Comment: (NOTE) SARS-CoV-2 target nucleic acids are NOT DETECTED. The SARS-CoV-2 RNA is generally detectable in upper respiratoy specimens during the acute phase of infection. The lowest concentration of SARS-CoV-2 viral copies this assay can detect is 131 copies/mL. A negative result does not preclude SARS-Cov-2 infection and should not be used as the sole basis for treatment or other patient management decisions. A negative result may occur with  improper specimen collection/handling, submission of specimen other than nasopharyngeal swab, presence of viral mutation(s) within the areas targeted by this assay, and inadequate number of viral copies (<131 copies/mL). A negative result must be combined with clinical observations, patient history, and  epidemiological information. The expected result is Negative. Fact Sheet for Patients:  PinkCheek.be Fact Sheet for Healthcare Providers:  GravelBags.it This test is not yet ap proved or cleared by the [place] FDA and  has been authorized for detection and/or diagnosis of SARS-CoV-2 by FDA under an Emergency Use Authorization (EUA). This EUA will remain  in effect (meaning this test can be used) for the duration of the COVID-19 declaration under Section 564(b)(1) of the Act, 21 U.S.C. section 360bbb-3(b)(1), unless the authorization is terminated or revoked sooner.    Influenza A by PCR NEGATIVE NEGATIVE Final   Influenza B by PCR NEGATIVE NEGATIVE Final    Comment: (NOTE) The Xpert Xpress SARS-CoV-2/FLU/RSV assay is intended as an aid in  the diagnosis of influenza from Nasopharyngeal swab specimens and  should not be used as a sole basis for treatment. Nasal washings and  aspirates are unacceptable for Xpert Xpress SARS-CoV-2/FLU/RSV  testing. Fact Sheet for Patients: PinkCheek.be Fact Sheet for Healthcare Providers: GravelBags.it This test is not yet approved or cleared by the [place] FDA and  has been authorized for detection and/or diagnosis of SARS-CoV-2 by  FDA under an Emergency Use Authorization (EUA). This EUA will remain  in effect (meaning this test can  be used) for the duration of the  Covid-19 declaration under Section 564(b)(1) of the Act, 21  U.S.C. section 360bbb-3(b)(1), unless the authorization is  terminated or revoked. Performed at [facility], [address] [place], [place] [address]   MRSA PCR Screening     Status: None   Collection Time: 08/09/19  9:11 PM   Specimen: Nasal Mucosa; Nasopharyngeal  Result Value Ref Range Status   MRSA by PCR NEGATIVE NEGATIVE Final    Comment:        The GeneXpert MRSA Assay  (FDA approved for NASAL specimens only), is one component of a comprehensive MRSA colonization surveillance program. It is not intended to diagnose MRSA infection nor to guide or monitor treatment for MRSA infections. Performed at [facility], [address] [place], [place] [address]      Labs: BNP (last 3 results) Recent Labs    01/27/19 0530 08/08/19 0757 08/09/19 2037  BNP 940.6* 3,198.0* 7,517.0*   Basic Metabolic Panel: Recent Labs  Lab 08/03/19 1627 08/03/19 1627 08/08/19 0757 08/09/19 0417 08/09/19 2029 08/10/19 0411 08/10/19 1011  NA 132*  --  128* 128* 124* 129*  --   K 5.1   < > 4.9 5.1 5.3* 6.4* 6.2*  CL 93*  --  93* 95* 93* 95*  --   CO2 23  --  25 24 19* 21*  --   GLUCOSE 272*  --  157* 203* 235* 192*  --   BUN 20  --  32* 35* 36* 40*  --   CREATININE 1.11  --  1.25* 1.04 1.12 1.33*  --   CALCIUM 9.2  --  8.8* 8.5* 8.5* 8.8*  --   MG  --   --   --   --  2.1  --   --    < > = values in this interval not displayed.   Liver Function Tests: Recent Labs  Lab 08/08/19 0757  AST 36  ALT 41  ALKPHOS 214*  BILITOT 1.1  PROT 6.6  ALBUMIN 3.7   No results for input(s): LIPASE, AMYLASE in the last 168 hours. No results for input(s): AMMONIA in the last 168 hours. CBC: Recent Labs  Lab 08/08/19 0757 08/09/19 0417 08/10/19 0411  WBC 9.6 9.9 12.5*  HGB 13.0 13.0 14.1  HCT 39.8 38.6* 41.5  MCV 97.8 95.8 96.5  PLT 231 219 200   Cardiac Enzymes: No results for input(s): CKTOTAL, CKMB, CKMBINDEX, TROPONINI in the last 168 hours. BNP: Invalid input(s): POCBNP CBG: Recent Labs  Lab 08/09/19 1639 08/09/19 2016 08/09/19 2122 08/10/19 0716 08/10/19 1113  GLUCAP 197* 189* 167* 163* 95   D-Dimer No results for input(s): DDIMER in the last 72 hours. Hgb A1c Recent Labs    08/08/19 1139  HGBA1C 8.8*   Lipid Profile No results for input(s): CHOL, HDL, LDLCALC, TRIG, CHOLHDL, LDLDIRECT in the last 72 hours. Thyroid function  studies No results for input(s): TSH, T4TOTAL, T3FREE, THYROIDAB in the last 72 hours.  Invalid input(s): FREET3 Anemia work up No results for input(s): VITAMINB12, FOLATE, FERRITIN, TIBC, IRON, RETICCTPCT in the last 72 hours. Urinalysis    Component Value Date/Time   COLORURINE YELLOW 08/08/2019 1126   APPEARANCEUR CLOUDY (A) 08/08/2019 1126   LABSPEC 1.025 08/08/2019 1126   PHURINE 5.0 08/08/2019 1126   GLUCOSEU 100 (A) 08/08/2019 1126   HGBUR NEGATIVE 08/08/2019 1126   BILIRUBINUR SMALL (A) 08/08/2019 1126   [place] (A) 08/08/2019 1126  PROTEINUR 100 (A) 08/08/2019 1126   NITRITE NEGATIVE 08/08/2019 1126   LEUKOCYTESUR SMALL (A) 08/08/2019 1126   Sepsis Labs Invalid input(s): PROCALCITONIN,  WBC,  LACTICIDVEN Microbiology Recent Results (from the past 240 hour(s))  Respiratory Panel by RT PCR (Flu A&B, Covid) - Nasopharyngeal Swab     Status: None   Collection Time: 08/08/19  9:43 AM   Specimen: Nasopharyngeal Swab  Result Value Ref Range Status   SARS Coronavirus 2 by RT PCR NEGATIVE NEGATIVE Final    Comment: (NOTE) SARS-CoV-2 target nucleic acids are NOT DETECTED. The SARS-CoV-2 RNA is generally detectable in upper respiratoy specimens during the acute phase of infection. The lowest concentration of SARS-CoV-2 viral copies this assay can detect is 131 copies/mL. A negative result does not preclude SARS-Cov-2 infection and should not be used as the sole basis for treatment or other patient management decisions. A negative result may occur with  improper specimen collection/handling, submission of specimen other than nasopharyngeal swab, presence of viral mutation(s) within the areas targeted by this assay, and inadequate number of viral copies (<131 copies/mL). A negative result must be combined with clinical observations, patient history, and epidemiological information. The expected result is Negative. Fact Sheet for Patients:   PinkCheek.be Fact Sheet for Healthcare Providers:  GravelBags.it This test is not yet ap proved or cleared by the [place] FDA and  has been authorized for detection and/or diagnosis of SARS-CoV-2 by FDA under an Emergency Use Authorization (EUA). This EUA will remain  in effect (meaning this test can be used) for the duration of the COVID-19 declaration under Section 564(b)(1) of the Act, 21 U.S.C. section 360bbb-3(b)(1), unless the authorization is terminated or revoked sooner.    Influenza A by PCR NEGATIVE NEGATIVE Final   Influenza B by PCR NEGATIVE NEGATIVE Final    Comment: (NOTE) The Xpert Xpress SARS-CoV-2/FLU/RSV assay is intended as an aid in  the diagnosis of influenza from Nasopharyngeal swab specimens and  should not be used as a sole basis for treatment. Nasal washings and  aspirates are unacceptable for Xpert Xpress SARS-CoV-2/FLU/RSV  testing. Fact Sheet for Patients: PinkCheek.be Fact Sheet for Healthcare Providers: GravelBags.it This test is not yet approved or cleared by the [place] FDA and  has been authorized for detection and/or diagnosis of SARS-CoV-2 by  FDA under an Emergency Use Authorization (EUA). This EUA will remain  in effect (meaning this test can be used) for the duration of the  Covid-19 declaration under Section 564(b)(1) of the Act, 21  U.S.C. section 360bbb-3(b)(1), unless the authorization is  terminated or revoked. Performed at [facility], [address] [place], [place] [address]   MRSA PCR Screening     Status: None   Collection Time: 08/09/19  9:11 PM   Specimen: Nasal Mucosa; Nasopharyngeal  Result Value Ref Range Status   MRSA by PCR NEGATIVE NEGATIVE Final    Comment:        The GeneXpert MRSA Assay (FDA approved for NASAL specimens only), is one component of a comprehensive MRSA  colonization surveillance program. It is not intended to diagnose MRSA infection nor to guide or monitor treatment for MRSA infections. Performed at [facility], [address], [place], [place] [address]      Time coordinating discharge: Over 30 minutes  SIGNED:   Sidney Ace, MD  [facility] 08/10/2019, 11:56 AM Pager   If 7PM-7AM, please contact night-coverage

## 2019-08-10 NOTE — Progress Notes (Signed)
CH paged to ICU by secretary to console pt.'s wife; wife grieved/frustrated at not being able to have her son in [facility] parking lot visit per COVID limit on visitors.  When [facility] arrived at pt.s' rm. wife said she was now doing OK; she called son and told him he could go home.  Pt. in bed w/dtr. @ bedside; Family is planning to coordinate video call later this evening possibly.  Still no word re: transfer to [facility] acc. to wife.  CH remains available as needed.    08/10/19 1800  Clinical Encounter Type  Visited With Patient and family together  Visit Type Follow-up;Psychological support;Social support;Critical Care  Referral From Nurse  Spiritual Encounters  Spiritual Needs Emotional  Stress Factors  Patient Stress Factors Health changes;Lack of knowledge;Loss of control;Major life changes  Family Stress Factors Loss of control;Major life changes;Health changes

## 2019-08-11 ENCOUNTER — Inpatient Hospital Stay: Payer: Medicare HMO

## 2019-08-11 ENCOUNTER — Other Ambulatory Visit: Payer: Self-pay | Admitting: Pharmacy Technician

## 2019-08-11 DIAGNOSIS — I5023 Acute on chronic systolic (congestive) heart failure: Secondary | ICD-10-CM

## 2019-08-11 DIAGNOSIS — Z515 Encounter for palliative care: Secondary | ICD-10-CM

## 2019-08-11 DIAGNOSIS — E1159 Type 2 diabetes mellitus with other circulatory complications: Secondary | ICD-10-CM

## 2019-08-11 DIAGNOSIS — Z7189 Other specified counseling: Secondary | ICD-10-CM

## 2019-08-11 DIAGNOSIS — Z66 Do not resuscitate: Secondary | ICD-10-CM

## 2019-08-11 DIAGNOSIS — J849 Interstitial pulmonary disease, unspecified: Secondary | ICD-10-CM | POA: Diagnosis not present

## 2019-08-11 DIAGNOSIS — Z95810 Presence of automatic (implantable) cardiac defibrillator: Secondary | ICD-10-CM

## 2019-08-11 DIAGNOSIS — I5020 Unspecified systolic (congestive) heart failure: Secondary | ICD-10-CM

## 2019-08-11 DIAGNOSIS — I2510 Atherosclerotic heart disease of native coronary artery without angina pectoris: Secondary | ICD-10-CM

## 2019-08-11 DIAGNOSIS — I509 Heart failure, unspecified: Secondary | ICD-10-CM

## 2019-08-11 LAB — BASIC METABOLIC PANEL
Anion gap: 11 (ref 5–15)
BUN: 58 mg/dL — ABNORMAL HIGH (ref 8–23)
CO2: 23 mmol/L (ref 22–32)
Calcium: 8.5 mg/dL — ABNORMAL LOW (ref 8.9–10.3)
Chloride: 91 mmol/L — ABNORMAL LOW (ref 98–111)
Creatinine, Ser: 2.17 mg/dL — ABNORMAL HIGH (ref 0.61–1.24)
GFR calc Af Amer: 33 mL/min — ABNORMAL LOW (ref >60)
GFR calc non Af Amer: 29 mL/min — ABNORMAL LOW (ref >60)
Glucose, Bld: 249 mg/dL — ABNORMAL HIGH (ref 70–99)
Potassium: 5.8 mmol/L — ABNORMAL HIGH (ref 3.5–5.1)
Sodium: 125 mmol/L — ABNORMAL LOW (ref 135–145)

## 2019-08-11 LAB — CBC WITH DIFFERENTIAL/PLATELET
Abs Immature Granulocytes: 0.12 10*3/uL — ABNORMAL HIGH (ref 0.00–0.07)
Basophils Absolute: 0 10*3/uL (ref 0.0–0.1)
Basophils Relative: 0 %
Eosinophils Absolute: 0 10*3/uL (ref 0.0–0.5)
Eosinophils Relative: 0 %
HCT: 34.5 % — ABNORMAL LOW (ref 39.0–52.0)
Hemoglobin: 11.6 g/dL — ABNORMAL LOW (ref 13.0–17.0)
Immature Granulocytes: 1 %
Lymphocytes Relative: 7 %
Lymphs Abs: 1 10*3/uL (ref 0.7–4.0)
MCH: 31.8 pg (ref 26.0–34.0)
MCHC: 33.6 g/dL (ref 30.0–36.0)
MCV: 94.5 fL (ref 80.0–100.0)
Monocytes Absolute: 0.9 10*3/uL (ref 0.1–1.0)
Monocytes Relative: 7 %
Neutro Abs: 11.5 10*3/uL — ABNORMAL HIGH (ref 1.7–7.7)
Neutrophils Relative %: 85 %
Platelets: 96 10*3/uL — ABNORMAL LOW (ref 150–400)
RBC: 3.65 MIL/uL — ABNORMAL LOW (ref 4.22–5.81)
RDW: 14.4 % (ref 11.5–15.5)
WBC: 13.5 10*3/uL — ABNORMAL HIGH (ref 4.0–10.5)
nRBC: 0.1 % (ref 0.0–0.2)

## 2019-08-11 LAB — GLUCOSE, CAPILLARY
Glucose-Capillary: 223 mg/dL — ABNORMAL HIGH (ref 70–99)
Glucose-Capillary: 272 mg/dL — ABNORMAL HIGH (ref 70–99)
Glucose-Capillary: 270 mg/dL — ABNORMAL HIGH (ref 70–99)
Glucose-Capillary: 217 mg/dL — ABNORMAL HIGH (ref 70–99)

## 2019-08-11 LAB — MAGNESIUM: Magnesium: 2.4 mg/dL (ref 1.7–2.4)

## 2019-08-11 MED ORDER — PATIROMER SORBITEX CALCIUM 8.4 G PO PACK
8.4000 g | PACK | Freq: Every day | ORAL | Status: DC
  Filled 2019-08-11: qty 1

## 2019-08-11 MED ORDER — PATIROMER SORBITEX CALCIUM 8.4 G PO PACK
16.8000 g | PACK | Freq: Every day | ORAL | Status: DC
  Administered 2019-08-11 – 2019-08-12 (×2): 16.8 g via ORAL
  Filled 2019-08-11 (×4): qty 2

## 2019-08-11 MED ORDER — ENOXAPARIN SODIUM 30 MG/0.3ML ~~LOC~~ SOLN: 30.0000 mg | SUBCUTANEOUS | Status: DC

## 2019-08-11 MED ORDER — MIDODRINE HCL 5 MG PO TABS
10.0000 mg | ORAL_TABLET | Freq: Three times a day (TID) | ORAL | Status: DC
  Administered 2019-08-11 – 2019-08-13 (×8): 10 mg via ORAL
  Filled 2019-08-11 (×8): qty 2

## 2019-08-11 MED ORDER — DOBUTAMINE IN D5W 4-5 MG/ML-% IV SOLN
5.0000 ug/kg/min | INTRAVENOUS | Status: DC
  Administered 2019-08-11 – 2019-08-13 (×2): 5 ug/kg/min via INTRAVENOUS
  Filled 2019-08-11 (×2): qty 250

## 2019-08-11 MED ORDER — DOPAMINE-DEXTROSE 3.2-5 MG/ML-% IV SOLN
2.5000 ug/kg/min | INTRAVENOUS | Status: DC
  Administered 2019-08-11: 2.5 ug/kg/min via INTRAVENOUS
  Filled 2019-08-11: qty 250

## 2019-08-11 NOTE — Consult Note (Signed)
[address] [place], [place] [address] Phone [phone]. Fax [phone]  Date: 08/11/2019                  Patient Name:  Matthew Wolf  MRN: [id number]  DOB: [birth year]  Age / Sex: 74 y.o., male         PCP: [name], NP                 Service Requesting Consult: IM/ Sidney Ace, MD                 Reason for Consult: ARF, hyperkalemia            History of Present Illness: Patient is a 74 y.o. male with medical problems of Coronary disease, ischemic cardiomyopathy, CABG, history of COVID-19 infection in October, hypertension, diabetes, chronic kidney disease, who was admitted to [facility] on 08/08/2019 for evaluation of Hyponatremia [E87.1] Other fatigue [R53.83] Acute on chronic heart failure, unspecified heart failure type ([place]) [I50.9] Acute on chronic systolic (congestive) heart failure ([place]) [I50.23]  Patient's wife is at bedside and assists in obtaining history Patient underwent dual-chamber AICD placement at [facility] on April 19 for 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  He has severely depressed LV EF of 20%. Currently diuresis is unsuccessful due to hypotension Patient will also worsening hyponatremia, hypernatremia Nephrology consult has now been requested for assistance with diuresis    Medications: Outpatient medications: Medications Prior to Admission  Medication Sig Dispense Refill Last Dose  . albuterol (VENTOLIN HFA) 108 (90 Base) MCG/ACT inhaler TAKE 2 PUFFS BY MOUTH EVERY 6 HOURS AS NEEDED FOR WHEEZE OR SHORTNESS OF BREATH (Patient taking differently: Inhale 2 puffs into the lungs every 6 (six) hours as needed for wheezing or shortness of breath. ) 108 g 8 Unknown at PRN  . aspirin EC 81 MG tablet Take 81 mg by mouth daily.      [name] atorvastatin (LIPITOR) 80 MG tablet Take 80 mg by mouth at bedtime.    08/07/2019 at 2000  . empagliflozin (JARDIANCE) 25 MG TABS tablet Take 25 mg by mouth daily before breakfast. 90  tablet 3 08/07/2019 at 0600  . furosemide (LASIX) 20 MG tablet Take 20 mg by mouth daily.    08/07/2019 at 0600  . insulin degludec (TRESIBA) 100 UNIT/ML FlexTouch Pen Inject 0.1 mLs (10 Units total) into the skin daily. 3 pen 5 08/07/2019 at 1900  . Insulin Pen Needle 32G X 4 MM MISC 1 kit by Does not apply route daily. 100 each 5   . magnesium oxide (MAG-OX) 400 MG tablet Take 400 mg by mouth daily.     . metFORMIN (GLUCOPHAGE) 500 MG tablet Take 500 MG by mouth (one tablet) at noon and 1000 MG by mouth (2 tablets) at 1800. (Patient taking differently: Take 500-1,000 mg by mouth See admin instructions. Take 1 tablet (573m) by mouth at breakfast and take 2 tablets (10031m by mouth every evening at supper) 270 tablet 3 08/07/2019 at 1900  . metoprolol succinate (TOPROL-XL) 50 MG 24 hr tablet Take 50 mg by mouth at bedtime.    08/07/2019 at 2000  . naproxen sodium (ALEVE) 220 MG tablet Take 220 mg by mouth 2 (two) times daily as needed (pain).   Unknown at PRN  . nortriptyline (PAMELOR) 25 MG capsule TAKE 1 CAPSULE (25 MG TOTAL) BY MOUTH 2 (TWO) TIMES DAILY. 180 capsule 0 08/07/2019 at 1900  . Omega-3 Fatty Acids (FISH OIL  PO) Take 1 capsule by mouth daily.      . sacubitril-valsartan (ENTRESTO) 24-26 MG Take 1 tablet by mouth 2 (two) times daily.   08/07/2019 at 1900  . SYMBICORT 160-4.5 MCG/ACT inhaler TAKE 2 PUFFS BY MOUTH TWICE A DAY (Patient taking differently: Inhale 2 puffs into the lungs 2 (two) times daily. ) 10.2 g 3 08/07/2019 at 1900    Current medications: Current Facility-Administered Medications  Medication Dose Route Frequency Provider Last Rate Last Admin  . 0.9 %  sodium chloride infusion  250 mL Intravenous PRN Agbata, Tochukwu, MD      . acetaminophen (TYLENOL) tablet 650 mg  650 mg Oral Q6H PRN Agbata, Tochukwu, MD       Or  . acetaminophen (TYLENOL) suppository 650 mg  650 mg Rectal Q6H PRN Agbata, Tochukwu, MD      . aspirin EC tablet 81 mg  81 mg Oral Daily Agbata, Tochukwu, MD   81 mg  at 08/11/19 0828  . atorvastatin (LIPITOR) tablet 80 mg  80 mg Oral QHS Agbata, Tochukwu, MD   80 mg at 08/10/19 2123  . Chlorhexidine Gluconate Cloth 2 % PADS 6 each  6 each Topical Daily Nolberto Hanlon, MD   6 each at 08/11/19 [phone]  . dextrose 5 %-0.9 % sodium chloride infusion   Intravenous Continuous Ralene Muskrat B, MD 50 mL/hr at 08/10/19 1800 Rate Verify at 08/10/19 1800  . enoxaparin (LOVENOX) injection 30 mg  30 mg Subcutaneous Q24H Dallie Piles, [facility]      . feeding supplement (GLUCERNA SHAKE) (GLUCERNA SHAKE) liquid 237 mL  237 mL Oral BID BM Nolberto Hanlon, MD   237 mL at 08/11/19 0829  . furosemide (LASIX) tablet 20 mg  20 mg Oral Daily PRN Clabe Seal, PA-C   Stopped at 08/09/19 1955  . insulin aspart (novoLOG) injection 0-15 Units  0-15 Units Subcutaneous TID WC Agbata, Tochukwu, MD   5 Units at 08/11/19 0828  . levalbuterol (XOPENEX) nebulizer solution 0.63 mg  0.63 mg Nebulization Q8H PRN Lang Snow, NP      . magnesium oxide (MAG-OX) tablet 400 mg  400 mg Oral Daily Agbata, Tochukwu, MD   400 mg at 08/11/19 0828  . metoprolol succinate (TOPROL-XL) 24 hr tablet 12.5 mg  12.5 mg Oral Daily Clabe Seal, PA-C   12.5 mg at 08/11/19 1287  . midodrine (PROAMATINE) tablet 10 mg  10 mg Oral TID WC Sreenath, Sudheer B, MD      . mometasone-formoterol (DULERA) 200-5 MCG/ACT inhaler 2 puff  2 puff Inhalation BID Agbata, Tochukwu, MD   2 puff at 08/11/19 0830  . multivitamin with minerals tablet 1 tablet  1 tablet Oral Daily Nolberto Hanlon, MD   1 tablet at 08/11/19 0828  . omega-3 acid ethyl esters (LOVAZA) capsule 1 g  1 g Oral Daily Agbata, Tochukwu, MD   1 g at 08/11/19 0829  . ondansetron (ZOFRAN) tablet 4 mg  4 mg Oral Q6H PRN Agbata, Tochukwu, MD       Or  . ondansetron (ZOFRAN) injection 4 mg  4 mg Intravenous Q6H PRN Agbata, Tochukwu, MD   4 mg at 08/10/19 0223  . protein supplement (ENSURE MAX) liquid  11 oz Oral Daily Amery, Sahar, MD      . sodium chloride flush (NS)  0.9 % injection 3 mL  3 mL Intravenous Q12H Agbata, Tochukwu, MD   3 mL at 08/11/19 0830  . sodium chloride flush (NS) 0.9 % injection  3 mL  3 mL Intravenous PRN Agbata, Tochukwu, MD          Allergies: No Known Allergies    Past Medical History: Past Medical History:  Diagnosis Date  . BPH (benign prostatic hypertrophy)   . CKD (chronic kidney disease) stage 3, GFR 30-59 ml/min   . Diabetes ([place])   . Diabetic peripheral neuropathy ([place])   . HBP (high blood pressure)   . Heart trouble   . Hyperlipidemia   . Hypertensive kidney disease   . MI (myocardial infarction) ([place]) 1991, 1996     Past Surgical History: Past Surgical History:  Procedure Laterality Date  . ANGIOPLASTY     x 2  . CARDIAC SURGERY    . CATARACT EXTRACTION, BILATERAL  06/2015  . CORONARY ARTERY BYPASS GRAFT  1991  . HERNIA REPAIR       Family History: Family History  Problem Relation Age of Onset  . Diabetes Mother   . Heart disease Mother   . Hypertension Mother   . Emphysema Father   . Lung cancer Father   . Breast cancer Sister   . Heart disease Maternal Aunt   . Stroke Neg Hx      Social History: Social History   Socioeconomic History  . Marital status: Married    Spouse name: Not on file  . Number of children: Not on file  . Years of education: Not on file  . Highest education level: 12th grade  Occupational History  . Occupation: Surveyor, mining    Comment: fulltime   Tobacco Use  . Smoking status: Former Smoker    Types: Cigarettes    Quit date: 04/07/1984    Years since quitting: 35.3  . Smokeless tobacco: Never Used  Substance and Sexual Activity  . Alcohol use: No  . Drug use: No  . Sexual activity: Yes  Other Topics Concern  . Not on file  Social History Narrative  . Not on file   Social Determinants of Health   Financial Resource Strain:   . Difficulty of Paying Living Expenses:   Food Insecurity:   . Worried About Charity fundraiser in the Last Year:   . Arts development officer in the Last Year:   Transportation Needs:   . Film/video editor (Medical):   [name] Lack of Transportation (Non-Medical):   Physical Activity:   . Days of Exercise per Week:   . Minutes of Exercise per Session:   Stress:   . Feeling of Stress :   Social Connections:   . Frequency of Communication with Friends and Family:   . Frequency of Social Gatherings with Friends and Family:   . Attends Religious Services:   . Active Member of Clubs or Organizations:   . Attends Archivist Meetings:   [name] Marital Status:   Intimate Partner Violence:   . Fear of Current or Ex-Partner:   . Emotionally Abused:   [name] Physically Abused:   . Sexually Abused:      Review of Systems: Gen: No fevers or chills HEENT: No vision or hearing complaints CV: No chest pain, some shortness of breath.  Severe CHF Resp: No cough or sputum production GI: Appetite is fair GU : Has a Foley catheter which has been leaking.  Denies any problems with voiding at home MS: Ambulates at home without any problem Derm:    No complaints Psych: No complaints Heme: No complaints Neuro: No complaints Endocrine.  No complaints  Vital Signs: Blood pressure 93/65, pulse (!) 102, temperature 98 F (36.7 C), temperature source Oral, resp. rate 19, height _0  (1.702 m), weight 65.3 kg, SpO2 97 %.   Intake/Output Summary (Last 24 hours) at 08/11/2019 1026 Last data filed at 08/10/2019 1847 Gross per 24 hour  Intake 291.34 ml  Output 80 ml  Net 211.34 ml    Weight trends: Filed Weights   08/08/19 0739 08/09/19 0401 08/09/19 2100  Weight: 59 kg 63.5 kg 65.3 kg    Physical Exam: General:  Laying in the bed, no acute distress  HEENT  anicteric, moist oral mucous membranes  Neck:  Supple  Lungs:  Normal breathing effort limited  Heart::  Tachycardic  Abdomen:  Soft, distended  Extremities:   + edema  Neurologic:  Alert, oriented  Skin:  No acute rashes  Access:   Foley:  In place        Lab results: Basic Metabolic Panel: Recent Labs  Lab 08/09/19 2029 08/09/19 2029 08/10/19 0411 08/10/19 1011 08/10/19 1219 08/10/19 1651 08/11/19 0427 08/11/19 0716  NA 124*  --  129*  --   --   --   --  125*  K 5.3*   < > 6.4*   < > 6.0* 5.8*  --  5.8*  CL 93*  --  95*  --   --   --   --  91*  CO2 19*  --  21*  --   --   --   --  23  GLUCOSE 235*  --  192*  --   --   --   --  249*  BUN 36*  --  40*  --   --   --   --  58*  CREATININE 1.12  --  1.33*  --   --   --   --  2.17*  CALCIUM 8.5*  --  8.8*  --   --   --   --  8.5*  MG 2.1  --   --   --   --   --  2.4  --    < > = values in this interval not displayed.    Liver Function Tests: Recent Labs  Lab 08/08/19 0757  AST 36  ALT 41  ALKPHOS 214*  BILITOT 1.1  PROT 6.6  ALBUMIN 3.7   No results for input(s): LIPASE, AMYLASE in the last 168 hours. No results for input(s): AMMONIA in the last 168 hours.  CBC: Recent Labs  Lab 08/10/19 0411 08/11/19 0427  WBC 12.5* 13.5*  NEUTROABS  --  11.5*  HGB 14.1 11.6*  HCT 41.5 34.5*  MCV 96.5 94.5  PLT 200 96*    Cardiac Enzymes: No results for input(s): CKTOTAL, TROPONINI in the last 168 hours.  BNP: Invalid input(s): POCBNP  CBG: Recent Labs  Lab 08/10/19 1113 08/10/19 1616 08/10/19 1646 08/10/19 2131 08/11/19 0726  GLUCAP 95 60* 59* 199* 223*    Microbiology: Recent Results (from the past 720 hour(s))  Respiratory Panel by RT PCR (Flu A&B, Covid) - Nasopharyngeal Swab     Status: None   Collection Time: 08/08/19  9:43 AM   Specimen: Nasopharyngeal Swab  Result Value Ref Range Status   SARS Coronavirus 2 by RT PCR NEGATIVE NEGATIVE Final    Comment: (NOTE) SARS-CoV-2 target nucleic acids are NOT DETECTED. The SARS-CoV-2 RNA is generally detectable in upper respiratoy specimens during the acute phase of infection. The lowest concentration of SARS-CoV-2  viral copies this assay can detect is 131 copies/mL. A negative result does not preclude  SARS-Cov-2 infection and should not be used as the sole basis for treatment or other patient management decisions. A negative result may occur with  improper specimen collection/handling, submission of specimen other than nasopharyngeal swab, presence of viral mutation(s) within the areas targeted by this assay, and inadequate number of viral copies (<131 copies/mL). A negative result must be combined with clinical observations, patient history, and epidemiological information. The expected result is Negative. Fact Sheet for Patients:  PinkCheek.be Fact Sheet for Healthcare Providers:  GravelBags.it This test is not yet ap proved or cleared by the [place] FDA and  has been authorized for detection and/or diagnosis of SARS-CoV-2 by FDA under an Emergency Use Authorization (EUA). This EUA will remain  in effect (meaning this test can be used) for the duration of the COVID-19 declaration under Section 564(b)(1) of the Act, 21 U.S.C. section 360bbb-3(b)(1), unless the authorization is terminated or revoked sooner.    Influenza A by PCR NEGATIVE NEGATIVE Final   Influenza B by PCR NEGATIVE NEGATIVE Final    Comment: (NOTE) The Xpert Xpress SARS-CoV-2/FLU/RSV assay is intended as an aid in  the diagnosis of influenza from Nasopharyngeal swab specimens and  should not be used as a sole basis for treatment. Nasal washings and  aspirates are unacceptable for Xpert Xpress SARS-CoV-2/FLU/RSV  testing. Fact Sheet for Patients: PinkCheek.be Fact Sheet for Healthcare Providers: GravelBags.it This test is not yet approved or cleared by the [place] FDA and  has been authorized for detection and/or diagnosis of SARS-CoV-2 by  FDA under an Emergency Use Authorization (EUA). This EUA will remain  in effect (meaning this test can be used) for the duration of the  Covid-19  declaration under Section 564(b)(1) of the Act, 21  U.S.C. section 360bbb-3(b)(1), unless the authorization is  terminated or revoked. Performed at [facility], [address] [place], [place] [address]   MRSA PCR Screening     Status: None   Collection Time: 08/09/19  9:11 PM   Specimen: Nasal Mucosa; Nasopharyngeal  Result Value Ref Range Status   MRSA by PCR NEGATIVE NEGATIVE Final    Comment:        The GeneXpert MRSA Assay (FDA approved for NASAL specimens only), is one component of a comprehensive MRSA colonization surveillance program. It is not intended to diagnose MRSA infection nor to guide or monitor treatment for MRSA infections. Performed at [facility], [address] [place], [place] [address]      Coagulation Studies: No results for input(s): LABPROT, INR in the last 72 hours.  Urinalysis: Recent Labs    08/08/19 1126  COLORURINE YELLOW  LABSPEC 1.025  PHURINE 5.0  GLUCOSEU 100*  HGBUR NEGATIVE  BILIRUBINUR SMALL*  KETONESUR TRACE*  PROTEINUR 100*  NITRITE NEGATIVE  LEUKOCYTESUR SMALL*        Imaging: DG Chest 1 View  Result Date: 08/09/2019 CLINICAL DATA:  Shortness of breath. EXAM: CHEST  1 VIEW COMPARISON:  08/08/2019 FINDINGS: Slight worsening lung aeration likely pulmonary edema. Underlying severe chronic lung disease. No definite pleural effusions. IMPRESSION: Worsening lung aeration, likely pulmonary edema. Electronically Signed   By: Marijo Sanes M.D.   On: 08/09/2019 20:35      Assessment & Plan: Pt is a 74 y.o. Caucasian   male with  Ischemic cardiomyopathy CABG 1991, stents 2002 ICD 2013, 2021 Diabetes Hypertension Hyperlipidemia History of Covid infection October 2019  was admitted on 08/08/2019 with Hyponatremia [E87.1] Other fatigue [R53.83] Acute on chronic heart failure, unspecified heart failure type ([place]) [I50.9] Acute on chronic systolic (congestive) heart failure ([place]) [I50.23]     #AKI #Lower extremity edema #Hyperkalemia #Hyponatremia #Exacerbation of acute on chronic systolic CHF  Baseline creatinine of 0.89 from July 18, 2019 Since then, creatinine has been steadily increasing and is up to 2.17 today Patient hypotensive and has developed lower extremity edema  Plan is for initiation of iron tropic therapy with dobutamine once central line is available.  In the meantime, recommend midodrine to support blood pressure Once adequate diuresis can be achieved with better blood pressure, potassium and Sodium are expected to correct.  Would also recommend [place] until potassium level is in normal range - renal U/S to r/o obstruction  We will follow   LOS: 3 Elane Peabody 5/6/202110:26 AM    Note: This note was prepared with Dragon dictation. Any transcription errors are unintentional

## 2019-08-11 NOTE — Consult Note (Signed)
Name: Matthew Wolf MRN: [id number] DOB: [birth year]     CONSULTATION DATE: 08/08/2019  REFERRING MD :  Paraschos  CHIEF COMPLAINT:  Severe cardiomyopathy   HISTORY OF PRESENT ILLNESS:   74 y.o.malewith medical history significant forcoronary artery disease with ischemic cardiomyopathy,history of COVID-19 infection about 24month ago, hypertension, diabetes mellitus and chronic kidney disease who presents to the emergency room for evaluation of fatigue, generalized weakness and poor oral intake. -He denies having any chest pain +shortness of breath   Patient is status post dual-chamber AICD insertion on04/19/21for ischemic cardiomyopathy as well as AV nodal reentry tachycardia. Patient had a 2D echocardiogramon 04/01/21which shows severely depressed LVdysfunction with LVEF of 15%.  Chest x-ray showschronic interstitial changes in the periphery of both lungs favor chronic interstitial lung disease. Enlargement of cardiac silhouette post CABG and ICD.   5/5: Patient was transferred to the intensive care unit on 08/09/2019 given concerns for increasing oxygen requirement, decreasing blood pressure, increased work of breathing.  Patient was followed closely by cardiology.   5/5: Furosemide changed to as needed given concerns for hypotension.  Home Entresto was held.  [facility] discussion with cardiology [facility] to minimal improvement, persistent hypotension, hyponatremia, severe ischemic cardiomyopathy, acute respiratory failure.  Possible electrophysiology involvement as well as patient has potential recurrent AVNRT.  Cardiology recs as follows: Continue to hold Entresto Continue metoprolol succinate 12.5 mg daily IV Lopressor as needed 2.5 to 5 mg for recurrent tachycardia Defer Lasix at this time, made as needed on discharge medication reconciliation Medical treatment for  hyperkalemia  Cardiology asking for CVL for aggressive medical; vasopressors therapy Patient with increased WOB, but awake and alert Now with progressive renal failure   PAST MEDICAL HISTORY :   has a past medical history of BPH (benign prostatic hypertrophy), CKD (chronic kidney disease) stage 3, GFR 30-59 ml/min, [place], Diabetic peripheral neuropathy ([place], HBP (high blood pressure), Heart trouble, Hyperlipidemia, Hypertensive kidney disease, and [place]  has a past surgical history that includes Cardiac surgery; Hernia repair; Coronary artery bypass graft (1991); Angioplasty; and Cataract extraction, bilateral (06/2015). Prior to Admission medications   Medication Sig Start Date End Date Taking? Authorizing Provider  albuterol (VENTOLIN HFA) 108 (90 Base) MCG/ACT inhaler TAKE 2 PUFFS BY MOUTH EVERY 6 HOURS AS NEEDED FOR WHEEZE OR SHORTNESS OF BREATH Patient taking differently: Inhale 2 puffs into the lungs every 6 (six) hours as needed for wheezing or shortness of breath.  07/01/19  Yes Cannady, Jolene T, NP  aspirin EC 81 MG tablet Take 81 mg by mouth daily.    Yes [provider]  atorvastatin (LIPITOR) 80 MG tablet Take 80 mg by mouth at bedtime.  03/20/13  Yes [provider]  empagliflozin (JARDIANCE) 25 MG TABS tablet Take 25 mg by mouth daily before breakfast. 04/27/19  Yes Cannady, Jolene T, NP  furosemide (LASIX) 20 MG tablet Take 20 mg by mouth daily.    Yes [provider]  insulin degludec (TRESIBA) 100 UNIT/ML FlexTouch Pen Inject 0.1 mLs (10 Units total) into the skin daily. 08/03/19  Yes Cannady, Jolene T, NP  Insulin Pen Needle 32G X 4 MM MISC 1 kit by Does not apply route daily. 08/04/19  Yes Cannady, Jolene T, NP  magnesium oxide (MAG-OX) 400 MG tablet Take 400 mg by mouth daily.   Yes [provider]  metFORMIN (GLUCOPHAGE) 500 MG tablet Take 500  MG by mouth (one tablet) at noon and 1000 MG by  mouth (2 tablets) at 1800. Patient taking differently: Take 500-1,000 mg by mouth See admin instructions. Take 1 tablet (512m) by mouth at breakfast and take 2 tablets (10046m by mouth every evening at supper 06/01/19  Yes Cannady, Jolene T, NP  metoprolol succinate (TOPROL-XL) 50 MG 24 hr tablet Take 50 mg by mouth at bedtime.    Yes [provider]  naproxen sodium (ALEVE) 220 MG tablet Take 220 mg by mouth 2 (two) times daily as needed (pain).   Yes [provider]  nortriptyline (PAMELOR) 25 MG capsule TAKE 1 CAPSULE (25 MG TOTAL) BY MOUTH 2 (TWO) TIMES DAILY. 08/01/19  Yes Cannady, Jolene T, NP  Omega-3 Fatty Acids (FISH OIL PO) Take 1 capsule by mouth daily.    Yes [provider]  sacubitril-valsartan (ENTRESTO) 24-26 MG Take 1 tablet by mouth 2 (two) times daily.   Yes [provider]  SYMBICORT 160-4.5 MCG/ACT inhaler TAKE 2 PUFFS BY MOUTH TWICE A DAY Patient taking differently: Inhale 2 puffs into the lungs 2 (two) times daily.  06/24/19  Yes [place], AdAudie ClearNP  furosemide (LASIX) 20 MG tablet Take 1 tablet (20 mg total) by mouth daily as needed (shortness of breath, orthopnea, edema, weight gain of 2 or more pounds overnight). 08/10/19   SrSidney AceMD  metoprolol succinate (TOPROL-XL) 25 MG 24 hr tablet Take 0.5 tablets (12.5 mg total) by mouth daily. 08/11/19   SrSidney AceMD   No Known Allergies  FAMILY HISTORY:  family history includes Breast cancer in his sister; Diabetes in his mother; Emphysema in his father; Heart disease in his maternal aunt and mother; Hypertension in his mother; Lung cancer in his father. SOCIAL HISTORY:  reports that he quit smoking about 35 years ago. His smoking use included cigarettes. He has never used smokeless tobacco. He reports that he does not drink alcohol or use drugs.   Review of Systems:  Gen: + weight loss  +fatigue HEENT: Denies blurred vision, double vision, ear pain, eye pain, hearing  loss, nose bleeds, sore throat Cardiac:  No dizziness, chest pain or heaviness, chest tightness,edema, +JVD Resp:   +shortness of breath Gi: Denies swallowing difficulty, stomach pain, nausea or vomiting, diarrhea, constipation, bowel incontinence Gu:  Denies bladder incontinence, burning urine Ext:   Denies Joint pain, stiffness or swelling Skin: Denies  skin rash, easy bruising or bleeding or hives Endoc:  Denies polyuria, polydipsia , polyphagia or weight change Psych:   Denies depression, insomnia or hallucinations  Other:  All other systems negative   Pressure Injury 08/10/19 Heel Left Deep Tissue Pressure Injury - Purple or maroon localized area of discolored intact skin or blood-filled blister due to damage of underlying soft tissue from pressure and/or shear. (Active)  08/10/19 0800  Location: Heel  Location Orientation: Left  Staging: Deep Tissue Pressure Injury - Purple or maroon localized area of discolored intact skin or blood-filled blister due to damage of underlying soft tissue from pressure and/or shear.  Wound Description (Comments):   Present on Admission:      Estimated body mass index is 22.55 kg/m as calculated from the following:   Height as of this encounter: '5\' 7"'  (1.702 m).   Weight as of this encounter: 65.3 kg.    VITAL SIGNS: Temp:  [98 F (36.7 C)-99.7 F (37.6 C)] 98 F (36.7 C) (05/06 0400) Pulse Rate:  [73-102] 102 (05/06 0700) Resp:  [  14-27] 19 (05/06 0700) BP: (60-107)/(43-70) 93/65 (05/06 0700) SpO2:  [71 %-100 %] 97 % (05/06 0700)   I/O last 3 completed shifts: In: 648.3 [P.O.:594; I.V.:54.3] Out: 150 [Urine:150] No intake/output data recorded.   SpO2: 97 % O2 Flow Rate (L/min): 6 L/min   Physical Examination:  GENERAL:critically ill appearing, +mild resp distress HEAD: Normocephalic, atraumatic.  EYES: Pupils equal, round, reactive to light.  No scleral icterus.  MOUTH: Moist mucosal membrane. NECK: Supple. No JVD.   PULMONARY: +rhonchi, CARDIOVASCULAR: S1 and S2. Regular rate and rhythm. No murmurs, rubs, or gallops.  GASTROINTESTINAL: Soft, nontender, -distended.  Positive bowel sounds.  MUSCULOSKELETAL: + edema.  NEUROLOGIC: obtunded SKIN:intact,warm,dry  I personally reviewed lab work that was obtained in last 24 hrs. CXR Independently reviewed-b/l opacities, edema, effusions  MEDICATIONS: I have reviewed all medications and confirmed regimen as documented   CULTURE RESULTS   Recent Results (from the past 240 hour(s))  Respiratory Panel by RT PCR (Flu A&B, Covid) - Nasopharyngeal Swab     Status: None   Collection Time: 08/08/19  9:43 AM   Specimen: Nasopharyngeal Swab  Result Value Ref Range Status   SARS Coronavirus 2 by RT PCR NEGATIVE NEGATIVE Final    Comment: (NOTE) SARS-CoV-2 target nucleic acids are NOT DETECTED. The SARS-CoV-2 RNA is generally detectable in upper respiratoy specimens during the acute phase of infection. The lowest concentration of SARS-CoV-2 viral copies this assay can detect is 131 copies/mL. A negative result does not preclude SARS-Cov-2 infection and should not be used as the sole basis for treatment or other patient management decisions. A negative result may occur with  improper specimen collection/handling, submission of specimen other than nasopharyngeal swab, presence of viral mutation(s) within the areas targeted by this assay, and inadequate number of viral copies (<131 copies/mL). A negative result must be combined with clinical observations, patient history, and epidemiological information. The expected result is Negative. Fact Sheet for Patients:  PinkCheek.be Fact Sheet for Healthcare Providers:  GravelBags.it This test is not yet ap proved or cleared by the [place] FDA and  has been authorized for detection and/or diagnosis of SARS-CoV-2 by FDA under an Emergency Use Authorization  (EUA). This EUA will remain  in effect (meaning this test can be used) for the duration of the COVID-19 declaration under Section 564(b)(1) of the Act, 21 U.S.C. section 360bbb-3(b)(1), unless the authorization is terminated or revoked sooner.    Influenza A by PCR NEGATIVE NEGATIVE Final   Influenza B by PCR NEGATIVE NEGATIVE Final    Comment: (NOTE) The Xpert Xpress SARS-CoV-2/FLU/RSV assay is intended as an aid in  the diagnosis of influenza from Nasopharyngeal swab specimens and  should not be used as a sole basis for treatment. Nasal washings and  aspirates are unacceptable for Xpert Xpress SARS-CoV-2/FLU/RSV  testing. Fact Sheet for Patients: PinkCheek.be Fact Sheet for Healthcare Providers: GravelBags.it This test is not yet approved or cleared by the [place] FDA and  has been authorized for detection and/or diagnosis of SARS-CoV-2 by  FDA under an Emergency Use Authorization (EUA). This EUA will remain  in effect (meaning this test can be used) for the duration of the  Covid-19 declaration under Section 564(b)(1) of the Act, 21  U.S.C. section 360bbb-3(b)(1), unless the authorization is  terminated or revoked. Performed at [facility], [address], [place], [place] [address]   MRSA PCR Screening     Status: None   Collection Time: 08/09/19  9:11 PM  Specimen: Nasal Mucosa; Nasopharyngeal  Result Value Ref Range Status   MRSA by PCR NEGATIVE NEGATIVE Final    Comment:        The GeneXpert MRSA Assay (FDA approved for NASAL specimens only), is one component of a comprehensive MRSA colonization surveillance program. It is not intended to diagnose MRSA infection nor to guide or monitor treatment for MRSA infections. Performed at [facility], [address], [place], [place] [address]           IMAGING    No results found.   Nutrition Status: Nutrition Problem: Severe  Malnutrition Etiology: chronic illness(CHF, DM) Signs/Symptoms: severe fat depletion, moderate fat depletion, moderate muscle depletion, severe muscle depletion Interventions: Ensure Enlive (each supplement provides 350kcal and 20 grams of protein), Glucerna shake, MVI       ASSESSMENT AND PLAN SYNOPSIS Severe ischemic cardiomyopathy with progressive renal failure Cardio-renal syndrome, complicated by hyponatremia and hypotension Acute on chronic systolic heart failure  Prognosis is grave and very poor Plan for CVL placement and aggressive vasopressors therapy as per cardiology  ACUTE SYSTOLIC CARDIAC FAILURE- EF 15% -oxygen as needed -Lasix as tolerated -follow up cardiology recs    ACUTE KIDNEY INJURY/Renal Failure -follow chem 7 -follow UO -continue Foley Catheter-assess need -Avoid nephrotoxic agents    SHOCK-CARDIOGENIC -use vasopressors to keep MAP>65 -follow ABG and LA   CARDIAC ICU monitoring   GI GI PROPHYLAXIS as indicated  NUTRITIONAL STATUS DIET--> as tolerated Constipation protocol as indicated   ENDO - will use ICU hypoglycemic\Hyperglycemia protocol if needed    ELECTROLYTES -follow labs as needed -replace as needed -pharmacy consultation and following   DVT/GI PRX ordered TRANSFUSIONS AS NEEDED MONITOR FSBS ASSESS the need for LABS    Critical Care Time devoted to patient care services described in this note is 55  minutes.   Overall, patient is critically ill, prognosis is guarded.  Patient with Multiorgan failure and at high risk for cardiac arrest and death.     Corrin Parker, M.D.  [facility]  Medical Director [place] Director [facility] Cardio-Pulmonary Department

## 2019-08-11 NOTE — Progress Notes (Signed)
Central Venous Catheter Placement:TRIPLE LUMEN Indication: Patient receiving vesicant or irritant drug.; Patient receiving intravenous therapy for longer than 5 days.; Patient has limited or no vascular access.   Consent:emergent    Hand washing performed prior to starting the procedure.   Procedure:   An active timeout was performed and correct patient, name, & ID confirmed.   Patient was positioned correctly for central venous access.  Patient was prepped using strict sterile technique including chlorohexadine preps, sterile drape, sterile gown and sterile gloves.    The area was prepped, draped and anesthetized in the usual sterile manner. Patient comfort was obtained.    A triple lumen catheter was placed in LEFT Internal Jugular Vein There was good blood return, catheter caps were placed on lumens, catheter flushed easily, the line was secured and a sterile dressing and BIO-PATCH applied.   Ultrasound was used to visualize vasculature and guidance of needle.   Number of Attempts: 1 Complications:none Estimated Blood Loss: none Chest Radiograph indicated and ordered.  Operator: Laquita Harlan.   Adyline Huberty David Danilynn Jemison, M.D.  [facility]  Medical Director ICU-[facility] [place] Medical Director [facility] Cardio-Pulmonary Department     

## 2019-08-11 NOTE — Consult Note (Signed)
Consultation Note Date: 08/11/2019   Patient Name: Matthew Wolf  DOB: 03/10/1946  MRN: [id number]  Age / Sex: 74 y.o., male   PCP: [name], NP Referring Physician: Sidney Ace, MD   REASON FOR CONSULTATION:Establishing goals of care  Palliative Care consult requested for goals of care discussion in this 74 y.o. male with multiple medical problems including coronary artery disease with ischemic cardiomyopathy s/p AICD (07/2019) EF 20% at that time, history of COVID-19 infection about 6 months ago, hypertension, diabetes mellitus, BPH, and chronic kidney disease. He presented to ED from home with complaints of generalized weakness, fatigue, and poor oral intake. Chest x-ray showed chronic interstitial changes in both lungs. Patient received IV fluid bolus in the setting of hypotension (systolic in the 02R). Since admission patient with increased oxygen requirement, continued hypotension, and worsening renal failure. Cardiology following with plans for transfer to [facility] for advanced heart failure care due to minimal improvement. Plans for inotrope therapy. Nephrology also following for diuresis.   Clinical Assessment and Goals of Care: I have reviewed medical records including lab results, imaging, Epic notes, and MAR, received report from the bedside RN, and assessed the patient. I met at the bedside with patient, his wife, Matthew Wolf, and son to discuss diagnosis prognosis, GOC, EOL wishes, disposition and options.  Matthew Wolf is extremely weak. He is awake, A&O x3. Holding son's hand and expressing his love and appreciation for him and his support throughout his life. No acute distress or pain.   I introduced Palliative Medicine as specialized medical care for people living with serious illness. It focuses on providing relief from the symptoms and stress of a serious illness. The goal is to improve quality of life for both the patient and the family. Patient and family  verbalized understanding.   We discussed a brief life review of the patient, along with his functional and nutritional status. Matthew Wolf reports he and his wife have been married for more than 45 years. They have 5 children. Patient and wife resided in the home alone with their dog. He is a retired Paramedic. Is of Christian faith and speaks on his prayers and God's will.   We discussed His current illness and what it means in the larger context of His on-going co-morbidities. Natural disease trajectory and expectations at EOL were discussed.   Matthew Wolf and family verbalized understanding of his current illness. He reports the goal is to attempt current treatment and medications as recommended with hopes of improvement/stability. He speaks of his faith in God and God's will. Therapeutic listening and support given.   I attempted to elicit values and goals of care important to the patient.    Matthew Wolf expressed wishes is to continue to treat the treatable again with hope and faith for improvement/stability. He remains hopeful but is also prepared for the worst (no improvement/end-of-life). He shares he would like to take it each day and moment at a time, get updates from the medical team on how interventions are going and how he is feeling and responding to make the best decisions at that point.   DNR confirmed. Patient understandings regarding worsening renal failure and is hopeful that treatment will warrant some improvement or stability. He is not certain he would want to initiate dialysis and this will depend on medical recommendations and how he is feeling/quality of life. He chooses to address mor specifically when the time comes that a firm  decision regarding HD has to be made.   Questions and concerns were addressed.  The family was encouraged to call with questions or concerns.  PMT will continue to support holistically.   SOCIAL HISTORY:     reports that he quit  smoking about 35 years ago. His smoking use included cigarettes. He has never used smokeless tobacco. He reports that he does not drink alcohol or use drugs.  CODE STATUS: DNR  ADVANCE DIRECTIVES: patient and wife    SYMPTOM MANAGEMENT: per attending   Palliative Prophylaxis:   Aspiration, Bowel Regimen, Delirium Protocol, Frequent Pain Assessment, Oral Care and Turn Reposition  PSYCHO-SOCIAL/SPIRITUAL:  Support System: Family  Desire for further Chaplaincy support: Yes   Additional Recommendations (Limitations, Scope, Preferences):  Treat the treatable/DNR   PAST MEDICAL HISTORY: Past Medical History:  Diagnosis Date  . BPH (benign prostatic hypertrophy)   . CKD (chronic kidney disease) stage 3, GFR 30-59 ml/min   . Diabetes ([place])   . Diabetic peripheral neuropathy ([place])   . HBP (high blood pressure)   . Heart trouble   . Hyperlipidemia   . Hypertensive kidney disease   . MI (myocardial infarction) ([place]) 1991, 1996    PAST SURGICAL HISTORY:  Past Surgical History:  Procedure Laterality Date  . ANGIOPLASTY     x 2  . CARDIAC SURGERY    . CATARACT EXTRACTION, BILATERAL  06/2015  . CORONARY ARTERY BYPASS GRAFT  1991  . HERNIA REPAIR      ALLERGIES:  has No Known Allergies.   MEDICATIONS:  Current Facility-Administered Medications  Medication Dose Route Frequency Provider Last Rate Last Admin  . 0.9 %  sodium chloride infusion  250 mL Intravenous PRN Agbata, Tochukwu, MD      . acetaminophen (TYLENOL) tablet 650 mg  650 mg Oral Q6H PRN Agbata, Tochukwu, MD       Or  . acetaminophen (TYLENOL) suppository 650 mg  650 mg Rectal Q6H PRN Agbata, Tochukwu, MD      . aspirin EC tablet 81 mg  81 mg Oral Daily Agbata, Tochukwu, MD   81 mg at 08/11/19 0828  . atorvastatin (LIPITOR) tablet 80 mg  80 mg Oral QHS Agbata, Tochukwu, MD   80 mg at 08/10/19 2123  . Chlorhexidine Gluconate Cloth 2 % PADS 6 each  6 each Topical Daily Nolberto Hanlon, MD   6 each at 08/11/19 [phone]    . dextrose 5 %-0.9 % sodium chloride infusion   Intravenous Continuous Ralene Muskrat B, MD 50 mL/hr at 08/10/19 1800 Rate Verify at 08/10/19 1800  . feeding supplement (GLUCERNA SHAKE) (GLUCERNA SHAKE) liquid 237 mL  237 mL Oral BID BM Nolberto Hanlon, MD   237 mL at 08/11/19 0829  . furosemide (LASIX) tablet 20 mg  20 mg Oral Daily PRN Clabe Seal, PA-C   Stopped at 08/09/19 1955  . insulin aspart (novoLOG) injection 0-15 Units  0-15 Units Subcutaneous TID WC Agbata, Tochukwu, MD   5 Units at 08/11/19 0828  . levalbuterol (XOPENEX) nebulizer solution 0.63 mg  0.63 mg Nebulization Q8H PRN Lang Snow, NP      . magnesium oxide (MAG-OX) tablet 400 mg  400 mg Oral Daily Agbata, Tochukwu, MD   400 mg at 08/11/19 0828  . metoprolol succinate (TOPROL-XL) 24 hr tablet 12.5 mg  12.5 mg Oral Daily Clabe Seal, PA-C   12.5 mg at 08/11/19 1478  . midodrine (PROAMATINE) tablet 10 mg  10 mg Oral TID WC Sreenath, Sudheer B,  MD      . mometasone-formoterol (DULERA) 200-5 MCG/ACT inhaler 2 puff  2 puff Inhalation BID Agbata, Tochukwu, MD   2 puff at 08/11/19 0830  . multivitamin with minerals tablet 1 tablet  1 tablet Oral Daily Nolberto Hanlon, MD   1 tablet at 08/11/19 0828  . omega-3 acid ethyl esters (LOVAZA) capsule 1 g  1 g Oral Daily Agbata, Tochukwu, MD   1 g at 08/11/19 0829  . ondansetron (ZOFRAN) tablet 4 mg  4 mg Oral Q6H PRN Agbata, Tochukwu, MD       Or  . ondansetron (ZOFRAN) injection 4 mg  4 mg Intravenous Q6H PRN Agbata, Tochukwu, MD   4 mg at 08/10/19 0223  . protein supplement (ENSURE MAX) liquid  11 oz Oral Daily Amery, Sahar, MD      . sodium chloride flush (NS) 0.9 % injection 3 mL  3 mL Intravenous Q12H Agbata, Tochukwu, MD   3 mL at 08/11/19 0830  . sodium chloride flush (NS) 0.9 % injection 3 mL  3 mL Intravenous PRN Agbata, Tochukwu, MD        VITAL SIGNS: BP 93/65   Pulse (!) 102   Temp 98 F (36.7 C) (Oral)   Resp 19   Ht _0  (1.702 m)   Wt 65.3 kg   SpO2 97%    BMI 22.55 kg/m  Filed Weights   08/08/19 0739 08/09/19 0401 08/09/19 2100  Weight: 59 kg 63.5 kg 65.3 kg    Estimated body mass index is 22.55 kg/m as calculated from the following:   Height as of this encounter: _1  (1.702 m).   Weight as of this encounter: 65.3 kg.  LABS: CBC:    Component Value Date/Time   WBC 13.5 (H) 08/11/2019 0427   HGB 11.6 (L) 08/11/2019 0427   HGB 14.4 04/19/2019 0910   HCT 34.5 (L) 08/11/2019 0427   HCT 42.3 04/19/2019 0910   PLT 96 (L) 08/11/2019 0427   PLT 350 04/19/2019 0910   Comprehensive Metabolic Panel:    Component Value Date/Time   NA 125 (L) 08/11/2019 0716   NA 132 (L) 08/03/2019 1627   K 5.8 (H) 08/11/2019 0716   CO2 23 08/11/2019 0716   BUN 58 (H) 08/11/2019 0716   BUN 20 08/03/2019 1627   CREATININE 2.17 (H) 08/11/2019 0716   ALBUMIN 3.7 08/08/2019 0757   ALBUMIN 4.7 06/01/2019 1101     Review of Systems Unless otherwise noted, a complete review of systems is negative.  Physical Exam General: NAD, frail ill-appearing, thin Cardiovascular: tachycardia  Neurological: awake, alert, mood appropriate, follows commands    Prognosis: Poor   Discharge Planning:  To Be Determined Possible transfer to [facility] for continued cardiology care.  Recommendations:  DNR/DNI  Continue current plan of care per medical team  Patient and family hopeful for improvement/stability with treatments, however also preparing for no improvement/end-of-life. Patient does not wish to suffer, however wishes to have an opportunity to see if he can stabilize. Speaks on strong faith and God's will. He is at peace with "what will be".   PMT will continue to support and follow.    Palliative Performance Scale: PPS 20%              Patient and family expressed understanding and was in agreement with this plan.   Thank you for allowing the Palliative Medicine Team to assist in the care of this patient.  Time In: 1205 Time Out: 1305  Time Total:  65 min.   Visit consisted of counseling and education dealing with the complex and emotionally intense issues of symptom management and palliative care in the setting of serious and potentially life-threatening illness.Greater than 50%  of this time was spent counseling and coordinating care related to the above assessment and plan.  Signed by:  Alda Lea, AGPCNP-BC Palliative Medicine Team  Phone: [phone] Fax: [phone] Pager: [phone] Amion: Bjorn Pippin

## 2019-08-11 NOTE — Consult Note (Signed)
Brief note. Full consult to follow 1. ARF 2. Hypotension 3. Hyperkalemia  Plan:  D/c iv NS Renal US [place] for hyperkalemia ionotrope therapy

## 2019-08-11 NOTE — Progress Notes (Signed)
PROGRESS NOTE    Matthew Wolf  GHW:[id number] DOB: 1945-10-30 DOA: 08/08/2019 PCP: [name], NP    Brief Narrative:  VEL:FYBOF Matthew Morganis a 74 y.o.malewith medical history significant forcoronary artery disease with ischemic cardiomyopathy,history of COVID-19 infection about 42months ago, hypertension, diabetes mellitus and chronic kidney disease who presents to the emergency room for evaluation of fatigue, generalized weakness and poor oral intake.He denies having any chest pain, no shortness of breath when compared to his baseline, no nausea, no vomiting, no diaphoresis or palpitation. Patient is status post dual-chamber AICD insertion on04/19/21for ischemic cardiomyopathy as well as AV nodal reentry tachycardia. Patient had a 2D echocardiogramon 04/01/21which shows severely depressed LVdysfunction with LVEF of 20%. Chest x-ray showschronic interstitial changes in the periphery of both lungs favor chronic interstitial lung disease. Enlargement of cardiac silhouette post CABG and ICD. Twelve-lead EKG shows sinus rhythm with PVCs. Labs reveal a serum sodium of 128, serum creatinine(baseline 0.8)and elevated BNP > 3,000  5/5: Patient was transferred to the intensive care unit on 08/09/2019 given concerns for increasing oxygen requirement, decreasing blood pressure, increased work of breathing.  Patient was followed closely by cardiology.  Patient was evaluated by myself and cardiology this morning.  1 dose of metoprolol given per cardiology recommendations.  Blood pressure decreased however resolved spontaneously.  Furosemide changed to as needed given concerns for hypotension.  Home Entresto was held.  After discussion with cardiology will arrange for transfer to [facility] for advanced heart failure care due to minimal improvement, persistent hypotension, hyponatremia, severe ischemic cardiomyopathy, acute respiratory failure.  Possible electrophysiology  involvement as well as patient has potential recurrent AVNRT.  Cardiology recs as follows: Continue to hold Entresto Continue metoprolol succinate 12.5 mg daily IV Lopressor as needed 2.5 to 5 mg for recurrent tachycardia Defer Lasix at this time, made as needed on discharge medication reconciliation Medical treatment for hyperkalemia  5/6: Continue to be pending transfer to [facility].  Pending bed availability.  Renal function worsened this morning.  Worsening hyponatremia, persistent hyperkalemia.  Consulted and discussed case with nephrology.  Per cardiology ICU consulted for central line placement and initiation of inotrope therapy.  Patient communicating and alert this morning.  Expressing frustration about current situation.   Assessment & Plan:   Principal Problem:   HFrEF (heart failure with reduced ejection fraction) (HCC) Active Problems:   Coronary artery disease   Type 2 diabetes mellitus with cardiac complication (HCC)   Interstitial pulmonary disease (HCC)   ICD (implantable cardioverter-defibrillator) in place   Acute on chronic systolic (congestive) heart failure (HCC)   Hyponatremia   Dehydration   Protein-calorie malnutrition, severe  Severe ischemic cardiomyopathy Acute on chronic systolic heart failure Hyponatremia Hypotension Acute renal failure Suspect that worsening of patient's underlying cardiomyopathy is driving factor to acute renal failure, hyponatremia, hypotension.  Central line placed per ICU today.  Cardiac inotropes to be initiated.  Consideration for furosemide infusion after inotrope support.  Pending bed availability at [facility].  If patient is to remain in house today care to be assumed by ICU.  Appreciate assistance from cardiology, nephrology and PCCM.  Coronary artery disease On aspirin, statin, beta-blocker Status post AICD insertion  Diabetes mellitus type 2 with concomitant stage III chronic kidney disease Creatinine  at baseline Sliding scale insulin coverage Avoid long-acting given poor p.o. intake  Chronic interstitial lung disease On inhaled home corticosteroids As needed bronchodilators  DVT prophylaxis: Heparin subcu Code Status: DNR Family Communication: None today  Disposition Plan: Plan to transfer to [facility] for advanced heart failure services and potential EP involvement.  Awaiting bed availability at [facility].   Consultants:   ICU  Cardiology  Nephrology  Procedures:   Central line placement, 08/11/2019  Antimicrobials:   None   Subjective: Seen and examined Remains hypotensive Extremely fatigued appearing Frustrated about clinical situation  Objective: Vitals:   08/11/19 1000 08/11/19 1100 08/11/19 1200 08/11/19 1300  BP: 96/73 94/61 92/71  95/73  Pulse:   (!) 104 (!) 106  Resp: 16 (!) 23 (!) 22 20  Temp:      TempSrc:      SpO2:   100% (!) 88%  Weight:      Height:        Intake/Output Summary (Last 24 hours) at 08/11/2019 1452 Last data filed at 08/10/2019 1847 Gross per 24 hour  Intake 54.34 ml  Output 80 ml  Net -25.66 ml   Filed Weights   08/08/19 0739 08/09/19 0401 08/09/19 2100  Weight: 59 kg 63.5 kg 65.3 kg    Examination:  General exam: Appears weak and fatigued.  Chronically ill. Respiratory system: Diffuse crackles bilaterally.  Increased work of breathing Cardiovascular system: Tachycardic, regular rhythm  gastrointestinal system: Scaphoid, nondistended, nontender Central nervous system: Alert and oriented. No focal neurological deficits. Extremities: Decreased power bilaterally Skin: Pale, no obvious rashes or lesions Psychiatry: Judgement and insight appear normal.  Depressed mood    Data Reviewed: I have personally reviewed following labs and imaging studies  CBC: Recent Labs  Lab 08/08/19 0757 08/09/19 0417 08/10/19 0411 08/11/19 0427  WBC 9.6 9.9 12.5* 13.5*  NEUTROABS  --   --   --  11.5*  HGB 13.0 13.0 14.1 11.6*  HCT 39.8  38.6* 41.5 34.5*  MCV 97.8 95.8 96.5 94.5  PLT 231 219 200 96*   Basic Metabolic Panel: Recent Labs  Lab 08/08/19 0757 08/08/19 0757 08/09/19 0417 08/09/19 0417 08/09/19 2029 08/09/19 2029 08/10/19 0411 08/10/19 1011 08/10/19 1219 08/10/19 1651 08/11/19 0427 08/11/19 0716  NA 128*  --  128*  --  124*  --  129*  --   --   --   --  125*  K 4.9   < > 5.1   < > 5.3*   < > 6.4* 6.2* 6.0* 5.8*  --  5.8*  CL 93*  --  95*  --  93*  --  95*  --   --   --   --  91*  CO2 25  --  24  --  19*  --  21*  --   --   --   --  23  GLUCOSE 157*  --  203*  --  235*  --  192*  --   --   --   --  249*  BUN 32*  --  35*  --  36*  --  40*  --   --   --   --  58*  CREATININE 1.25*  --  1.04  --  1.12  --  1.33*  --   --   --   --  2.17*  CALCIUM 8.8*  --  8.5*  --  8.5*  --  8.8*  --   --   --   --  8.5*  MG  --   --   --   --  2.1  --   --   --   --   --  2.4  --    < > =  values in this interval not displayed.   GFR: Estimated Creatinine Clearance: 26.7 mL/min (A) (by Matthew-G formula based on SCr of 2.17 mg/dL (H)). Liver Function Tests: Recent Labs  Lab 08/08/19 0757  AST 36  ALT 41  ALKPHOS 214*  BILITOT 1.1  PROT 6.6  ALBUMIN 3.7   No results for input(s): LIPASE, AMYLASE in the last 168 hours. No results for input(s): AMMONIA in the last 168 hours. Coagulation Profile: No results for input(s): INR, PROTIME in the last 168 hours. Cardiac Enzymes: No results for input(s): CKTOTAL, CKMB, CKMBINDEX, TROPONINI in the last 168 hours. BNP (last 3 results) No results for input(s): PROBNP in the last 8760 hours. HbA1C: No results for input(s): HGBA1C in the last 72 hours. CBG: Recent Labs  Lab 08/10/19 1616 08/10/19 1646 08/10/19 2131 08/11/19 0726 08/11/19 1142  GLUCAP 60* 59* 199* 223* 272*   Lipid Profile: No results for input(s): CHOL, HDL, LDLCALC, TRIG, CHOLHDL, LDLDIRECT in the last 72 hours. Thyroid Function Tests: No results for input(s): TSH, T4TOTAL, FREET4, T3FREE,  THYROIDAB in the last 72 hours. Anemia Panel: No results for input(s): VITAMINB12, FOLATE, FERRITIN, TIBC, IRON, RETICCTPCT in the last 72 hours. Sepsis Labs: No results for input(s): PROCALCITON, LATICACIDVEN in the last 168 hours.  Recent Results (from the past 240 hour(s))  Respiratory Panel by RT PCR (Flu A&B, Covid) - Nasopharyngeal Swab     Status: None   Collection Time: 08/08/19  9:43 AM   Specimen: Nasopharyngeal Swab  Result Value Ref Range Status   SARS Coronavirus 2 by RT PCR NEGATIVE NEGATIVE Final    Comment: (NOTE) SARS-CoV-2 target nucleic acids are NOT DETECTED. The SARS-CoV-2 RNA is generally detectable in upper respiratoy specimens during the acute phase of infection. The lowest concentration of SARS-CoV-2 viral copies this assay can detect is 131 copies/mL. A negative result does not preclude SARS-Cov-2 infection and should not be used as the sole basis for treatment or other patient management decisions. A negative result may occur with  improper specimen collection/handling, submission of specimen other than nasopharyngeal swab, presence of viral mutation(s) within the areas targeted by this assay, and inadequate number of viral copies (<131 copies/mL). A negative result must be combined with clinical observations, patient history, and epidemiological information. The expected result is Negative. Fact Sheet for Patients:  https://www.moore.com/ Fact Sheet for Healthcare Providers:  https://www.young.biz/ This test is not yet ap proved or cleared by the [place] FDA and  has been authorized for detection and/or diagnosis of SARS-CoV-2 by FDA under an Emergency Use Authorization (EUA). This EUA will remain  in effect (meaning this test can be used) for the duration of the COVID-19 declaration under Section 564(b)(1) of the Act, 21 U.S.Matthew. section 360bbb-3(b)(1), unless the authorization is terminated or revoked  sooner.    Influenza A by PCR NEGATIVE NEGATIVE Final   Influenza B by PCR NEGATIVE NEGATIVE Final    Comment: (NOTE) The Xpert Xpress SARS-CoV-2/FLU/RSV assay is intended as an aid in  the diagnosis of influenza from Nasopharyngeal swab specimens and  should not be used as a sole basis for treatment. Nasal washings and  aspirates are unacceptable for Xpert Xpress SARS-CoV-2/FLU/RSV  testing. Fact Sheet for Patients: https://www.moore.com/ Fact Sheet for Healthcare Providers: https://www.young.biz/ This test is not yet approved or cleared by the [place] FDA and  has been authorized for detection and/or diagnosis of SARS-CoV-2 by  FDA under an Emergency Use Authorization (EUA). This EUA will remain  in effect (meaning  this test can be used) for the duration of the  Covid-19 declaration under Section 564(b)(1) of the Act, 21  U.S.Matthew. section 360bbb-3(b)(1), unless the authorization is  terminated or revoked. Performed at [facility], [address] [place], [place] [address]   MRSA PCR Screening     Status: None   Collection Time: 08/09/19  9:11 PM   Specimen: Nasal Mucosa; Nasopharyngeal  Result Value Ref Range Status   MRSA by PCR NEGATIVE NEGATIVE Final    Comment:        The GeneXpert MRSA Assay (FDA approved for NASAL specimens only), is one component of a comprehensive MRSA colonization surveillance program. It is not intended to diagnose MRSA infection nor to guide or monitor treatment for MRSA infections. Performed at [facility], [address] [place], [place] [address]          Radiology Studies: DG Chest 1 View  Result Date: 08/09/2019 CLINICAL DATA:  Shortness of breath. EXAM: CHEST  1 VIEW COMPARISON:  08/08/2019 FINDINGS: Slight worsening lung aeration likely pulmonary edema. Underlying severe chronic lung disease. No definite pleural effusions. IMPRESSION: Worsening lung aeration, likely  pulmonary edema. Electronically Signed   By: Rudie MeyerP.  Gallerani M.D.   On: 08/09/2019 20:35   US RENAL  Result Date: 08/11/2019 CLINICAL DATA:  Acute renal failure. EXAM: RENAL / URINARY TRACT ULTRASOUND COMPLETE COMPARISON:  July 26, 2013. FINDINGS: Right Kidney: Renal measurements: 11.0 x 5.3 x 4.8 cm = volume: 148 mL . Echogenicity within normal limits. No mass or hydronephrosis visualized. Left Kidney: Renal measurements: 12.2 x 6.2 x 4.5 cm = volume: 177 mL. Echogenicity within normal limits. No mass or hydronephrosis visualized. Bladder: Appears normal for degree of bladder distention. Other: Mild ascites is noted. IMPRESSION: No significant renal abnormality is noted.  Mild ascites is noted. Electronically Signed   By: Lupita RaiderJames  Green Jr M.D.   On: 08/11/2019 10:47   DG Chest Port 1 View  Result Date: 08/11/2019 CLINICAL DATA:  Central catheter placement EXAM: PORTABLE CHEST 1 VIEW COMPARISON:  Aug 09, 2019 FINDINGS: Central catheter tip is in the superior vena cava. No pneumothorax. There is underlying fibrotic type change. There is no appreciable edema or airspace opacity currently. Heart is mildly enlarged with pulmonary vascularity normal. Pacemaker leads are attached to the right atrium and right ventricle. Patient is status post coronary artery bypass grafting. No adenopathy evident. There is aortic atherosclerosis. No bone lesions. IMPRESSION: Underlying fibrotic type change. No frank edema or airspace opacity. Stable cardiac silhouette. Pacemaker leads attached to right atrium and right ventricle. Status post coronary artery bypass grafting. Aortic Atherosclerosis (ICD10-I70.0). Electronically Signed   By: Bretta BangWilliam  Woodruff III M.D.   On: 08/11/2019 11:32        Scheduled Meds: . aspirin EC  81 mg Oral Daily  . atorvastatin  80 mg Oral QHS  . Chlorhexidine Gluconate Cloth  6 each Topical Daily  . feeding supplement (GLUCERNA SHAKE)  237 mL Oral BID BM  . insulin aspart  0-15 Units  Subcutaneous TID WC  . magnesium oxide  400 mg Oral Daily  . metoprolol succinate  12.5 mg Oral Daily  . midodrine  10 mg Oral TID WC  . mometasone-formoterol  2 puff Inhalation BID  . multivitamin with minerals  1 tablet Oral Daily  . omega-3 acid ethyl esters  1 g Oral Daily  . patiromer  16.8 g Oral Daily  . Ensure Max Protein  11 oz Oral Daily  .  sodium chloride flush  3 mL Intravenous Q12H   Continuous Infusions: . sodium chloride    . DOBUTamine 5 mcg/kg/min (08/11/19 1344)  . DOPamine 2.5 mcg/kg/min (08/11/19 1343)     LOS: 3 days    Time spent: 35 minutes    Tresa Moore, MD [facility] Pager [phone]  If 7PM-7AM, please contact night-coverage 08/11/2019, 2:52 PM

## 2019-08-11 NOTE — Progress Notes (Signed)
Ch visited with Pt as per recommendation by Ch.Waters for a follow-up. Ch had visited Pt and wife the previous day at 02:50 pm along with Ch.Waters as well. Upon entering the room, physician and RN were in the room, and physician gave Pt update about Pt's condition. Ch spoke with Pt, and Pt expressed anxiousness and concern. Pt said that he has to accept the natural progression and consequence. Pt expressed not wanting to linger on too long. Ch encouraged Pt, and reminded Pt about the care and love of his family. Pt said that he was concerned about wife after he leaves the earth, saying "I'll miss her."   11:13 a.m. - Ch was paged to the ED for a Code Stroke.  11:16 a.m. -  X-ray team came in to do [name]'s X-ray in the room. Ch let Pt know that she will be back to pray with Pt.  11:21a.m. - Ch spoke with Pt again, and Pt requested prayer. Ch prayed with Pt for strength, peace and comfort of pt as well as family. Pt was tearful during prayer. Ch let Pt know that he could ask for Ch at any time for support; let the nurse know to page chaplain. Pt was grateful for visit.

## 2019-08-11 NOTE — Patient Outreach (Signed)
[facility]) Care Management  08/11/2019  Matthew Wolf January 08, 1946 [id number]  Received both patient and provider portion(s) of patient assistance application(s) for Symb[employer]. Faxed completed application and required documents into [employer].  Will follow up with company(ies) in 7-14 business days to check status of application(s).  Orean Giarratano P. Domonic Kimball, CPhT [facility] [employer]  [phone]

## 2019-08-11 NOTE — Progress Notes (Signed)
[facility] Cardiology    SUBJECTIVE: The patient voices frustration at his current situation. He reports that his breathing is fair, denies chest pain or palpitations, and overall reports feeling poorly.   Vitals:   08/11/19 0400 08/11/19 0500 08/11/19 0600 08/11/19 0700  BP: (!) 89/68 90/64 (!) 85/63 93/65  Pulse: 100 100 98 (!) 102  Resp: (!) 25 19 (!) 21 19  Temp: 98 F (36.7 C)     TempSrc: Oral     SpO2: 100% 99% 97% 97%  Weight:      Height:         Intake/Output Summary (Last 24 hours) at 08/11/2019 7341 Last data filed at 08/10/2019 1847 Gross per 24 hour  Intake 411.34 ml  Output 80 ml  Net 331.34 ml      PHYSICAL EXAM  General: malnourished, ill appearing, lying in bed in mild distress HEENT:  Normocephalic and atramatic Neck:  No JVD.  Lungs: diffuse crackles appreciated on auscultation of anterior chest, tachypneic Heart: regular rhythm, tachycardic. Normal S1 and S2 without gallops or murmurs.  Abdomen: soft, distended Extremities: No clubbing, cyanosis or edema.   Neuro: Alert and oriented X 3. Psych:  Good affect, responds appropriately   LABS: Basic Metabolic Panel: Recent Labs    08/09/19 2029 08/09/19 2029 08/10/19 0411 08/10/19 1011 08/10/19 1651 08/11/19 0427 08/11/19 0716  NA 124*   < > 129*  --   --   --  125*  K 5.3*   < > 6.4*   < > 5.8*  --  5.8*  CL 93*   < > 95*  --   --   --  91*  CO2 19*   < > 21*  --   --   --  23  GLUCOSE 235*   < > 192*  --   --   --  249*  BUN 36*   < > 40*  --   --   --  58*  CREATININE 1.12   < > 1.33*  --   --   --  2.17*  CALCIUM 8.5*   < > 8.8*  --   --   --  8.5*  MG 2.1  --   --   --   --  2.4  --    < > = values in this interval not displayed.   Liver Function Tests: No results for input(s): AST, ALT, ALKPHOS, BILITOT, PROT, ALBUMIN in the last 72 hours. No results for input(s): LIPASE, AMYLASE in the last 72 hours. CBC: Recent Labs    08/10/19 0411 08/11/19 0427  WBC 12.5* 13.5*  NEUTROABS  --   11.5*  HGB 14.1 11.6*  HCT 41.5 34.5*  MCV 96.5 94.5  PLT 200 96*   Cardiac Enzymes: No results for input(s): CKTOTAL, CKMB, CKMBINDEX, TROPONINI in the last 72 hours. BNP: Invalid input(s): POCBNP D-Dimer: No results for input(s): DDIMER in the last 72 hours. Hemoglobin A1C: Recent Labs    08/08/19 1139  HGBA1C 8.8*   Fasting Lipid Panel: No results for input(s): CHOL, HDL, LDLCALC, TRIG, CHOLHDL, LDLDIRECT in the last 72 hours. Thyroid Function Tests: No results for input(s): TSH, T4TOTAL, T3FREE, THYROIDAB in the last 72 hours.  Invalid input(s): FREET3 Anemia Panel: No results for input(s): VITAMINB12, FOLATE, FERRITIN, TIBC, IRON, RETICCTPCT in the last 72 hours.  DG Chest 1 View  Result Date: 08/09/2019 CLINICAL DATA:  Shortness of breath. EXAM: CHEST  1 VIEW COMPARISON:  08/08/2019 FINDINGS: Slight  worsening lung aeration likely pulmonary edema. Underlying severe chronic lung disease. No definite pleural effusions. IMPRESSION: Worsening lung aeration, likely pulmonary edema. Electronically Signed   By: Marijo Sanes M.D.   On: 08/09/2019 20:35    TELEMETRY: Sinus tachycardia, 101 bpm  ASSESSMENT AND PLAN:  Principal Problem:   HFrEF (heart failure with reduced ejection fraction) (HCC) Active Problems:   Coronary artery disease   Type 2 diabetes mellitus with cardiac complication (HCC)   Interstitial pulmonary disease (HCC)   ICD (implantable cardioverter-defibrillator) in place   Acute on chronic systolic (congestive) heart failure (HCC)   Hyponatremia   Dehydration   Protein-calorie malnutrition, severe   1. Acute on chronic HFrEF, with LVEF less than 18%, complicated by acute renal failure, hyponatremia, and hypotension. Entresto on hold due to hypotension. Metoprolol continued due to AVNRT. 2.Ischemic cardiomyopathy, status post dual-chamber ICD on 07/25/2019.  3.Coronary artery disease,status post CABG in 1991, MI with VF arrest in 1996, and coronary  stents in 2002. The patient denies chest pain. ECG without evidence of ischemia. Troponin 37 and 35.  4.Interstitial lung disease,status post COVID, on home nocturnal and PRN oxygen; currently on 6L nasal cannula with oxygen saturations in the upper 90s. 5.Hyponatremia, sodium down to 125 today despite IV normal saline  6. Poor oral intake, contributing to dehydration, tachycardia, hyponatremia, and hypotension.  7. Hypotension, Entrestoon hold, low dose metoprolol succinate 8. 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 No recurrence. 9. Hyperkalemia, improved to 5.8 today 10. Acute renal failure, creatinine 1.33 yesterday, worsened to 2.17 today   Recommendations: 1. Consult Dr. Mortimer Fries for central line placement  2. Consult nephrology for acute renal failure 3. Likely start inotrope therapy following central line placement 4. Consider furosemide drip after starting inotrope therapy 5. Continue metoprolol succinate 12.5 mg daily for AVNRT  6. Await transfer to [facility] pending bed availability 7. Recommend repeating chest xray 8. PRN IV Lopressor 2.5 to 5 mg for recurrent AVNRT (narrow complex tachycardia)   Clabe Seal, PA-C 08/11/2019 9:27 AM  Plan made in collaboration with Dr. Saralyn Pilar and Dr. Tana Coast, admitting doctor at [facility].

## 2019-08-12 ENCOUNTER — Other Ambulatory Visit: Payer: Self-pay | Admitting: Pharmacy Technician

## 2019-08-12 DIAGNOSIS — E43 Unspecified severe protein-calorie malnutrition: Secondary | ICD-10-CM

## 2019-08-12 DIAGNOSIS — E871 Hypo-osmolality and hyponatremia: Secondary | ICD-10-CM

## 2019-08-12 LAB — BASIC METABOLIC PANEL
Anion gap: 8 (ref 5–15)
Anion gap: 9 (ref 5–15)
BUN: 72 mg/dL — ABNORMAL HIGH (ref 8–23)
BUN: 72 mg/dL — ABNORMAL HIGH (ref 8–23)
CO2: 24 mmol/L (ref 22–32)
CO2: 24 mmol/L (ref 22–32)
Calcium: 8.2 mg/dL — ABNORMAL LOW (ref 8.9–10.3)
Calcium: 8.2 mg/dL — ABNORMAL LOW (ref 8.9–10.3)
Chloride: 92 mmol/L — ABNORMAL LOW (ref 98–111)
Chloride: 90 mmol/L — ABNORMAL LOW (ref 98–111)
Creatinine, Ser: 2.2 mg/dL — ABNORMAL HIGH (ref 0.61–1.24)
Creatinine, Ser: 2.14 mg/dL — ABNORMAL HIGH (ref 0.61–1.24)
GFR calc Af Amer: 33 mL/min — ABNORMAL LOW (ref >60)
GFR calc Af Amer: 34 mL/min — ABNORMAL LOW (ref >60)
GFR calc non Af Amer: 28 mL/min — ABNORMAL LOW (ref >60)
GFR calc non Af Amer: 29 mL/min — ABNORMAL LOW (ref >60)
Glucose, Bld: 217 mg/dL — ABNORMAL HIGH (ref 70–99)
Glucose, Bld: 267 mg/dL — ABNORMAL HIGH (ref 70–99)
Potassium: 5.2 mmol/L — ABNORMAL HIGH (ref 3.5–5.1)
Potassium: 5.1 mmol/L (ref 3.5–5.1)
Sodium: 124 mmol/L — ABNORMAL LOW (ref 135–145)
Sodium: 123 mmol/L — ABNORMAL LOW (ref 135–145)

## 2019-08-12 LAB — CBC WITH DIFFERENTIAL/PLATELET
Abs Immature Granulocytes: 0.12 10*3/uL — ABNORMAL HIGH (ref 0.00–0.07)
Basophils Absolute: 0 10*3/uL (ref 0.0–0.1)
Basophils Relative: 0 %
Eosinophils Absolute: 0 10*3/uL (ref 0.0–0.5)
Eosinophils Relative: 0 %
HCT: 33.1 % — ABNORMAL LOW (ref 39.0–52.0)
Hemoglobin: 11.2 g/dL — ABNORMAL LOW (ref 13.0–17.0)
Immature Granulocytes: 1 %
Lymphocytes Relative: 6 %
Lymphs Abs: 0.7 10*3/uL (ref 0.7–4.0)
MCH: 32 pg (ref 26.0–34.0)
MCHC: 33.8 g/dL (ref 30.0–36.0)
MCV: 94.6 fL (ref 80.0–100.0)
Monocytes Absolute: 0.9 10*3/uL (ref 0.1–1.0)
Monocytes Relative: 8 %
Neutro Abs: 9.7 10*3/uL — ABNORMAL HIGH (ref 1.7–7.7)
Neutrophils Relative %: 85 %
Platelets: 86 10*3/uL — ABNORMAL LOW (ref 150–400)
RBC: 3.5 MIL/uL — ABNORMAL LOW (ref 4.22–5.81)
RDW: 14.3 % (ref 11.5–15.5)
WBC: 11.5 10*3/uL — ABNORMAL HIGH (ref 4.0–10.5)
nRBC: 0.4 % — ABNORMAL HIGH (ref 0.0–0.2)

## 2019-08-12 LAB — HEPATIC FUNCTION PANEL
ALT: 1217 U/L — ABNORMAL HIGH (ref 0–44)
AST: 1216 U/L — ABNORMAL HIGH (ref 15–41)
Albumin: 2.8 g/dL — ABNORMAL LOW (ref 3.5–5.0)
Alkaline Phosphatase: 400 U/L — ABNORMAL HIGH (ref 38–126)
Bilirubin, Direct: 1 mg/dL — ABNORMAL HIGH (ref 0.0–0.2)
Indirect Bilirubin: 1.6 mg/dL — ABNORMAL HIGH (ref 0.3–0.9)
Total Bilirubin: 2.6 mg/dL — ABNORMAL HIGH (ref 0.3–1.2)
Total Protein: 5.2 g/dL — ABNORMAL LOW (ref 6.5–8.1)

## 2019-08-12 LAB — GLUCOSE, CAPILLARY
Glucose-Capillary: 196 mg/dL — ABNORMAL HIGH (ref 70–99)
Glucose-Capillary: 265 mg/dL — ABNORMAL HIGH (ref 70–99)
Glucose-Capillary: 243 mg/dL — ABNORMAL HIGH (ref 70–99)
Glucose-Capillary: 256 mg/dL — ABNORMAL HIGH (ref 70–99)

## 2019-08-12 LAB — MAGNESIUM: Magnesium: 2.2 mg/dL (ref 1.7–2.4)

## 2019-08-12 MED ORDER — ADENOSINE 12 MG/4ML IV SOLN
INTRAVENOUS | Status: AC
  Administered 2019-08-12: 6 mg via INTRAVENOUS
  Filled 2019-08-12: qty 4

## 2019-08-12 MED ORDER — ADENOSINE 12 MG/4ML IV SOLN: 6.0000 mg | Freq: Once | INTRAVENOUS | Status: AC

## 2019-08-12 MED ORDER — METOPROLOL TARTRATE 5 MG/5ML IV SOLN: 5.0000 mg | Freq: Once | INTRAVENOUS | Status: DC

## 2019-08-12 MED ORDER — PHENYLEPHRINE CONCENTRATED 100MG/250ML (0.4 MG/ML) INFUSION SIMPLE: 0.0000 ug/min | INTRAVENOUS | Status: AC

## 2019-08-12 MED ORDER — METOPROLOL TARTRATE 5 MG/5ML IV SOLN
INTRAVENOUS | Status: AC
  Administered 2019-08-12: 5 mg
  Filled 2019-08-12: qty 5

## 2019-08-12 MED ORDER — DOBUTAMINE IN D5W 4-5 MG/ML-% IV SOLN: 5.0000 ug/kg/min | INTRAVENOUS | Status: AC

## 2019-08-12 MED ORDER — PHENYLEPHRINE CONCENTRATED 100MG/250ML (0.4 MG/ML) INFUSION SIMPLE
0.0000 ug/min | INTRAVENOUS | Status: DC
  Administered 2019-08-12: 20 ug/min via INTRAVENOUS
  Administered 2019-08-12: 180 ug/min via INTRAVENOUS
  Administered 2019-08-13 (×2): 100 ug/min via INTRAVENOUS
  Filled 2019-08-12 (×4): qty 250

## 2019-08-12 MED ORDER — METOPROLOL TARTRATE 5 MG/5ML IV SOLN
2.5000 mg | Freq: Four times a day (QID) | INTRAVENOUS | Status: DC | PRN
  Administered 2019-08-12: 5 mg via INTRAVENOUS
  Administered 2019-08-13 (×2): 2.5 mg via INTRAVENOUS
  Filled 2019-08-12 (×3): qty 5

## 2019-08-12 MED ORDER — DIGOXIN 0.25 MG/ML IJ SOLN
0.0625 mg | Freq: Once | INTRAMUSCULAR | Status: AC
  Administered 2019-08-12: 0.0625 mg via INTRAVENOUS
  Filled 2019-08-12: qty 2

## 2019-08-12 MED ORDER — NOREPINEPHRINE 16 MG/250ML-% IV SOLN
0.0000 ug/min | INTRAVENOUS | Status: DC
  Administered 2019-08-12: 2 ug/min via INTRAVENOUS
  Filled 2019-08-12: qty 250

## 2019-08-12 NOTE — Patient Outreach (Signed)
[facility]) Care Management  08/12/2019  DUSTEN ELLINWOOD [birth year] [id number]   Care coordination call placed to [employer] in regards to patient's application for [employer].   Spoke to [place] who informed the income on the 1040 that patient submitted compared to the number of people he wrote on household which is 2 would make him ineligible for the program due to being over income. Malachi Bonds noted that on the 1040 that it shows a household of 3 which could change the outcome. She informed to verify the number of people in household and resubmit if different otherwise patient is denied for the program.   Will consult with embedded [facility] RPh Catie Feliz Beam and resubmit if necessary.  Emilina Smarr P. Abigail Teall, CPhT [facility] [employer]  [phone]

## 2019-08-12 NOTE — Progress Notes (Addendum)
Follow up - Critical Care Medicine Note  Patient Details:    Matthew Wolf is an 74 y.o. male with medical history significant forcoronary artery disease with ischemic cardiomyopathy,history of COVID-19 infection about 43months ago, hypertension, diabetes mellitus and chronic kidney disease who presents to the emergency room for evaluation of fatigue, generalized weakness and poor oral intake.Patient is status post dual-chamber AICD insertion on04/19/21for ischemic cardiomyopathy as well as AV nodal reentry tachycardia. Patient had a 2D echocardiogramon 04/01/21which shows severely depressed LVdysfunction with LVEF of 15%.  He has decompensation of systolic heart failure now requiring dobutamine and vasopressors.  Lines, Airways, Drains: CVC Triple Lumen 08/11/19 Left Internal jugular (Active)  Indication for Insertion or Continuance of Line Vasoactive infusions 08/12/19 0800  Site Assessment Clean;Dry;Intact 08/12/19 0800  Proximal Lumen Status Infusing 08/11/19 1930  Medial Lumen Status Infusing 08/11/19 1930  Distal Lumen Status Flushed;Saline locked 08/11/19 1930  Dressing Type Transparent;Occlusive 08/12/19 0800  Dressing Status Clean;Dry;Intact 08/12/19 0800  Line Care Connections checked and tightened 08/12/19 0800  Dressing Intervention New dressing 08/11/19 1930     External Urinary Catheter (Active)  Collection Container Dedicated Suction Canister 08/12/19 1400  Securement Method Securing device (Describe) 08/12/19 1400  Site Assessment Clean;Intact 08/12/19 1400    Anti-infectives:  Anti-infectives (From admission, onward)   None      Microbiology: Results for orders placed or performed during the hospital encounter of 08/08/19  Respiratory Panel by RT PCR (Flu A&B, Covid) - Nasopharyngeal Swab     Status: None   Collection Time: 08/08/19  9:43 AM   Specimen: Nasopharyngeal Swab  Result Value Ref Range Status   SARS Coronavirus 2 by RT PCR NEGATIVE NEGATIVE  Final    Comment: (NOTE) SARS-CoV-2 target nucleic acids are NOT DETECTED. The SARS-CoV-2 RNA is generally detectable in upper respiratoy specimens during the acute phase of infection. The lowest concentration of SARS-CoV-2 viral copies this assay can detect is 131 copies/mL. A negative result does not preclude SARS-Cov-2 infection and should not be used as the sole basis for treatment or other patient management decisions. A negative result may occur with  improper specimen collection/handling, submission of specimen other than nasopharyngeal swab, presence of viral mutation(s) within the areas targeted by this assay, and inadequate number of viral copies (<131 copies/mL). A negative result must be combined with clinical observations, patient history, and epidemiological information. The expected result is Negative. Fact Sheet for Patients:  https://www.moore.com/ Fact Sheet for Healthcare Providers:  https://www.young.biz/ This test is not yet ap proved or cleared by the [place] FDA and  has been authorized for detection and/or diagnosis of SARS-CoV-2 by FDA under an Emergency Use Authorization (EUA). This EUA will remain  in effect (meaning this test can be used) for the duration of the COVID-19 declaration under Section 564(b)(1) of the Act, 21 U.S.C. section 360bbb-3(b)(1), unless the authorization is terminated or revoked sooner.    Influenza A by PCR NEGATIVE NEGATIVE Final   Influenza B by PCR NEGATIVE NEGATIVE Final    Comment: (NOTE) The Xpert Xpress SARS-CoV-2/FLU/RSV assay is intended as an aid in  the diagnosis of influenza from Nasopharyngeal swab specimens and  should not be used as a sole basis for treatment. Nasal washings and  aspirates are unacceptable for Xpert Xpress SARS-CoV-2/FLU/RSV  testing. Fact Sheet for Patients: https://www.moore.com/ Fact Sheet for Healthcare  Providers: https://www.young.biz/ This test is not yet approved or cleared by the [place] FDA and  has been authorized for detection and/or diagnosis  of SARS-CoV-2 by  FDA under an Emergency Use Authorization (EUA). This EUA will remain  in effect (meaning this test can be used) for the duration of the  Covid-19 declaration under Section 564(b)(1) of the Act, 21  U.S.C. section 360bbb-3(b)(1), unless the authorization is  terminated or revoked. Performed at [facility], [address] [place], [place] [address]   MRSA PCR Screening     Status: None   Collection Time: 08/09/19  9:11 PM   Specimen: Nasal Mucosa; Nasopharyngeal  Result Value Ref Range Status   MRSA by PCR NEGATIVE NEGATIVE Final    Comment:        The GeneXpert MRSA Assay (FDA approved for NASAL specimens only), is one component of a comprehensive MRSA colonization surveillance program. It is not intended to diagnose MRSA infection nor to guide or monitor treatment for MRSA infections. Performed at [facility] 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 EXAM: CHEST  1 VIEW COMPARISON:  08/08/2019 FINDINGS: Slight worsening lung aeration likely pulmonary edema. Underlying severe chronic lung disease. No definite pleural effusions. IMPRESSION: Worsening lung aeration, likely pulmonary edema. Electronically Signed   By: Rudie Meyer M.D.   On: 08/09/2019 20:35   US RENAL  Result Date: 08/11/2019 CLINICAL DATA:  Acute renal failure. EXAM: RENAL / URINARY TRACT ULTRASOUND COMPLETE COMPARISON:  July 26, 2013. FINDINGS: Right Kidney: Renal measurements: 11.0 x 5.3 x 4.8 cm = volume: 148 mL . Echogenicity within normal limits. No mass or hydronephrosis visualized.  Left Kidney: Renal measurements: 12.2 x 6.2 x 4.5 cm = volume: 177 mL. Echogenicity within normal limits. No mass or hydronephrosis visualized. Bladder: Appears normal for degree of bladder distention. Other: Mild ascites is noted. IMPRESSION: No significant renal abnormality is noted.  Mild ascites is noted. Electronically Signed   By: Lupita Raider M.D.   On: 08/11/2019 10:47   DG Chest Port 1 View  Result Date: 08/11/2019 CLINICAL DATA:  Central catheter placement EXAM: PORTABLE CHEST 1 VIEW COMPARISON:  Aug 09, 2019 FINDINGS: Central catheter tip is in the superior vena cava. No pneumothorax. There is underlying fibrotic type change. There is no appreciable edema or airspace opacity currently. Heart is mildly enlarged with pulmonary vascularity normal. Pacemaker leads are attached to the right atrium and right ventricle. Patient is status post coronary artery bypass grafting. No adenopathy evident. There is aortic atherosclerosis. No bone lesions. IMPRESSION: Underlying fibrotic type change. No frank edema or airspace opacity. Stable cardiac silhouette. Pacemaker leads attached to right atrium and right ventricle. Status post coronary artery bypass grafting. Aortic Atherosclerosis (ICD10-I70.0). Electronically Signed   By: Bretta Bang III M.D.   On: 08/11/2019 11:32   DG Chest Portable 1 View  Result Date: 08/08/2019 CLINICAL DATA:  Weakness, sick for 3 days, anorexia, fatigue, had COVID-19 4 months ago, question CHF EXAM: PORTABLE CHEST 1 VIEW COMPARISON:  Portable exam 0810 hours compared to 02/09/2019 FINDINGS: LEFT subclavian ICD with leads projecting over RIGHT atrium and RIGHT ventricle. Enlargement of cardiac silhouette post CABG. Atherosclerotic calcification aorta. Scattered infiltrates in the mid to lower lungs, chronic, likely chronic interstitial lung disease with a peripheral and basilar predominance. No definite superimposed infiltrate or pleural effusion. No pneumothorax. IMPRESSION:  Chronic interstitial changes in the periphery of both lungs favor  chronic interstitial lung disease. Enlargement of cardiac silhouette post CABG and ICD. No acute abnormalities. Aortic Atherosclerosis (ICD10-I70.0). Electronically Signed   By: Lavonia Dana M.D.   On: 08/08/2019 08:28    Consults: Treatment Team:  Murlean Iba, MD Corey Skains, MD [facility], Ander Gaster, MD Isaias Cowman, MD   Subjective:    Overnight Issues: Issues with AV nodal reentry tachycardia overnight.  Episode was abolished after administration of adenosine to examine the rhythm.  Remains on dobutamine.  Had to discontinue norepinephrine due to the reentrant tachycardia.  Now on Neo-Synephrine.  Objective:  Vital signs for last 24 hours: Temp:  [97.4 F (36.3 C)-97.8 F (36.6 C)] 97.8 F (36.6 C) (05/07 1500) Pulse Rate:  [87-147] 102 (05/07 1700) Resp:  [10-27] 23 (05/07 1700) BP: (79-109)/(59-87) 98/66 (05/07 1700) SpO2:  [62 %-100 %] 99 % (05/07 1700)  Hemodynamic parameters for last 24 hours:    Intake/Output from previous day: 05/06 0701 - 05/07 0700 In: -  Out: 30 [Urine:30]  Intake/Output this shift: Total I/O In: 496.8 [I.V.:496.8] Out: 46 [Urine:46]  Vent settings for last 24 hours:    Physical Exam:  GENERAL: Very debilitated appearing gentleman, tachypneic HEAD: Normocephalic, atraumatic.  EYES: Pupils equal, round, reactive to light.  No scleral icterus.  MOUTH: Oral mucosa moist. NECK: Supple. No thyromegaly. No nodules. +JVD.  Positive hepatojugular reflux. PULMONARY: Coarse breath sounds, rhonchi in the upper lung zones.  Bibasilar crackles.  CARDIOVASCULAR: S1 and S2.  Monitor: Sinus tach.  No rubs or murmurs noted.  S3 gallop noted. GASTROINTESTINAL: Nondistended, soft. MUSCULOSKELETAL: No joint deformity, no clubbing, +2 pitting edema LE.  NEUROLOGIC: Awake, alert, interactive, speech is fluent.  Diffuse muscle weakness with no focal deficits. SKIN:  Intact,cool,dry PSYCH: Mood and behavior appropriate    Pressure Injury 08/10/19 Heel Left Deep Tissue Pressure Injury - Purple or maroon localized area of discolored intact skin or blood-filled blister due to damage of underlying soft tissue from pressure and/or shear. (Active)  08/10/19 0800  Location: Heel  Location Orientation: Left  Staging: Deep Tissue Pressure Injury - Purple or maroon localized area of discolored intact skin or blood-filled blister due to damage of underlying soft tissue from pressure and/or shear.  Wound Description (Comments):   Present on Admission:     Assessment/Plan:   Acute on chronic decompensation of systolic heart failure Ischemic cardiomyopathy LVEF less than 15% AV nodal reentry tachycardia Status post dual-chamber ICD 25 July 2019 Original plan for transfer to [facility] for advanced heart failure management Await transfer to [facility] Follow cardiology recommendations Continue dobutamine as tolerated Norepinephrine switch to Neo-Synephrine due to recurrent AVNRT Prognosis is guarded at best From PCCM standpoint recommend ongoing discussions with Palliative Care  Cardiorenal syndrome AKI Hyperkalemia Hyponatremia Nephrology following, appreciate input [place] for hyperkalemia From a hemodynamic standpoint he is poor candidate for CVVH or hemodialysis  Transaminitis Passive congestion of the liver Low cardiac output state Poor prognosis  Severe protein calorie malnutrition Cardiac cachexia Supplement protein  Type 2 diabetes with insulin requirement Sliding scale insulin  Personal history of COVID-24 January 2019 Residual post inflammatory pulmonary fibrosis     Nutrition Status: Nutrition Problem: Severe Malnutrition Etiology: chronic illness(CHF, DM) Signs/Symptoms: severe fat depletion, moderate fat depletion, moderate muscle depletion, severe muscle depletion Interventions: Ensure Enlive (each supplement provides 350kcal and  20 grams of protein), Glucerna shake, MVI  Overall prognosis is very poor.  He has multiorgan failure due to severe decompensation of systolic heart failure.   LOS:  4 days   Additional comments: Discussed during multidisciplinary rounds today.  Critical Care Total Time*:   C. Danice Goltz, MD [place] [facility] 08/12/2019  *Care during the described time interval was provided by me and/or other providers on the critical care team.  I have reviewed this patient's available data, including medical history, events of note, physical examination and test results as part of my evaluation.   **This note was dictated using voice recognition software/Dragon.  Despite best efforts to proofread, errors can occur which can change the meaning.  Any change was purely unintentional.

## 2019-08-12 NOTE — Progress Notes (Signed)
[facility] Cardiology [facility] Encounter Note  Patient: Matthew Wolf / Admit Date: 08/08/2019 / Date of Encounter: 08/12/2019, 3:37 PM   Subjective: Patient appears to be relatively stable today with no evidence of significant further rhythm disturbances this afternoon and or worsening symptoms.  Patient did have AV nodal reentrant tachycardia throughout last night with rapid ventricular rate now requiring continued treatment including metoprolol.  Heart rate is 90 to 100 bpm.  In addition to that the patient continues to be on furosemide dobutamine combination for congestive heart failure which is unchanged from before.  The patient has not been able to tolerate dopamine due to rapid heart rate.  Patient currently is awaiting tertiary care treatment  Review of Systems: Positive for: Weakness Negative for: Vision change, hearing change, syncope, dizziness, nausea, vomiting,diarrhea, bloody stool, stomach pain, cough, congestion, diaphoresis, urinary frequency, urinary pain,skin lesions, skin rashes Others previously listed  Objective: Telemetry: Normal sinus rhythm Physical Exam: Blood pressure 108/68, pulse (!) 102, temperature 97.8 F (36.6 C), resp. rate 14, height 5\' 7"  (1.702 m), weight 65.3 kg, SpO2 100 %. Body mass index is 22.55 kg/m. General: Well developed, well nourished, in no acute distress. Head: Normocephalic, atraumatic, sclera non-icteric, no xanthomas, nares are without discharge. Neck: No apparent masses Lungs: Normal respirations with no wheezes, few rhonchi, no rales , basilar crackles   Heart: Regular rate and rhythm, normal S1 S2, no murmur, no rub, no gallop, PMI is normal size and placement, carotid upstroke normal without bruit, jugular venous pressure normal Abdomen: Soft, non-tender, non-distended with normoactive bowel sounds. No hepatosplenomegaly. Abdominal aorta is normal size without bruit Extremities: Trace edema, no clubbing, no cyanosis, no ulcers,   Peripheral: 2+ radial, 2+ femoral, 2+ dorsal pedal pulses Neuro: Alert and oriented. Moves all extremities spontaneously. Psych:  Responds to questions appropriately with a normal affect.   Intake/Output Summary (Last 24 hours) at 08/12/2019 1537 Last data filed at 08/12/2019 1400 Gross per 24 hour  Intake 496.79 ml  Output 76 ml  Net 420.79 ml    Inpatient Medications:  . aspirin EC  81 mg Oral Daily  . Chlorhexidine Gluconate Cloth  6 each Topical Daily  . feeding supplement (GLUCERNA SHAKE)  237 mL Oral BID BM  . insulin aspart  0-15 Units Subcutaneous TID WC  . magnesium oxide  400 mg Oral Daily  . metoprolol succinate  12.5 mg Oral Daily  . midodrine  10 mg Oral TID WC  . mometasone-formoterol  2 puff Inhalation BID  . multivitamin with minerals  1 tablet Oral Daily  . omega-3 acid ethyl esters  1 g Oral Daily  . patiromer  16.8 g Oral Daily  . Ensure Max Protein  11 oz Oral Daily  . sodium chloride flush  3 mL Intravenous Q12H   Infusions:  . sodium chloride    . DOBUTamine 5.0026 mcg/kg/min (08/12/19 1400)  . phenylephrine (NEO-SYNEPHRINE) Adult infusion 160 mcg/min (08/12/19 1400)    Labs: Recent Labs    08/11/19 0427 08/11/19 0716 08/12/19 0500 08/12/19 1409  NA  --    < > 124* 123*  K  --    < > 5.2* 5.1  CL  --    < > 92* 90*  CO2  --    < > 24 24  GLUCOSE  --    < > 217* 267*  BUN  --    < > 72* 72*  CREATININE  --    < > 2.20* 2.14*  CALCIUM  --    < > 8.2* 8.2*  MG 2.4  --  2.2  --    < > = values in this interval not displayed.   Recent Labs    08/12/19 0500  AST 1,216*  ALT 1,217*  ALKPHOS 400*  BILITOT 2.6*  PROT 5.2*  ALBUMIN 2.8*   Recent Labs    08/11/19 0427 08/12/19 0500  WBC 13.5* 11.5*  NEUTROABS 11.5* 9.7*  HGB 11.6* 11.2*  HCT 34.5* 33.1*  MCV 94.5 94.6  PLT 96* 86*   No results for input(s): CKTOTAL, CKMB, TROPONINI in the last 72 hours. Invalid input(s): POCBNP No results for input(s): HGBA1C in the last 72 hours.    Weights: Filed Weights   08/08/19 0739 08/09/19 0401 08/09/19 2100  Weight: 59 kg 63.5 kg 65.3 kg     Radiology/Studies:  DG Chest 1 View  Result Date: 08/09/2019 CLINICAL DATA:  Shortness of breath. EXAM: CHEST  1 VIEW COMPARISON:  08/08/2019 FINDINGS: Slight worsening lung aeration likely pulmonary edema. Underlying severe chronic lung disease. No definite pleural effusions. IMPRESSION: Worsening lung aeration, likely pulmonary edema. Electronically Signed   By: Rudie Meyer M.D.   On: 08/09/2019 20:35   US RENAL  Result Date: 08/11/2019 CLINICAL DATA:  Acute renal failure. EXAM: RENAL / URINARY TRACT ULTRASOUND COMPLETE COMPARISON:  July 26, 2013. FINDINGS: Right Kidney: Renal measurements: 11.0 x 5.3 x 4.8 cm = volume: 148 mL . Echogenicity within normal limits. No mass or hydronephrosis visualized. Left Kidney: Renal measurements: 12.2 x 6.2 x 4.5 cm = volume: 177 mL. Echogenicity within normal limits. No mass or hydronephrosis visualized. Bladder: Appears normal for degree of bladder distention. Other: Mild ascites is noted. IMPRESSION: No significant renal abnormality is noted.  Mild ascites is noted. Electronically Signed   By: Lupita Raider M.D.   On: 08/11/2019 10:47   DG Chest Port 1 View  Result Date: 08/11/2019 CLINICAL DATA:  Central catheter placement EXAM: PORTABLE CHEST 1 VIEW COMPARISON:  Aug 09, 2019 FINDINGS: Central catheter tip is in the superior vena cava. No pneumothorax. There is underlying fibrotic type change. There is no appreciable edema or airspace opacity currently. Heart is mildly enlarged with pulmonary vascularity normal. Pacemaker leads are attached to the right atrium and right ventricle. Patient is status post coronary artery bypass grafting. No adenopathy evident. There is aortic atherosclerosis. No bone lesions. IMPRESSION: Underlying fibrotic type change. No frank edema or airspace opacity. Stable cardiac silhouette. Pacemaker leads attached to right  atrium and right ventricle. Status post coronary artery bypass grafting. Aortic Atherosclerosis (ICD10-I70.0). Electronically Signed   By: Bretta Bang III M.D.   On: 08/11/2019 11:32   DG Chest Portable 1 View  Result Date: 08/08/2019 CLINICAL DATA:  Weakness, sick for 3 days, anorexia, fatigue, had COVID-19 4 months ago, question CHF EXAM: PORTABLE CHEST 1 VIEW COMPARISON:  Portable exam 0810 hours compared to 02/09/2019 FINDINGS: LEFT subclavian ICD with leads projecting over RIGHT atrium and RIGHT ventricle. Enlargement of cardiac silhouette post CABG. Atherosclerotic calcification aorta. Scattered infiltrates in the mid to lower lungs, chronic, likely chronic interstitial lung disease with a peripheral and basilar predominance. No definite superimposed infiltrate or pleural effusion. No pneumothorax. IMPRESSION: Chronic interstitial changes in the periphery of Wolf lungs favor chronic interstitial lung disease. Enlargement of cardiac silhouette post CABG and ICD. No acute abnormalities. Aortic Atherosclerosis (ICD10-I70.0). Electronically Signed   By: Ulyses Southward M.D.   On: 08/08/2019 08:28  Assessment and Recommendation  74 y.o. male with known severe dilated cardiomyopathy with acute on chronic systolic dysfunction congestive heart failure with cardiorenal syndrome hypotension and AV node reentrant tachycardia continue to have difficulty but somewhat more stable today 1.  Continuation of dobutamine at this time due to slight improvements in cardiac output 2.  No use of dopamine due to concerns of AV node reentrant tachycardia and other tachycardia 3.  Pressure support if needed due to hypotension 4.  Furosemide as per nephrology for pulmonary edema 5.  Further treatment options after transfer  Signed, Serafina Royals M.D. FACC

## 2019-08-12 NOTE — Progress Notes (Addendum)
Pt's HR initially improved to 120s-130's post metoprolol administration, but now sustaining 140's (remains AVNRT). Tried dose of Digoxin with no improvement.  Also having increased vasopressors requirements.  Discussed with Dr. Dellie Catholic of [facility] and Dr. Gwen Pounds with Cardiology who both recommend Adenosine.  Order placed for 6 mg Adenosine x1 dose.   Harlon Ditty, AGACNP-BC [facility] Pager: [phone]

## 2019-08-12 NOTE — Progress Notes (Signed)
Inpatient Diabetes Program Recommendations  AACE/ADA: New Consensus Statement on Inpatient Glycemic Control (2015)  Target Ranges:  Prepandial:   less than 140 mg/dL      Peak postprandial:   less than 180 mg/dL (1-2 hours)      Critically ill patients:  140 - 180 mg/dL   Results for ESTIBEN, MIZUNO (MRN [id number]) as of 08/12/2019 14:24  Ref. Range 08/11/2019 07:26 08/11/2019 11:42 08/11/2019 17:10 08/11/2019 21:00  Glucose-Capillary Latest Ref Range: 70 - 99 mg/dL 324 (H)  5 units NOVOLOG  272 (H)  8 units NOVOLOG  270 (H)  8 units NOVOLOG  217 (H)   Results for MECHEL, SCHUTTER (MRN [id number]) as of 08/12/2019 14:24  Ref. Range 08/12/2019 07:22 08/12/2019 11:23  Glucose-Capillary Latest Ref Range: 70 - 99 mg/dL 664 (H)  3 units NOVOLOG  265 (H)  8 units NOVOLOG      Home DM Meds: Tresiba 10 units Daily       Metformin 500 mg AM / 1000 mg PM  Current Orders: Novolog Moderate Correction Scale/ SSI (0-15 units) TID AC    MD- Note CBGs elevated.  Pt takes Tresiba 10 units Daily at home.  Please consider starting Levemir 8 units Daily (80% total home dose)    --Will follow patient during hospitalization--  Ambrose Finland RN, MSN, CDE Diabetes Coordinator Inpatient Glycemic Control Team Team Pager: [phone] (8a-5p)

## 2019-08-12 NOTE — Progress Notes (Signed)
Nephrology on-call notified about lab work and decreased urine output, awaiting response.

## 2019-08-12 NOTE — Progress Notes (Signed)
[address] [place], [place] [address] Phone [phone]. Fax [phone]  Date: 08/12/2019                  Patient Name:  Matthew Wolf  MRN: [id number]  DOB: [birth year]  Age / Sex: 74 y.o., male         PCP: Marjie Skiff, NP                 Service Requesting Consult: IM/ Erin Fulling, MD                 Reason for Consult: ARF, hyperkalemia            History of Present Illness: Patient is a 74 y.o. male with medical problems of Coronary disease, ischemic cardiomyopathy, CABG, history of COVID-19 infection in October, hypertension, diabetes, chronic kidney disease, who was admitted to [facility] on 08/08/2019 for evaluation of Hyponatremia [E87.1] Other fatigue [R53.83] Acute on chronic heart failure, unspecified heart failure type (HCC) [I50.9] Acute on chronic systolic (congestive) heart failure (HCC) [I50.23]  Patient's wife is at bedside and assists in obtaining history Patient underwent dual-chamber AICD placement at [facility] on April 19 for 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  He has severely depressed LV EF of 20%. Currently diuresis is unsuccessful due to hypotension Patient will also worsening hyponatremia, hyperkalemia Nephrology consult has for assistance with diuresis  Patient is currently on dobutamine drip and norepinephrine drip Multiple family members in the room with him He is reporting some leakage around Foley catheter therefore urine output records may not be accurate Labs show worsening of serum sodium to 124.  Creatinine 2.1-2.2     Current medications: Current Facility-Administered Medications  Medication Dose Route Frequency Provider Last Rate Last Admin  . 0.9 %  sodium chloride infusion  250 mL Intravenous PRN Agbata, Tochukwu, MD      . acetaminophen (TYLENOL) tablet 650 mg  650 mg Oral Q6H PRN Agbata, Tochukwu, MD       Or  . acetaminophen (TYLENOL) suppository 650 mg  650 mg Rectal Q6H PRN Agbata, Tochukwu,  MD      . aspirin EC tablet 81 mg  81 mg Oral Daily Agbata, Tochukwu, MD   81 mg at 08/12/19 0940  . Chlorhexidine Gluconate Cloth 2 % PADS 6 each  6 each Topical Daily Lynn Ito, MD   6 each at 08/11/19 [phone]  . DOBUTamine (DOBUTREX) infusion 4000 mcg/mL  5 mcg/kg/min Intravenous Titrated Leanora Ivanoff, PA-C 4.9 mL/hr at 08/12/19 1400 5.0026 mcg/kg/min at 08/12/19 1400  . feeding supplement (GLUCERNA SHAKE) (GLUCERNA SHAKE) liquid 237 mL  237 mL Oral BID BM Lynn Ito, MD   237 mL at 08/12/19 0954  . furosemide (LASIX) tablet 20 mg  20 mg Oral Daily PRN Leanora Ivanoff, PA-C   Stopped at 08/09/19 1955  . insulin aspart (novoLOG) injection 0-15 Units  0-15 Units Subcutaneous TID WC Agbata, Tochukwu, MD   8 Units at 08/12/19 1204  . levalbuterol (XOPENEX) nebulizer solution 0.63 mg  0.63 mg Nebulization Q8H PRN Jimmye Norman, NP      . magnesium oxide (MAG-OX) tablet 400 mg  400 mg Oral Daily Agbata, Tochukwu, MD   400 mg at 08/12/19 0940  . metoprolol succinate (TOPROL-XL) 24 hr tablet 12.5 mg  12.5 mg Oral Daily Leanora Ivanoff, PA-C   12.5 mg at 08/12/19 0941  . metoprolol tartrate (LOPRESSOR) injection 2.5-5 mg  2.5-5 mg Intravenous Q6H PRN Harlon Ditty  D, NP   5 mg at 08/12/19 0239  . midodrine (PROAMATINE) tablet 10 mg  10 mg Oral TID WC Ralene Muskrat B, MD   10 mg at 08/12/19 1203  . mometasone-formoterol (DULERA) 200-5 MCG/ACT inhaler 2 puff  2 puff Inhalation BID Agbata, Tochukwu, MD   2 puff at 08/12/19 0954  . multivitamin with minerals tablet 1 tablet  1 tablet Oral Daily Nolberto Hanlon, MD   1 tablet at 08/12/19 0939  . omega-3 acid ethyl esters (LOVAZA) capsule 1 g  1 g Oral Daily Agbata, Tochukwu, MD   1 g at 08/12/19 0940  . ondansetron (ZOFRAN) tablet 4 mg  4 mg Oral Q6H PRN Agbata, Tochukwu, MD       Or  . ondansetron (ZOFRAN) injection 4 mg  4 mg Intravenous Q6H PRN Agbata, Tochukwu, MD   4 mg at 08/10/19 0223  . patiromer Daryll Drown) packet 16.8 g  16.8 g Oral Daily  Murlean Iba, MD   16.8 g at 08/11/19 2036  . phenylephrine CONCENTRATED 100mg  in sodium chloride 0.9% 213mL (0.4mg /mL) infusion  0-400 mcg/min Intravenous Titrated Darel Hong D, NP 24 mL/hr at 08/12/19 1400 160 mcg/min at 08/12/19 1400  . protein supplement (ENSURE MAX) liquid  11 oz Oral Daily Nolberto Hanlon, MD   11 oz at 08/12/19 1336  . sodium chloride flush (NS) 0.9 % injection 3 mL  3 mL Intravenous Q12H Agbata, Tochukwu, MD   3 mL at 08/11/19 2200  . sodium chloride flush (NS) 0.9 % injection 3 mL  3 mL Intravenous PRN Agbata, Tochukwu, MD         Vital Signs: Blood pressure 108/68, pulse (!) 102, temperature 97.8 F (36.6 C), resp. rate 14, height 5\' 7"  (1.702 m), weight 65.3 kg, SpO2 100 %.   Intake/Output Summary (Last 24 hours) at 08/12/2019 1521 Last data filed at 08/12/2019 1400 Gross per 24 hour  Intake 496.79 ml  Output 76 ml  Net 420.79 ml    Weight trends: Filed Weights   08/08/19 0739 08/09/19 0401 08/09/19 2100  Weight: 59 kg 63.5 kg 65.3 kg    Physical Exam: General:  Laying in the bed, no acute distress  HEENT  anicteric, moist oral mucous membranes  Neck:  Supple  Lungs:  Bilateral diffuse crackles, [place] O2  Heart::  Tachycardic, irregular  Abdomen:  Soft, distended  Extremities:   + Dependent edema  Neurologic:  Alert, oriented  Skin:  No acute rashes  Access:   Foley:  In place-to be removed       Lab results: Basic Metabolic Panel: Recent Labs  Lab 08/09/19 2029 08/10/19 0411 08/11/19 0427 08/11/19 0716 08/12/19 0500 08/12/19 1409  NA 124*   < >  --  125* 124* 123*  K 5.3*   < >  --  5.8* 5.2* 5.1  CL 93*   < >  --  91* 92* 90*  CO2 19*   < >  --  23 24 24   GLUCOSE 235*   < >  --  249* 217* 267*  BUN 36*   < >  --  58* 72* 72*  CREATININE 1.12   < >  --  2.17* 2.20* 2.14*  CALCIUM 8.5*   < >  --  8.5* 8.2* 8.2*  MG 2.1  --  2.4  --  2.2  --    < > = values in this interval not displayed.    Liver Function Tests: Recent Labs  Lab 08/12/19 0500  AST 1,216*  ALT 1,217*  ALKPHOS 400*  BILITOT 2.6*  PROT 5.2*  ALBUMIN 2.8*   No results for input(s): LIPASE, AMYLASE in the last 168 hours. No results for input(s): AMMONIA in the last 168 hours.  CBC: Recent Labs  Lab 08/11/19 0427 08/12/19 0500  WBC 13.5* 11.5*  NEUTROABS 11.5* 9.7*  HGB 11.6* 11.2*  HCT 34.5* 33.1*  MCV 94.5 94.6  PLT 96* 86*    Cardiac Enzymes: No results for input(s): CKTOTAL, TROPONINI in the last 168 hours.  BNP: Invalid input(s): POCBNP  CBG: Recent Labs  Lab 08/11/19 1142 08/11/19 1710 08/11/19 2100 08/12/19 0722 08/12/19 1123  GLUCAP 272* 270* 217* 196* 265*    Microbiology: Recent Results (from the past 720 hour(s))  Respiratory Panel by RT PCR (Flu A&B, Covid) - Nasopharyngeal Swab     Status: None   Collection Time: 08/08/19  9:43 AM   Specimen: Nasopharyngeal Swab  Result Value Ref Range Status   SARS Coronavirus 2 by RT PCR NEGATIVE NEGATIVE Final    Comment: (NOTE) SARS-CoV-2 target nucleic acids are NOT DETECTED. The SARS-CoV-2 RNA is generally detectable in upper respiratoy specimens during the acute phase of infection. The lowest concentration of SARS-CoV-2 viral copies this assay can detect is 131 copies/mL. A negative result does not preclude SARS-Cov-2 infection and should not be used as the sole basis for treatment or other patient management decisions. A negative result may occur with  improper specimen collection/handling, submission of specimen other than nasopharyngeal swab, presence of viral mutation(s) within the areas targeted by this assay, and inadequate number of viral copies (<131 copies/mL). A negative result must be combined with clinical observations, patient history, and epidemiological information. The expected result is Negative. Fact Sheet for Patients:  https://www.moore.com/https://www.fda.gov/media/142436/download Fact Sheet for Healthcare Providers:   https://www.young.biz/https://www.fda.gov/media/142435/download This test is not yet ap proved or cleared by the [place] FDA and  has been authorized for detection and/or diagnosis of SARS-CoV-2 by FDA under an Emergency Use Authorization (EUA). This EUA will remain  in effect (meaning this test can be used) for the duration of the COVID-19 declaration under Section 564(b)(1) of the Act, 21 U.S.C. section 360bbb-3(b)(1), unless the authorization is terminated or revoked sooner.    Influenza A by PCR NEGATIVE NEGATIVE Final   Influenza B by PCR NEGATIVE NEGATIVE Final    Comment: (NOTE) The Xpert Xpress SARS-CoV-2/FLU/RSV assay is intended as an aid in  the diagnosis of influenza from Nasopharyngeal swab specimens and  should not be used as a sole basis for treatment. Nasal washings and  aspirates are unacceptable for Xpert Xpress SARS-CoV-2/FLU/RSV  testing. Fact Sheet for Patients: https://www.moore.com/https://www.fda.gov/media/142436/download Fact Sheet for Healthcare Providers: https://www.young.biz/https://www.fda.gov/media/142435/download This test is not yet approved or cleared by the [place] FDA and  has been authorized for detection and/or diagnosis of SARS-CoV-2 by  FDA under an Emergency Use Authorization (EUA). This EUA will remain  in effect (meaning this test can be used) for the duration of the  Covid-19 declaration under Section 564(b)(1) of the Act, 21  U.S.C. section 360bbb-3(b)(1), unless the authorization is  terminated or revoked. Performed at [facility], [address] [place], [place] [address]   MRSA PCR Screening     Status: None   Collection Time: 08/09/19  9:11 PM   Specimen: Nasal Mucosa; Nasopharyngeal  Result Value Ref Range Status   MRSA by PCR NEGATIVE NEGATIVE Final    Comment:  The GeneXpert MRSA Assay (FDA approved for NASAL specimens only), is one component of a comprehensive MRSA colonization surveillance program. It is not intended to diagnose MRSA infection nor to  guide or monitor treatment for MRSA infections. Performed at [facility], [address] [place], [place] [address]      Coagulation Studies: No results for input(s): LABPROT, INR in the last 72 hours.  Urinalysis: No results for input(s): COLORURINE, LABSPEC, PHURINE, GLUCOSEU, HGBUR, BILIRUBINUR, KETONESUR, PROTEINUR, UROBILINOGEN, NITRITE, LEUKOCYTESUR in the last 72 hours.  Invalid input(s): APPERANCEUR      Imaging: US RENAL  Result Date: 08/11/2019 CLINICAL DATA:  Acute renal failure. EXAM: RENAL / URINARY TRACT ULTRASOUND COMPLETE COMPARISON:  July 26, 2013. FINDINGS: Right Kidney: Renal measurements: 11.0 x 5.3 x 4.8 cm = volume: 148 mL . Echogenicity within normal limits. No mass or hydronephrosis visualized. Left Kidney: Renal measurements: 12.2 x 6.2 x 4.5 cm = volume: 177 mL. Echogenicity within normal limits. No mass or hydronephrosis visualized. Bladder: Appears normal for degree of bladder distention. Other: Mild ascites is noted. IMPRESSION: No significant renal abnormality is noted.  Mild ascites is noted. Electronically Signed   By: Lupita Raider M.D.   On: 08/11/2019 10:47   DG Chest Port 1 View  Result Date: 08/11/2019 CLINICAL DATA:  Central catheter placement EXAM: PORTABLE CHEST 1 VIEW COMPARISON:  Aug 09, 2019 FINDINGS: Central catheter tip is in the superior vena cava. No pneumothorax. There is underlying fibrotic type change. There is no appreciable edema or airspace opacity currently. Heart is mildly enlarged with pulmonary vascularity normal. Pacemaker leads are attached to the right atrium and right ventricle. Patient is status post coronary artery bypass grafting. No adenopathy evident. There is aortic atherosclerosis. No bone lesions. IMPRESSION: Underlying fibrotic type change. No frank edema or airspace opacity. Stable cardiac silhouette. Pacemaker leads attached to right atrium and right ventricle. Status post coronary artery bypass grafting.  Aortic Atherosclerosis (ICD10-I70.0). Electronically Signed   By: Bretta Bang III M.D.   On: 08/11/2019 11:32     Assessment & Plan: Pt is a 74 y.o. Caucasian  male with  Ischemic cardiomyopathy CABG 1991, stents 2002 ICD 2013, 2021 Diabetes Hypertension Hyperlipidemia History of Covid infection October 2019   was admitted on 08/08/2019 with Hyponatremia [E87.1] Other fatigue [R53.83] Acute on chronic heart failure, unspecified heart failure type (HCC) [I50.9] Acute on chronic systolic (congestive) heart failure (HCC) [I50.23]    #AKI #Lower extremity edema #Hyperkalemia #Hyponatremia #Exacerbation of acute on chronic systolic CHF  Baseline creatinine of 0.89 from July 18, 2019 Since then, creatinine has been steadily increasing and is fluctuating between 2.1-2.2 Patient hypotensive and has developed lower extremity edema. Renal ultrasound is negative for obstruction AKI is likely secondary to cardiorenal syndrome, ATN  Plan: Continue inotropic therapy as recommended by cardiology team.  Hyponatremia is related to CHF and volume overload Once adequate diuresis can be achieved with better blood pressure, potassium and Sodium are expected to correct.   Continue Veltassa until potassium level is in normal range  Patient is Critically ill and is at borderline of requiring renal replacement therapy.  Patient's family is willing to proceed with dialysis if necessary. For now we will remove Foley catheter for comfort and monitor urine output We will have low threshold for starting dialysis if patient experiences clinical deterioration We will follow   LOS: 4 Fatiha Guzy 5/7/20213:21 PM    Note: This note was prepared with Dragon dictation. Any transcription errors  are unintentional

## 2019-08-12 NOTE — Progress Notes (Signed)
Called to bedside by nursing as pt with Narrow complex tachycardia (HR 140's-150's).  Order given to d/c Dopamine and will change to Neo-synephrine for BP support, while continuing Dobutamine for inotrope support given severe HFrEF.  Once BP stable with pressors, can given prn Metoprolol as recommended per Cardiology.    Discussed with pt and his wife at bedside, along with nursing.   Harlon Ditty, AGACNP-BC [facility] Pager: [phone]

## 2019-08-12 NOTE — Progress Notes (Signed)
Dr. Marcos Eke notified of patients liver enzymes. No additional orders at this time.

## 2019-08-12 NOTE — Progress Notes (Signed)
Brief change of service note.  Nonbillable note.  Patient is a 74 year old male initially on [facility] hospitalist service for ischemic cardiomyopathy.  On 08/11/2019 worsening hypotension and multiorgan failure noted.  Nephrology and ICU on board.  Patient started on vasopressor/inotrope support.    Patient is currently pending transfer to [facility] for advanced heart failure service and possibly electrophysiology involvement.  Cardiology at [facility] has been following.  Transfer has been delayed by lack of bed availability at [facility].  Case has been discussed with PCCM attending Dr. Belia Heman.  Care will be transitioned to the ICU team given cardiogenic shock requiring vasopressor support.  [facility] hospitalist service to sign off at this time.  Please reach out with any questions thank you.  Lolita Patella MD

## 2019-08-12 NOTE — Progress Notes (Signed)
With administration of Adenosine, pt converted back to NSR (HR 80's).     Harlon Ditty, AGACNP-BC [facility] Pager: [phone]

## 2019-08-13 ENCOUNTER — Inpatient Hospital Stay: Payer: Medicare HMO

## 2019-08-13 LAB — CBC WITH DIFFERENTIAL/PLATELET
Abs Immature Granulocytes: 0.1 10*3/uL — ABNORMAL HIGH (ref 0.00–0.07)
Basophils Absolute: 0 10*3/uL (ref 0.0–0.1)
Basophils Relative: 0 %
Eosinophils Absolute: 0 10*3/uL (ref 0.0–0.5)
Eosinophils Relative: 0 %
HCT: 33.4 % — ABNORMAL LOW (ref 39.0–52.0)
Hemoglobin: 11.7 g/dL — ABNORMAL LOW (ref 13.0–17.0)
Immature Granulocytes: 1 %
Lymphocytes Relative: 10 %
Lymphs Abs: 1 10*3/uL (ref 0.7–4.0)
MCH: 32.3 pg (ref 26.0–34.0)
MCHC: 35 g/dL (ref 30.0–36.0)
MCV: 92.3 fL (ref 80.0–100.0)
Monocytes Absolute: 1.1 10*3/uL — ABNORMAL HIGH (ref 0.1–1.0)
Monocytes Relative: 11 %
Neutro Abs: 8.3 10*3/uL — ABNORMAL HIGH (ref 1.7–7.7)
Neutrophils Relative %: 78 %
Platelets: 74 10*3/uL — ABNORMAL LOW (ref 150–400)
RBC: 3.62 MIL/uL — ABNORMAL LOW (ref 4.22–5.81)
RDW: 14.5 % (ref 11.5–15.5)
WBC: 10.6 10*3/uL — ABNORMAL HIGH (ref 4.0–10.5)
nRBC: 0.2 % (ref 0.0–0.2)

## 2019-08-13 LAB — HEPATIC FUNCTION PANEL
ALT: 1670 U/L — ABNORMAL HIGH (ref 0–44)
AST: 1107 U/L — ABNORMAL HIGH (ref 15–41)
Albumin: 2.7 g/dL — ABNORMAL LOW (ref 3.5–5.0)
Alkaline Phosphatase: 335 U/L — ABNORMAL HIGH (ref 38–126)
Bilirubin, Direct: 0.7 mg/dL — ABNORMAL HIGH (ref 0.0–0.2)
Indirect Bilirubin: 1.2 mg/dL — ABNORMAL HIGH (ref 0.3–0.9)
Total Bilirubin: 1.9 mg/dL — ABNORMAL HIGH (ref 0.3–1.2)
Total Protein: 5.1 g/dL — ABNORMAL LOW (ref 6.5–8.1)

## 2019-08-13 LAB — MAGNESIUM: Magnesium: 2 mg/dL (ref 1.7–2.4)

## 2019-08-13 LAB — PROTIME-INR
INR: 1.4 — ABNORMAL HIGH (ref 0.8–1.2)
Prothrombin Time: 16.3 seconds — ABNORMAL HIGH (ref 11.4–15.2)

## 2019-08-13 LAB — BASIC METABOLIC PANEL
Anion gap: 7 (ref 5–15)
BUN: 66 mg/dL — ABNORMAL HIGH (ref 8–23)
CO2: 24 mmol/L (ref 22–32)
Calcium: 8.4 mg/dL — ABNORMAL LOW (ref 8.9–10.3)
Chloride: 92 mmol/L — ABNORMAL LOW (ref 98–111)
Creatinine, Ser: 1.87 mg/dL — ABNORMAL HIGH (ref 0.61–1.24)
GFR calc Af Amer: 40 mL/min — ABNORMAL LOW (ref >60)
GFR calc non Af Amer: 34 mL/min — ABNORMAL LOW (ref >60)
Glucose, Bld: 196 mg/dL — ABNORMAL HIGH (ref 70–99)
Potassium: 4.7 mmol/L (ref 3.5–5.1)
Sodium: 123 mmol/L — ABNORMAL LOW (ref 135–145)

## 2019-08-13 LAB — BRAIN NATRIURETIC PEPTIDE: B Natriuretic Peptide: 3970 pg/mL — ABNORMAL HIGH (ref 0.0–100.0)

## 2019-08-13 LAB — GLUCOSE, CAPILLARY
Glucose-Capillary: 177 mg/dL — ABNORMAL HIGH (ref 70–99)
Glucose-Capillary: 212 mg/dL — ABNORMAL HIGH (ref 70–99)
Glucose-Capillary: 151 mg/dL — ABNORMAL HIGH (ref 70–99)
Glucose-Capillary: 129 mg/dL — ABNORMAL HIGH (ref 70–99)

## 2019-08-13 LAB — APTT: aPTT: 39 seconds — ABNORMAL HIGH (ref 24–36)

## 2019-08-13 NOTE — Progress Notes (Signed)
Report called to [place], Charity fundraiser at [facility]. Awaiting transport.

## 2019-08-13 NOTE — Progress Notes (Signed)
Follow up - Critical Care Medicine Note  Patient Details:    Matthew Wolf is an 74 y.o. male with medical history significant forcoronary artery disease with ischemic cardiomyopathy,history of COVID-19 infection about 43months ago, hypertension, diabetes mellitus and chronic kidney disease who presents to the emergency room for evaluation of fatigue, generalized weakness and poor oral intake.Patient is status post dual-chamber AICD insertion on04/19/21for ischemic cardiomyopathy as well as AV nodal reentry tachycardia. Patient had a 2D echocardiogramon 04/01/21which shows severely depressed LVdysfunction with LVEF of 15%.  He has decompensation of systolic heart failure now requiring dobutamine and vasopressors.  Lines, Airways, Drains: CVC Triple Lumen 08/11/19 Left Internal jugular (Active)  Indication for Insertion or Continuance of Line Vasoactive infusions 08/12/19 0800  Site Assessment Clean;Dry;Intact 08/12/19 0800  Proximal Lumen Status Infusing 08/11/19 1930  Medial Lumen Status Infusing 08/11/19 1930  Distal Lumen Status Flushed;Saline locked 08/11/19 1930  Dressing Type Transparent;Occlusive 08/12/19 0800  Dressing Status Clean;Dry;Intact 08/12/19 0800  Line Care Connections checked and tightened 08/12/19 0800  Dressing Intervention New dressing 08/11/19 1930     External Urinary Catheter (Active)  Collection Container Dedicated Suction Canister 08/12/19 1400  Securement Method Securing device (Describe) 08/12/19 1400  Site Assessment Clean;Intact 08/12/19 1400    Anti-infectives:  Anti-infectives (From admission, onward)   None      Microbiology: Results for orders placed or performed during the hospital encounter of 08/08/19  Respiratory Panel by RT PCR (Flu A&B, Covid) - Nasopharyngeal Swab     Status: None   Collection Time: 08/08/19  9:43 AM   Specimen: Nasopharyngeal Swab  Result Value Ref Range Status   SARS Coronavirus 2 by RT PCR NEGATIVE NEGATIVE  Final    Comment: (NOTE) SARS-CoV-2 target nucleic acids are NOT DETECTED. The SARS-CoV-2 RNA is generally detectable in upper respiratoy specimens during the acute phase of infection. The lowest concentration of SARS-CoV-2 viral copies this assay can detect is 131 copies/mL. A negative result does not preclude SARS-Cov-2 infection and should not be used as the sole basis for treatment or other patient management decisions. A negative result may occur with  improper specimen collection/handling, submission of specimen other than nasopharyngeal swab, presence of viral mutation(s) within the areas targeted by this assay, and inadequate number of viral copies (<131 copies/mL). A negative result must be combined with clinical observations, patient history, and epidemiological information. The expected result is Negative. Fact Sheet for Patients:  https://www.moore.com/ Fact Sheet for Healthcare Providers:  https://www.young.biz/ This test is not yet ap proved or cleared by the [place] FDA and  has been authorized for detection and/or diagnosis of SARS-CoV-2 by FDA under an Emergency Use Authorization (EUA). This EUA will remain  in effect (meaning this test can be used) for the duration of the COVID-19 declaration under Section 564(b)(1) of the Act, 21 U.S.C. section 360bbb-3(b)(1), unless the authorization is terminated or revoked sooner.    Influenza A by PCR NEGATIVE NEGATIVE Final   Influenza B by PCR NEGATIVE NEGATIVE Final    Comment: (NOTE) The Xpert Xpress SARS-CoV-2/FLU/RSV assay is intended as an aid in  the diagnosis of influenza from Nasopharyngeal swab specimens and  should not be used as a sole basis for treatment. Nasal washings and  aspirates are unacceptable for Xpert Xpress SARS-CoV-2/FLU/RSV  testing. Fact Sheet for Patients: https://www.moore.com/ Fact Sheet for Healthcare  Providers: https://www.young.biz/ This test is not yet approved or cleared by the [place] FDA and  has been authorized for detection and/or diagnosis  of SARS-CoV-2 by  FDA under an Emergency Use Authorization (EUA). This EUA will remain  in effect (meaning this test can be used) for the duration of the  Covid-19 declaration under Section 564(b)(1) of the Act, 21  U.S.C. section 360bbb-3(b)(1), unless the authorization is  terminated or revoked. Performed at [facility], [address] [place], [place] [address]   MRSA PCR Screening     Status: None   Collection Time: 08/09/19  9:11 PM   Specimen: Nasal Mucosa; Nasopharyngeal  Result Value Ref Range Status   MRSA by PCR NEGATIVE NEGATIVE Final    Comment:        The GeneXpert MRSA Assay (FDA approved for NASAL specimens only), is one component of a comprehensive MRSA colonization surveillance program. It is not intended to diagnose MRSA infection nor to guide or monitor treatment for MRSA infections. 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 EXAM: CHEST  1 VIEW COMPARISON:  08/08/2019 FINDINGS: Slight worsening lung aeration likely pulmonary edema. Underlying severe chronic lung disease. No definite pleural effusions. IMPRESSION: Worsening lung aeration, likely pulmonary edema. Electronically Signed   By: Rudie Meyer M.D.   On: 08/09/2019 20:35   US RENAL  Result Date: 08/11/2019 CLINICAL DATA:  Acute renal failure. EXAM: RENAL / URINARY TRACT ULTRASOUND COMPLETE COMPARISON:  July 26, 2013. FINDINGS: Right Kidney: Renal measurements: 11.0 x 5.3 x 4.8 cm = volume: 148 mL . Echogenicity within normal limits. No mass or hydronephrosis visualized.  Left Kidney: Renal measurements: 12.2 x 6.2 x 4.5 cm = volume: 177 mL. Echogenicity within normal limits. No mass or hydronephrosis visualized. Bladder: Appears normal for degree of bladder distention. Other: Mild ascites is noted. IMPRESSION: No significant renal abnormality is noted.  Mild ascites is noted. Electronically Signed   By: Lupita Raider M.D.   On: 08/11/2019 10:47   DG Chest Port 1 View  Result Date: 08/13/2019 CLINICAL DATA:  Congestive heart failure EXAM: PORTABLE CHEST 1 VIEW COMPARISON:  Aug 11, 2019 FINDINGS: There is underlying fibrotic change. There is no frank edema or consolidation. There is mild cardiac prominence with pulmonary vascularity normal. Pacemaker leads attached to right atrium and right ventricle. Status post coronary artery bypass grafting. Central catheter tip in superior vena cava. No pneumothorax. No bone lesions. No adenopathy evident. IMPRESSION: No appreciable change from 2 days prior. Stable cardiac prominence. Stable pacemaker lead placements. Central catheter tip in superior vena cava. Underlying fibrotic change without frank edema or consolidation evident. Electronically Signed   By: Bretta Bang III M.D.   On: 08/13/2019 07:54   DG Chest Port 1 View  Result Date: 08/11/2019 CLINICAL DATA:  Central catheter placement EXAM: PORTABLE CHEST 1 VIEW COMPARISON:  Aug 09, 2019 FINDINGS: Central catheter tip is in the superior vena cava. No pneumothorax. There is underlying fibrotic type change. There is no appreciable edema or airspace opacity currently. Heart is mildly enlarged with pulmonary vascularity normal. Pacemaker leads are attached to the right atrium and right ventricle. Patient is status post coronary artery bypass grafting. No adenopathy evident. There is aortic atherosclerosis. No bone lesions. IMPRESSION: Underlying fibrotic type change. No frank edema or airspace opacity. Stable cardiac silhouette. Pacemaker leads attached to right atrium and right  ventricle. Status post coronary  artery bypass grafting. Aortic Atherosclerosis (ICD10-I70.0). Electronically Signed   By: Lowella Grip III M.D.   On: 08/11/2019 11:32   DG Chest Portable 1 View  Result Date: 08/08/2019 CLINICAL DATA:  Weakness, sick for 3 days, anorexia, fatigue, had COVID-19 4 months ago, question CHF EXAM: PORTABLE CHEST 1 VIEW COMPARISON:  Portable exam 0810 hours compared to 02/09/2019 FINDINGS: LEFT subclavian ICD with leads projecting over RIGHT atrium and RIGHT ventricle. Enlargement of cardiac silhouette post CABG. Atherosclerotic calcification aorta. Scattered infiltrates in the mid to lower lungs, chronic, likely chronic interstitial lung disease with a peripheral and basilar predominance. No definite superimposed infiltrate or pleural effusion. No pneumothorax. IMPRESSION: Chronic interstitial changes in the periphery of both lungs favor chronic interstitial lung disease. Enlargement of cardiac silhouette post CABG and ICD. No acute abnormalities. Aortic Atherosclerosis (ICD10-I70.0). Electronically Signed   By: Lavonia Dana M.D.   On: 08/08/2019 08:28    Consults: Treatment Team:  Murlean Iba, MD Corey Skains, MD Pccm, Ander Gaster, MD Isaias Cowman, MD   Subjective:    Overnight Issues: No recurrent AV nodal reentry tachycardia.  Uneventful night.  Continues to be on dobutamine, Neo-Synephrine and midodrine.  Feels weak.  Has anorexia.  Objective:  Vital signs for last 24 hours: Temp:  [94.7 F (34.8 C)-97.5 F (36.4 C)] 97.5 F (36.4 C) (05/08 1645) Pulse Rate:  [45-170] 170 (05/08 1645) Resp:  [12-22] 22 (05/08 1700) BP: (82-111)/(51-74) 87/71 (05/08 1700) SpO2:  [86 %-100 %] 93 % (05/08 1645) Heart rate 96 currently  Hemodynamic parameters for last 24 hours:    Intake/Output from previous day: 05/07 0701 - 05/08 0700 In: 837.8 [I.V.:837.8] Out: 646 [Urine:646]  Intake/Output this shift: Total I/O In: 198.9 [I.V.:198.9] Out: -    Vent settings for last 24 hours:    Physical Exam:  GENERAL: Very debilitated appearing gentleman, mildly tachypneic HEAD: Normocephalic, atraumatic.  EYES: Pupils equal, round, reactive to light.  No scleral icterus.  MOUTH: Oral mucosa moist. NECK: Supple. No thyromegaly. No nodules. +JVD, less apparent.  PULMONARY: Coarse breath sounds, bibasilar crackles.  CARDIOVASCULAR: S1 and S2.  Monitor: Sinus tach.  No rubs or murmurs noted.  S3 gallop noted. GASTROINTESTINAL: Nondistended, soft. MUSCULOSKELETAL: No joint deformity, no clubbing, +2 pitting edema LE.  NEUROLOGIC: Awake, alert, interactive, speech is fluent.  Diffuse muscle weakness with no focal deficits. SKIN: Intact,cool,dry PSYCH: Mood depressed, behavior is appropriate    Pressure Injury 08/10/19 Heel Left Deep Tissue Pressure Injury - Purple or maroon localized area of discolored intact skin or blood-filled blister due to damage of underlying soft tissue from pressure and/or shear. (Active)  08/10/19 0800  Location: Heel  Location Orientation: Left  Staging: Deep Tissue Pressure Injury - Purple or maroon localized area of discolored intact skin or blood-filled blister due to damage of underlying soft tissue from pressure and/or shear.  Wound Description (Comments):   Present on Admission:      Pressure Injury 08/13/19 Head Posterior;Upper Deep Tissue Pressure Injury - Purple or maroon localized area of discolored intact skin or blood-filled blister due to damage of underlying soft tissue from pressure and/or shear. bruising and pain to the bac (Active)  08/13/19 0500  Location: Head  Location Orientation: Posterior;Upper  Staging: Deep Tissue Pressure Injury - Purple or maroon localized area of discolored intact skin or blood-filled blister due to damage of underlying soft tissue from pressure and/or shear. (2 x1.5 cm)  Wound Description (Comments): bruising and pain to the back of patients  head  Present on  Admission: No    Assessment/Plan:   Acute on chronic decompensation of systolic heart failure Ischemic cardiomyopathy LVEF less than 15% AV nodal reentry tachycardia Status post dual-chamber ICD 25 July 2019 Original plan for [facility] to [facility] for advanced heart failure management Await transfer to [facility] Follow cardiology recommendations Continue dobutamine as tolerated Norepinephrine switch to Neo-Synephrine due to recurrent AVNRT Prognosis is guarded at best From PCCM standpoint recommend ongoing discussions with Palliative Care  Cardiorenal syndrome AKI Hyperkalemia Hyponatremia Nephrology following, appreciate input Veltassa for hyperkalemia From a hemodynamic standpoint continues to be a poor candidate for CVVH or hemodialysis  Transaminitis Passive congestion of the liver Low cardiac output state Poor prognosis  Severe protein calorie malnutrition Cardiac cachexia Supplement protein  Type 2 diabetes with insulin requirement Sliding scale insulin  Personal history of COVID-24 January 2019 Residual post inflammatory pulmonary fibrosis     Nutrition Status: Nutrition Problem: Severe Malnutrition Etiology: chronic illness(CHF, DM) Signs/Symptoms: severe fat depletion, moderate fat depletion, moderate muscle depletion, severe muscle depletion Interventions: Ensure Enlive (each supplement provides 350kcal and 20 grams of protein), Glucerna shake, MVI  Overall prognosis is very poor.  He has multiorgan failure due to severe decompensation of systolic heart failure.  He has been accepted at [facility] ICU for advanced failure treatment, accepting physician Dr. Gerre Scull.   LOS: 5 days   Additional comments: Updated patient and family at bedside.  Critical Care Total Time*:   C. Danice Goltz, MD [place] [facility] 08/13/2019  *Care during the described time interval was provided by me and/or other providers on the critical care team.  I have reviewed this  patient's available data, including medical history, events of note, physical examination and test results as part of my evaluation.   **This note was dictated using voice recognition software/Dragon.  Despite best efforts to proofread, errors can occur which can change the meaning.  Any change was purely unintentional.

## 2019-08-13 NOTE — Progress Notes (Signed)
[facility]  ROUNDING NOTE   Subjective:  Patient seen and evaluated at bedside. Renal function improving. Creatinine down to 1.87. Urine output was 646 cc over the preceding 24 hours. Son at the bedside.   Objective:  Vital signs in last 24 hours:  Temp:  [94.7 F (34.8 C)-97.5 F (36.4 C)] 97.5 F (36.4 C) (05/08 1645) Pulse Rate:  [45-170] 170 (05/08 1645) Resp:  [12-22] 22 (05/08 1700) BP: (82-111)/(51-87) 87/71 (05/08 1700) SpO2:  [86 %-100 %] 93 % (05/08 1645)  Weight change:  Filed Weights   08/08/19 0739 08/09/19 0401 08/09/19 2100  Weight: 59 kg 63.5 kg 65.3 kg    Intake/Output: I/O last 3 completed shifts: In: 837.8 [I.V.:837.8] Out: 646 [Urine:646]   Intake/Output this shift:  Total I/O In: 198.9 [I.V.:198.9] Out: -   Physical Exam: General: No acute distress  Head: Normocephalic, atraumatic. Moist oral mucosal membranes  Eyes: Anicteric  Neck: Supple, trachea midline  Lungs:  Clear to auscultation, normal effort  Heart: S1S2 irregular  Abdomen:  Soft, nontender, bowel sounds present  Extremities: Trace peripheral edema.  Neurologic: Awake, alert, following commands  Skin: No lesions       Basic Metabolic Panel: Recent Labs  Lab 08/09/19 2029 08/09/19 2029 08/10/19 0411 08/10/19 0411 08/10/19 1011 08/10/19 1651 08/11/19 0427 08/11/19 0716 08/11/19 0716 08/12/19 0500 08/12/19 1409 08/13/19 0448  NA 124*   < > 129*  --   --   --   --  125*  --  124* 123* 123*  K 5.3*   < > 6.4*  --    < > 5.8*  --  5.8*  --  5.2* 5.1 4.7  CL 93*   < > 95*  --   --   --   --  91*  --  92* 90* 92*  CO2 19*   < > 21*  --   --   --   --  23  --  24 24 24   GLUCOSE 235*   < > 192*  --   --   --   --  249*  --  217* 267* 196*  BUN 36*   < > 40*  --   --   --   --  58*  --  72* 72* 66*  CREATININE 1.12   < > 1.33*  --   --   --   --  2.17*  --  2.20* 2.14* 1.87*  CALCIUM 8.5*   < > 8.8*   < >  --   --   --  8.5*   < > 8.2* 8.2* 8.4*  MG 2.1  --    --   --   --   --  2.4  --   --  2.2  --  2.0   < > = values in this interval not displayed.    Liver Function Tests: Recent Labs  Lab 08/08/19 0757 08/12/19 0500 08/13/19 0448  AST 36 1,216* 1,107*  ALT 41 1,217* 1,670*  ALKPHOS 214* 400* 335*  BILITOT 1.1 2.6* 1.9*  PROT 6.6 5.2* 5.1*  ALBUMIN 3.7 2.8* 2.7*   No results for input(s): LIPASE, AMYLASE in the last 168 hours. No results for input(s): AMMONIA in the last 168 hours.  CBC: Recent Labs  Lab 08/09/19 0417 08/10/19 0411 08/11/19 0427 08/12/19 0500 08/13/19 0448  WBC 9.9 12.5* 13.5* 11.5* 10.6*  NEUTROABS  --   --  11.5* 9.7* 8.3*  HGB  13.0 14.1 11.6* 11.2* 11.7*  HCT 38.6* 41.5 34.5* 33.1* 33.4*  MCV 95.8 96.5 94.5 94.6 92.3  PLT 219 200 96* 86* 74*    Cardiac Enzymes: No results for input(s): CKTOTAL, CKMB, CKMBINDEX, TROPONINI in the last 168 hours.  BNP: Invalid input(s): POCBNP  CBG: Recent Labs  Lab 08/12/19 1556 08/12/19 2106 08/13/19 0737 08/13/19 1138 08/13/19 1633  GLUCAP 243* 256* 177* 212* 151*    Microbiology: Results for orders placed or performed during the hospital encounter of 08/08/19  Respiratory Panel by RT PCR (Flu A&B, Covid) - Nasopharyngeal Swab     Status: None   Collection Time: 08/08/19  9:43 AM   Specimen: Nasopharyngeal Swab  Result Value Ref Range Status   SARS Coronavirus 2 by RT PCR NEGATIVE NEGATIVE Final    Comment: (NOTE) SARS-CoV-2 target nucleic acids are NOT DETECTED. The SARS-CoV-2 RNA is generally detectable in upper respiratoy specimens during the acute phase of infection. The lowest concentration of SARS-CoV-2 viral copies this assay can detect is 131 copies/mL. A negative result does not preclude SARS-Cov-2 infection and should not be used as the sole basis for treatment or other patient management decisions. A negative result may occur with  improper specimen collection/handling, submission of specimen other than nasopharyngeal swab, presence of  viral mutation(s) within the areas targeted by this assay, and inadequate number of viral copies (<131 copies/mL). A negative result must be combined with clinical observations, patient history, and epidemiological information. The expected result is Negative. Fact Sheet for Patients:  PinkCheek.be Fact Sheet for Healthcare Providers:  GravelBags.it This test is not yet ap proved or cleared by the [place] FDA and  has been authorized for detection and/or diagnosis of SARS-CoV-2 by FDA under an Emergency Use Authorization (EUA). This EUA will remain  in effect (meaning this test can be used) for the duration of the COVID-19 declaration under Section 564(b)(1) of the Act, 21 U.S.C. section 360bbb-3(b)(1), unless the authorization is terminated or revoked sooner.    Influenza A by PCR NEGATIVE NEGATIVE Final   Influenza B by PCR NEGATIVE NEGATIVE Final    Comment: (NOTE) The Xpert Xpress SARS-CoV-2/FLU/RSV assay is intended as an aid in  the diagnosis of influenza from Nasopharyngeal swab specimens and  should not be used as a sole basis for treatment. Nasal washings and  aspirates are unacceptable for Xpert Xpress SARS-CoV-2/FLU/RSV  testing. Fact Sheet for Patients: PinkCheek.be Fact Sheet for Healthcare Providers: GravelBags.it This test is not yet approved or cleared by the [place] FDA and  has been authorized for detection and/or diagnosis of SARS-CoV-2 by  FDA under an Emergency Use Authorization (EUA). This EUA will remain  in effect (meaning this test can be used) for the duration of the  Covid-19 declaration under Section 564(b)(1) of the Act, 21  U.S.C. section 360bbb-3(b)(1), unless the authorization is  terminated or revoked. Performed at [facility], [address] [place], [place] [address]   MRSA PCR Screening     Status: None    Collection Time: 08/09/19  9:11 PM   Specimen: Nasal Mucosa; Nasopharyngeal  Result Value Ref Range Status   MRSA by PCR NEGATIVE NEGATIVE Final    Comment:        The GeneXpert MRSA Assay (FDA approved for NASAL specimens only), is one component of a comprehensive MRSA colonization surveillance program. It is not intended to diagnose MRSA infection nor to guide or monitor treatment for MRSA infections. Performed at [facility],  [address], [place], [place] [address]     Coagulation Studies: Recent Labs    08/13/19 0448  LABPROT 16.3*  INR 1.4*    Urinalysis: No results for input(s): COLORURINE, LABSPEC, PHURINE, GLUCOSEU, HGBUR, BILIRUBINUR, KETONESUR, PROTEINUR, UROBILINOGEN, NITRITE, LEUKOCYTESUR in the last 72 hours.  Invalid input(s): APPERANCEUR    Imaging: DG Chest Port 1 View  Result Date: 08/13/2019 CLINICAL DATA:  Congestive heart failure EXAM: PORTABLE CHEST 1 VIEW COMPARISON:  Aug 11, 2019 FINDINGS: There is underlying fibrotic change. There is no frank edema or consolidation. There is mild cardiac prominence with pulmonary vascularity normal. Pacemaker leads attached to right atrium and right ventricle. Status post coronary artery bypass grafting. Central catheter tip in superior vena cava. No pneumothorax. No bone lesions. No adenopathy evident. IMPRESSION: No appreciable change from 2 days prior. Stable cardiac prominence. Stable pacemaker lead placements. Central catheter tip in superior vena cava. Underlying fibrotic change without frank edema or consolidation evident. Electronically Signed   By: Bretta Bang III M.D.   On: 08/13/2019 07:54     Medications:   . sodium chloride    . DOBUTamine 5.0026 mcg/kg/min (08/13/19 1600)  . phenylephrine (NEO-SYNEPHRINE) Adult infusion 100 mcg/min (08/13/19 1600)   . aspirin EC  81 mg Oral Daily  . Chlorhexidine Gluconate Cloth  6 each Topical Daily  . feeding supplement (GLUCERNA SHAKE)  237 mL  Oral BID BM  . insulin aspart  0-15 Units Subcutaneous TID WC  . magnesium oxide  400 mg Oral Daily  . metoprolol succinate  12.5 mg Oral Daily  . midodrine  10 mg Oral TID WC  . mometasone-formoterol  2 puff Inhalation BID  . multivitamin with minerals  1 tablet Oral Daily  . omega-3 acid ethyl esters  1 g Oral Daily  . patiromer  16.8 g Oral Daily  . Ensure Max Protein  11 oz Oral Daily  . sodium chloride flush  3 mL Intravenous Q12H   sodium chloride, acetaminophen **OR** acetaminophen, furosemide, levalbuterol, metoprolol tartrate, ondansetron **OR** ondansetron (ZOFRAN) IV, sodium chloride flush  Assessment/ Plan:  74 y.o. male with past medical history of ischemic cardiomyopathy status post CABG 1991 and stent placement in 2002, ICD placement 2021, diabetes mellitus type 2, hypertension, hyperlipidemia, history of COVID-19 infection admitted now with hyponatremia, acute on chronic systolic heart failure, acute kidney injury.  1.  Acute kidney injury.  Baseline creatinine 0.89 on 07/18/2019.  Since that time renal function has been fluctuating with creatinine between 2.1-2.2.  Suspect cardiorenal cause for his underlying acute kidney injury now.  Renal ultrasound negative for obstruction. -Creatinine down to 1.87 and urine output 646 cc over the preceding 24 hours.  Therefore no urgent indication for dialysis at the moment.  Continue to monitor renal parameters closely.  2.  Acute on chronic systolic heart failure exacerbation.  Patient remains on dobutamine and phenylephrine for inotropic support at the moment.  Not on pressors at the moment.  3.  Hyperkalemia.  Serum potassium normalized to 4.7.  Okay to continue Veltassa 16.8 g p.o. daily.  4.  Hypotension.  Continue dobutamine, phenylephrine, and midodrine.   LOS: 5 Dorthia Tout 5/8/20215:17 PM

## 2019-08-13 NOTE — Progress Notes (Signed)
Patient resting off and on overnight, wife stayed at bedside. Metoprolol 2.5mg  given IV x1 for HR >than 150 with marked improvement. Family updated on plans for transfer to [facility]. Patient remains on waiting list, accepting physician is Dr. Gerre Scull Cardiac ICU. See [facility] for further update

## 2019-08-13 NOTE — Progress Notes (Signed)
Patient was previously accepted for transfer to [facility] stepdown unit given need for advanced Heart Failure treatment, however on 08/11/2019 he decompensated requiring transfer to ICU for initiation of inotropes and vasopressors.  On 08/12/2019 bed was available at [facility] stepdown, however given his upgrade in status, transfer was canceled.  This morning, spoke with [facility] ICU physician Dr. Gerre Scull, and he has graciously accepted patient for transfer to their cardiac ICU.  [facility] transfer center will call us when bed is available.  Have updated patient and his wife on accepted ICU bed, however awaiting bed availability.   Harlon Ditty, AGACNP-BC [place] [facility] Pager: [phone]

## 2019-08-13 NOTE — Progress Notes (Signed)
[facility] Encounter Note  Patient: Matthew Wolf / Admit Date: 08/08/2019 / Date of Encounter: 08/13/2019, 7:26 AM   Subjective: Patient appears to be relatively stable today with no evidence of significant further rhythm disturbances this a.m. and or worsening symptoms but apparently had a short run of AV node reentrant tachycardia last night.     In addition to that the patient continues to be on   inotropes including dobutamine with midodrine and Lasix combination for congestive heart failure which is unchanged from before.  The patient has not been able to tolerate dopamine due to rapid heart rate.  Patient currently is awaiting tertiary care treatment without any significant change in condition throughout today  Review of Systems: Positive for: Weakness shortness of breath Negative for: Vision change, hearing change, syncope, dizziness, nausea, vomiting,diarrhea, bloody stool, stomach pain, cough, congestion, diaphoresis, urinary frequency, urinary pain,skin lesions, skin rashes Others previously listed  Objective: Telemetry: Normal sinus rhythm Physical Exam: Blood pressure 96/65, pulse (!) 101, temperature (!) 94.7 F (34.8 C), temperature source Axillary, resp. rate 19, height 5\' 7"  (1.702 m), weight 65.3 kg, SpO2 100 %. Body mass index is 22.55 kg/m. General: Well developed, well nourished, in no acute distress. Head: Normocephalic, atraumatic, sclera non-icteric, no xanthomas, nares are without discharge. Neck: No apparent masses Lungs: Normal respirations with no wheezes, few rhonchi, no rales , basilar crackles   Heart: Regular rate and rhythm, normal S1 S2, no murmur, no rub, no gallop, PMI is normal size and placement, carotid upstroke normal without bruit, jugular venous pressure normal Abdomen: Soft, non-tender, non-distended with normoactive bowel sounds. No hepatosplenomegaly. Abdominal aorta is normal size without bruit Extremities: Trace edema, no  clubbing, no cyanosis, no ulcers,  Peripheral: 2+ radial, 2+ femoral, 2+ dorsal pedal pulses Neuro: Alert and oriented. Moves all extremities spontaneously. Psych:  Responds to questions appropriately with a normal affect.   Intake/Output Summary (Last 24 hours) at 08/13/2019 0726 Last data filed at 08/13/2019 0600 Gross per 24 hour  Intake 837.79 ml  Output 646 ml  Net 191.79 ml    Inpatient Medications:  . aspirin EC  81 mg Oral Daily  . Chlorhexidine Gluconate Cloth  6 each Topical Daily  . feeding supplement (GLUCERNA SHAKE)  237 mL Oral BID BM  . insulin aspart  0-15 Units Subcutaneous TID WC  . magnesium oxide  400 mg Oral Daily  . metoprolol succinate  12.5 mg Oral Daily  . midodrine  10 mg Oral TID WC  . mometasone-formoterol  2 puff Inhalation BID  . multivitamin with minerals  1 tablet Oral Daily  . omega-3 acid ethyl esters  1 g Oral Daily  . patiromer  16.8 g Oral Daily  . Ensure Max Protein  11 oz Oral Daily  . sodium chloride flush  3 mL Intravenous Q12H   Infusions:  . sodium chloride    . DOBUTamine 5.0026 mcg/kg/min (08/13/19 0600)  . phenylephrine (NEO-SYNEPHRINE) Adult infusion 100 mcg/min (08/13/19 0600)    Labs: Recent Labs    08/12/19 0500 08/12/19 0500 08/12/19 1409 08/13/19 0448  NA 124*   < > 123* 123*  K 5.2*   < > 5.1 4.7  CL 92*   < > 90* 92*  CO2 24   < > 24 24  GLUCOSE 217*   < > 267* 196*  BUN 72*   < > 72* 66*  CREATININE 2.20*   < > 2.14* 1.87*  CALCIUM 8.2*   < >  8.2* 8.4*  MG 2.2  --   --  2.0   < > = values in this interval not displayed.   Recent Labs    08/12/19 0500 08/13/19 0448  AST 1,216* 1,107*  ALT 1,217* 1,670*  ALKPHOS 400* 335*  BILITOT 2.6* 1.9*  PROT 5.2* 5.1*  ALBUMIN 2.8* 2.7*   Recent Labs    08/12/19 0500 08/13/19 0448  WBC 11.5* 10.6*  NEUTROABS 9.7* 8.3*  HGB 11.2* 11.7*  HCT 33.1* 33.4*  MCV 94.6 92.3  PLT 86* 74*   No results for input(s): CKTOTAL, CKMB, TROPONINI in the last 72  hours. Invalid input(s): POCBNP No results for input(s): HGBA1C in the last 72 hours.   Weights: Filed Weights   08/08/19 0739 08/09/19 0401 08/09/19 2100  Weight: 59 kg 63.5 kg 65.3 kg     Radiology/Studies:  DG Chest 1 View  Result Date: 08/09/2019 CLINICAL DATA:  Shortness of breath. EXAM: CHEST  1 VIEW COMPARISON:  08/08/2019 FINDINGS: Slight worsening lung aeration likely pulmonary edema. Underlying severe chronic lung disease. No definite pleural effusions. IMPRESSION: Worsening lung aeration, likely pulmonary edema. Electronically Signed   By: Marijo Sanes M.D.   On: 08/09/2019 20:35   US RENAL  Result Date: 08/11/2019 CLINICAL DATA:  Acute renal failure. EXAM: RENAL / URINARY TRACT ULTRASOUND COMPLETE COMPARISON:  July 26, 2013. FINDINGS: Right Kidney: Renal measurements: 11.0 x 5.3 x 4.8 cm = volume: 148 mL . Echogenicity within normal limits. No mass or hydronephrosis visualized. Left Kidney: Renal measurements: 12.2 x 6.2 x 4.5 cm = volume: 177 mL. Echogenicity within normal limits. No mass or hydronephrosis visualized. Bladder: Appears normal for degree of bladder distention. Other: Mild ascites is noted. IMPRESSION: No significant renal abnormality is noted.  Mild ascites is noted. Electronically Signed   By: Marijo Conception M.D.   On: 08/11/2019 10:47   DG Chest Port 1 View  Result Date: 08/11/2019 CLINICAL DATA:  Central catheter placement EXAM: PORTABLE CHEST 1 VIEW COMPARISON:  Aug 09, 2019 FINDINGS: Central catheter tip is in the superior vena cava. No pneumothorax. There is underlying fibrotic type change. There is no appreciable edema or airspace opacity currently. Heart is mildly enlarged with pulmonary vascularity normal. Pacemaker leads are attached to the right atrium and right ventricle. Patient is status post coronary artery bypass grafting. No adenopathy evident. There is aortic atherosclerosis. No bone lesions. IMPRESSION: Underlying fibrotic type change. No frank  edema or airspace opacity. Stable cardiac silhouette. Pacemaker leads attached to right atrium and right ventricle. Status post coronary artery bypass grafting. Aortic Atherosclerosis (ICD10-I70.0). Electronically Signed   By: Lowella Grip III M.D.   On: 08/11/2019 11:32   DG Chest Portable 1 View  Result Date: 08/08/2019 CLINICAL DATA:  Weakness, sick for 3 days, anorexia, fatigue, had COVID-19 4 months ago, question CHF EXAM: PORTABLE CHEST 1 VIEW COMPARISON:  Portable exam 0810 hours compared to 02/09/2019 FINDINGS: LEFT subclavian ICD with leads projecting over RIGHT atrium and RIGHT ventricle. Enlargement of cardiac silhouette post CABG. Atherosclerotic calcification aorta. Scattered infiltrates in the mid to lower lungs, chronic, likely chronic interstitial lung disease with a peripheral and basilar predominance. No definite superimposed infiltrate or pleural effusion. No pneumothorax. IMPRESSION: Chronic interstitial changes in the periphery of both lungs favor chronic interstitial lung disease. Enlargement of cardiac silhouette post CABG and ICD. No acute abnormalities. Aortic Atherosclerosis (ICD10-I70.0). Electronically Signed   By: Lavonia Dana M.D.   On: 08/08/2019 08:28  Assessment and Recommendation  74 y.o. male with known severe dilated cardiomyopathy with acute on chronic systolic dysfunction congestive heart failure with cardiorenal syndrome hypotension and AV node reentrant tachycardia continue to have difficulty but somewhat more stable today 1.  Continuation of dobutamine at this time due to slight improvements in cardiac output and patient may appear to be tolerating relatively well 2.  No use of dopamine due to concerns of AV node reentrant tachycardia and other tachycardia 3.  Pressure support if needed due to hypotension at this time 4.  Furosemide as per nephrology for pulmonary edema 5.  No further cardiac diagnostics at this time 6.  Further treatment options as  necessary at tertiary facility  Signed, Arnoldo Hooker M.D. FACC

## 2019-08-14 DIAGNOSIS — E785 Hyperlipidemia, unspecified: Secondary | ICD-10-CM | POA: Diagnosis not present

## 2019-08-14 DIAGNOSIS — I110 Hypertensive heart disease with heart failure: Secondary | ICD-10-CM | POA: Diagnosis not present

## 2019-08-14 DIAGNOSIS — I5033 Acute on chronic diastolic (congestive) heart failure: Secondary | ICD-10-CM | POA: Diagnosis not present

## 2019-08-14 DIAGNOSIS — I2582 Chronic total occlusion of coronary artery: Secondary | ICD-10-CM | POA: Diagnosis not present

## 2019-08-14 DIAGNOSIS — E871 Hypo-osmolality and hyponatremia: Secondary | ICD-10-CM | POA: Diagnosis not present

## 2019-08-14 DIAGNOSIS — I493 Ventricular premature depolarization: Secondary | ICD-10-CM | POA: Diagnosis not present

## 2019-08-14 DIAGNOSIS — Z9889 Other specified postprocedural states: Secondary | ICD-10-CM | POA: Diagnosis not present

## 2019-08-14 DIAGNOSIS — N390 Urinary tract infection, site not specified: Secondary | ICD-10-CM | POA: Diagnosis not present

## 2019-08-14 DIAGNOSIS — I472 Ventricular tachycardia: Secondary | ICD-10-CM | POA: Diagnosis not present

## 2019-08-14 DIAGNOSIS — J811 Chronic pulmonary edema: Secondary | ICD-10-CM | POA: Diagnosis not present

## 2019-08-14 DIAGNOSIS — E119 Type 2 diabetes mellitus without complications: Secondary | ICD-10-CM | POA: Diagnosis not present

## 2019-08-14 DIAGNOSIS — I255 Ischemic cardiomyopathy: Secondary | ICD-10-CM | POA: Diagnosis not present

## 2019-08-14 DIAGNOSIS — J9621 Acute and chronic respiratory failure with hypoxia: Secondary | ICD-10-CM | POA: Diagnosis not present

## 2019-08-14 DIAGNOSIS — Z950 Presence of cardiac pacemaker: Secondary | ICD-10-CM | POA: Diagnosis not present

## 2019-08-14 DIAGNOSIS — I5023 Acute on chronic systolic (congestive) heart failure: Secondary | ICD-10-CM | POA: Diagnosis not present

## 2019-08-14 DIAGNOSIS — I130 Hypertensive heart and chronic kidney disease with heart failure and stage 1 through stage 4 chronic kidney disease, or unspecified chronic kidney disease: Secondary | ICD-10-CM | POA: Diagnosis not present

## 2019-08-14 DIAGNOSIS — Z7189 Other specified counseling: Secondary | ICD-10-CM | POA: Diagnosis not present

## 2019-08-14 DIAGNOSIS — Y33XXXA Other specified events, undetermined intent, initial encounter: Secondary | ICD-10-CM | POA: Diagnosis not present

## 2019-08-14 DIAGNOSIS — S36119A Unspecified injury of liver, initial encounter: Secondary | ICD-10-CM | POA: Diagnosis not present

## 2019-08-14 DIAGNOSIS — I2510 Atherosclerotic heart disease of native coronary artery without angina pectoris: Secondary | ICD-10-CM | POA: Diagnosis not present

## 2019-08-14 DIAGNOSIS — T82855A Stenosis of coronary artery stent, initial encounter: Secondary | ICD-10-CM | POA: Diagnosis not present

## 2019-08-14 DIAGNOSIS — I5020 Unspecified systolic (congestive) heart failure: Secondary | ICD-10-CM | POA: Diagnosis not present

## 2019-08-14 DIAGNOSIS — I471 Supraventricular tachycardia: Secondary | ICD-10-CM | POA: Diagnosis not present

## 2019-08-14 DIAGNOSIS — I82611 Acute embolism and thrombosis of superficial veins of right upper extremity: Secondary | ICD-10-CM | POA: Diagnosis not present

## 2019-08-14 DIAGNOSIS — R570 Cardiogenic shock: Secondary | ICD-10-CM | POA: Diagnosis not present

## 2019-08-14 DIAGNOSIS — Z8616 Personal history of COVID-19: Secondary | ICD-10-CM | POA: Diagnosis not present

## 2019-08-14 DIAGNOSIS — I509 Heart failure, unspecified: Secondary | ICD-10-CM | POA: Diagnosis not present

## 2019-08-14 DIAGNOSIS — K7200 Acute and subacute hepatic failure without coma: Secondary | ICD-10-CM | POA: Diagnosis not present

## 2019-08-14 DIAGNOSIS — I5043 Acute on chronic combined systolic (congestive) and diastolic (congestive) heart failure: Secondary | ICD-10-CM | POA: Diagnosis not present

## 2019-08-14 DIAGNOSIS — Z951 Presence of aortocoronary bypass graft: Secondary | ICD-10-CM | POA: Diagnosis not present

## 2019-08-14 DIAGNOSIS — N179 Acute kidney failure, unspecified: Secondary | ICD-10-CM | POA: Diagnosis not present

## 2019-08-14 DIAGNOSIS — Z515 Encounter for palliative care: Secondary | ICD-10-CM | POA: Diagnosis not present

## 2019-08-14 DIAGNOSIS — I2584 Coronary atherosclerosis due to calcified coronary lesion: Secondary | ICD-10-CM | POA: Diagnosis not present

## 2019-08-14 DIAGNOSIS — J849 Interstitial pulmonary disease, unspecified: Secondary | ICD-10-CM | POA: Diagnosis not present

## 2019-08-14 DIAGNOSIS — R64 Cachexia: Secondary | ICD-10-CM | POA: Diagnosis not present

## 2019-08-14 DIAGNOSIS — Z95810 Presence of automatic (implantable) cardiac defibrillator: Secondary | ICD-10-CM | POA: Diagnosis not present

## 2019-08-14 DIAGNOSIS — R188 Other ascites: Secondary | ICD-10-CM | POA: Diagnosis not present

## 2019-08-14 MED ORDER — ATORVASTATIN CALCIUM 80 MG PO TABS
80.00 | ORAL_TABLET | ORAL | Status: DC
Start: 2019-08-18 — End: 2019-08-14

## 2019-08-14 MED ORDER — LIDOCAINE HCL 1 % IJ SOLN
0.50 | INTRAMUSCULAR | Status: DC
Start: ? — End: 2019-08-14

## 2019-08-14 MED ORDER — ASPIRIN 81 MG PO TBEC
81.00 | DELAYED_RELEASE_TABLET | ORAL | Status: DC
Start: 2019-08-18 — End: 2019-08-14

## 2019-08-14 MED ORDER — GENERIC EXTERNAL MEDICATION
1.00 | Status: DC
Start: 2019-08-14 — End: 2019-08-14

## 2019-08-14 MED ORDER — ACETAMINOPHEN 325 MG PO TABS
650.00 | ORAL_TABLET | ORAL | Status: DC
Start: ? — End: 2019-08-14

## 2019-08-14 MED ORDER — INSULIN REGULAR HUMAN 100 UNIT/ML IJ SOLN
0.00 | INTRAMUSCULAR | Status: DC
Start: 2019-08-17 — End: 2019-08-14

## 2019-08-14 MED ORDER — GENERIC EXTERNAL MEDICATION
Status: DC
Start: ? — End: 2019-08-14

## 2019-08-14 MED ORDER — ONDANSETRON HCL 4 MG PO TABS
4.00 | ORAL_TABLET | ORAL | Status: DC
Start: ? — End: 2019-08-14

## 2019-08-14 MED ORDER — BUDESONIDE-FORMOTEROL FUMARATE 160-4.5 MCG/ACT IN AERO
2.00 | INHALATION_SPRAY | RESPIRATORY_TRACT | Status: DC
Start: 2019-08-17 — End: 2019-08-14

## 2019-08-14 MED ORDER — NOREPINEPHRINE-SODIUM CHLORIDE 16-0.9 MG/250ML-% IV SOLN
0.01 | INTRAVENOUS | Status: DC
Start: ? — End: 2019-08-14

## 2019-08-14 MED ORDER — DEXTROSE 50 % IV SOLN
12.50 | INTRAVENOUS | Status: DC
Start: ? — End: 2019-08-14

## 2019-08-14 MED ORDER — GENERIC EXTERNAL MEDICATION
125.00 | Status: DC
Start: 2019-08-18 — End: 2019-08-14

## 2019-08-14 MED ORDER — GLUCAGON (RDNA) 1 MG IJ KIT
1.00 | PACK | INTRAMUSCULAR | Status: DC
Start: ? — End: 2019-08-14

## 2019-08-14 MED ORDER — PANTOPRAZOLE SODIUM 40 MG PO TBEC
40.00 | DELAYED_RELEASE_TABLET | ORAL | Status: DC
Start: 2019-08-18 — End: 2019-08-14

## 2019-08-14 MED ORDER — SENNOSIDES-DOCUSATE SODIUM 8.6-50 MG PO TABS
2.00 | ORAL_TABLET | ORAL | Status: DC
Start: 2019-08-18 — End: 2019-08-14

## 2019-08-14 NOTE — Progress Notes (Signed)
[facility] transport team en route for pt transfer to [facility], pt currently resting in bed, NAD.  He remains on dobutamine gtt @5  mcg/kg/min and neo-synephrine gtt @100  mcg/min map remains >65.  Pt sinus tach with frequent PVC's on cardiac monitor.  Family present at bedside.  Will continue to monitor and assess pt.   , AGNP  Pulmonary/Critical Care Pager [phone] (please enter 7 digits) PCCM Consult Pager [phone] (please enter 7 digits)

## 2019-08-15 MED ORDER — GENERIC EXTERNAL MEDICATION
Status: DC
Start: ? — End: 2019-08-15

## 2019-08-15 MED ORDER — MELATONIN 3 MG PO TABS
3.00 | ORAL_TABLET | ORAL | Status: DC
Start: 2019-08-17 — End: 2019-08-15

## 2019-08-15 MED ORDER — GENERIC EXTERNAL MEDICATION
1.00 | Status: DC
Start: 2019-08-17 — End: 2019-08-15

## 2019-08-15 NOTE — Progress Notes (Signed)
CH visited pt. as follow up from prior visit on Thurs.; pt. resting in bed w/blanket from home draped over him.  Pt.'s two dtrs (April, Aggie Cosier), son Oscar Hank., and wife Gigi Gin in [place].  Son said family are processing situation and have been praying for a sense of God's strength; 'It's in the Lord's hands,' son shared.  'We'll accept whatever He decides.'  Pt. seemed weary today; asked for prayer.  CH led family in prayer for pt., gathered around bed.  Pt. still awaiting transfer to [facility].  Family shared cardiologist offered hopeful outlook for pt. yesterday, while hospitalist told them pt. would never come home.  CH remains available as needed and will relay need for potential follow up to evening chaplain.    08/12/19 1500  Clinical Encounter Type  Visited With Patient and family together  Visit Type Follow-up;Spiritual support;Social support;Critical Care  Referral From Family;Nurse  Spiritual Encounters  Spiritual Needs Prayer;Emotional  Stress Factors  Patient Stress Factors Health changes;Major life changes  Family Stress Factors Health changes;Loss of control;Major life changes

## 2019-08-16 MED ORDER — DOBUTAMINE IN D5W 4-5 MG/ML-% IV SOLN
2.50 | INTRAVENOUS | Status: DC
Start: ? — End: 2019-08-16

## 2019-08-16 MED ORDER — GENERIC EXTERNAL MEDICATION
Status: DC
Start: ? — End: 2019-08-16

## 2019-08-17 ENCOUNTER — Ambulatory Visit: Payer: Medicare HMO | Admitting: Family

## 2019-08-17 ENCOUNTER — Other Ambulatory Visit: Payer: Self-pay | Admitting: Pharmacy Technician

## 2019-08-17 MED ORDER — GENERIC EXTERNAL MEDICATION
Status: DC
Start: ? — End: 2019-08-17

## 2019-08-17 MED ORDER — ACETAMINOPHEN 325 MG PO TABS
650.00 | ORAL_TABLET | ORAL | Status: DC
Start: ? — End: 2019-08-17

## 2019-08-17 MED ORDER — GENERIC EXTERNAL MEDICATION
1.00 | Status: DC
Start: 2019-08-18 — End: 2019-08-17

## 2019-08-17 MED ORDER — METOLAZONE 5 MG PO TABS
5.00 | ORAL_TABLET | ORAL | Status: DC
Start: ? — End: 2019-08-17

## 2019-08-17 MED ORDER — INSULIN LISPRO 100 UNIT/ML ~~LOC~~ SOLN
0.00 | SUBCUTANEOUS | Status: DC
Start: 2019-08-17 — End: 2019-08-17

## 2019-08-17 MED ORDER — INSULIN LISPRO 100 UNIT/ML ~~LOC~~ SOLN
3.00 | SUBCUTANEOUS | Status: DC
Start: 2019-08-18 — End: 2019-08-17

## 2019-08-17 MED ORDER — INSULIN GLARGINE 100 UNIT/ML ~~LOC~~ SOLN
5.00 | SUBCUTANEOUS | Status: DC
Start: 2019-08-18 — End: 2019-08-17

## 2019-08-17 MED ORDER — HEPARIN SODIUM (PORCINE) 5000 UNIT/ML IJ SOLN
5000.00 | INTRAMUSCULAR | Status: DC
Start: 2019-08-17 — End: 2019-08-17

## 2019-08-17 MED ORDER — POLYETHYLENE GLYCOL 3350 17 GM/SCOOP PO POWD
17.00 | ORAL | Status: DC
Start: 2019-08-18 — End: 2019-08-17

## 2019-08-17 MED ORDER — GENERIC EXTERNAL MEDICATION
1.00 | Status: DC
Start: ? — End: 2019-08-17

## 2019-08-17 MED ORDER — POLYETHYLENE GLYCOL 3350 17 GM/SCOOP PO POWD
17.00 | ORAL | Status: DC
Start: 2019-08-17 — End: 2019-08-17

## 2019-08-17 MED ORDER — INSULIN LISPRO 100 UNIT/ML ~~LOC~~ SOLN
3.00 | SUBCUTANEOUS | Status: DC
Start: 2019-08-17 — End: 2019-08-17

## 2019-08-17 MED ORDER — SIMETHICONE 80 MG PO CHEW
80.00 | CHEWABLE_TABLET | ORAL | Status: DC
Start: ? — End: 2019-08-17

## 2019-08-17 MED ORDER — FUROSEMIDE 10 MG/ML IJ SOLN
80.00 | INTRAMUSCULAR | Status: DC
Start: 2019-08-17 — End: 2019-08-17

## 2019-08-17 MED ORDER — LACTULOSE 10 GM/15ML PO SOLN
30.00 | ORAL | Status: DC
Start: ? — End: 2019-08-17

## 2019-08-17 MED ORDER — GENERIC EXTERNAL MEDICATION
120.00 | Status: DC
Start: 2019-08-17 — End: 2019-08-17

## 2019-08-17 MED ORDER — GENERIC EXTERNAL MEDICATION
40.00 | Status: DC
Start: 2019-08-17 — End: 2019-08-17

## 2019-08-17 NOTE — Patient Outreach (Signed)
[facility]) Care Management  08/17/2019  Matthew Wolf 1946-01-14 [id number]  Care coordination call placed to [employer] in regards to [facility] application.  Spoke to [place] who informed patient was over income for the program. Informed Matthew Wolf that the number of people in the household was inadvertently written as 2 when it should be 3 according to the 2020 tax form 1040. [place] informed even after changing the number of people in the household the patient was still over income. She informed the decision could be appealed by supplying the patient's OOP amount for the medication. Informed Matthew Wolf that was attached as well. She informed after inputting that information in their system, the appeal is denied as patient would not have spent 3% or more of his income on Jardiance.  Will route note to [facility] Matthew Wolf to inform patient of his denial for Jardiance with [employer] patient assistance program.  Stacie Acres. Matthew Wolf, CPhT [facility]  [phone]

## 2019-08-17 NOTE — Patient Outreach (Signed)
[facility]) Care Management  08/17/2019  Matthew Wolf Jan 18, 1946 [id number]  ADDENDUM  Care coordination call placed to [employer] in regards to [employer] application.  Spoke to [place] who was unable ot locate the information that we faxed to them on 08/11/2019. She requested the information be re faxed. Confirmed fax number with Evie at [employer].  Will re fax application when back in the office later this week. Will follow up in 5-7 days from that date.  [name], CPhT [facility] [employer]  [phone]

## 2019-08-18 ENCOUNTER — Other Ambulatory Visit: Payer: Medicare HMO

## 2019-08-19 MED ORDER — ONDANSETRON HCL 4 MG/2ML IJ SOLN
4.00 | INTRAMUSCULAR | Status: DC
Start: 2019-08-20 — End: 2019-08-19

## 2019-08-19 MED ORDER — HYDROMORPHONE HCL 1 MG/ML IJ SOLN
0.50 | INTRAMUSCULAR | Status: DC
Start: ? — End: 2019-08-19

## 2019-08-19 MED ORDER — MIDAZOLAM HCL 5 MG/5ML IJ SOLN
0.50 | INTRAMUSCULAR | Status: DC
Start: ? — End: 2019-08-19

## 2019-08-19 MED ORDER — GENERIC EXTERNAL MEDICATION
Status: DC
Start: ? — End: 2019-08-19

## 2019-08-19 MED ORDER — GLYCOPYRROLATE 0.4 MG/2ML IJ SOLN
0.20 | INTRAMUSCULAR | Status: DC
Start: ? — End: 2019-08-19

## 2019-08-20 MED ORDER — GENERIC EXTERNAL MEDICATION
Status: DC
Start: ? — End: 2019-08-20

## 2019-08-21 MED ORDER — GENERIC EXTERNAL MEDICATION
Status: DC
Start: ? — End: 2019-08-21

## 2019-08-26 ENCOUNTER — Telehealth: Payer: Self-pay

## 2019-08-31 ENCOUNTER — Ambulatory Visit: Payer: Medicare HMO | Admitting: Nurse Practitioner

## 2019-09-06 DEATH — deceased

## 2019-09-22 ENCOUNTER — Ambulatory Visit: Payer: Medicare HMO | Admitting: Internal Medicine

## 2019-10-17 ENCOUNTER — Ambulatory Visit: Payer: Medicare HMO | Admitting: Nurse Practitioner

## 2021-12-02 IMAGING — CT CT CHEST HIGH RESOLUTION W/O CM
1 of 3 series · 14 of 32 positions shown, 18 images · non-contrast
Comparison: No priors.

CLINICAL DATA: 73-year-old male with history of [id number] infection
in January 2019. Shortness of breath and cough since that time.

EXAM:
CT CHEST WITHOUT CONTRAST
TECHNIQUE: Multidetector CT imaging of the chest was performed following the
standard protocol without intravenous contrast. High resolution
imaging of the lungs, as well as inspiratory and expiratory imaging,
was performed.

[Series 2: thorax · axial · 0.70mm/px · z∈[-574,-316]mm · 14 of 143 slices shown, 18 images]
[im 7/143  mediastinal]
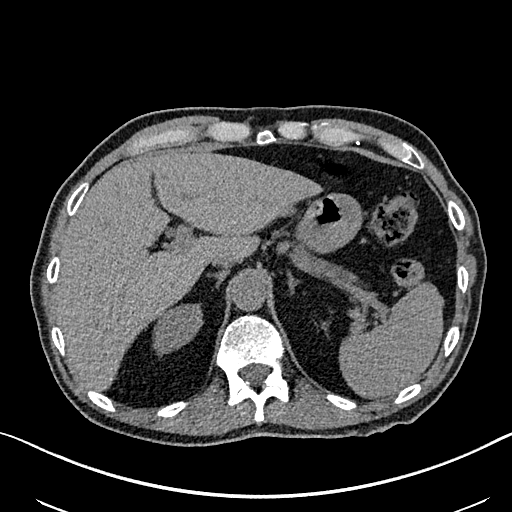
[im 7/143  lung]
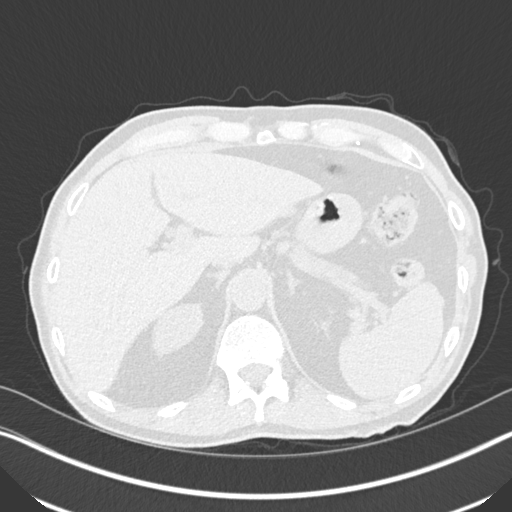
[im 19/143  lung]
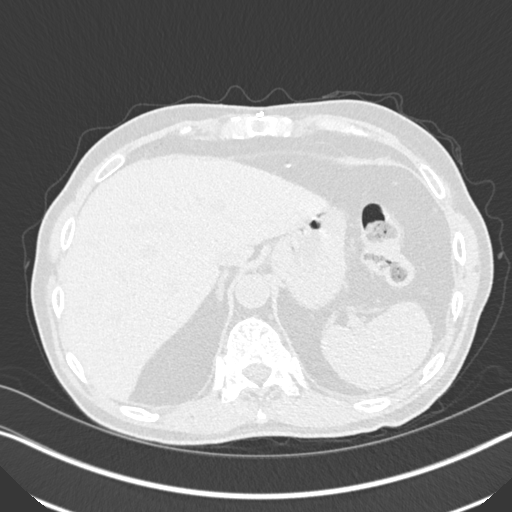
[im 31/143  lung]
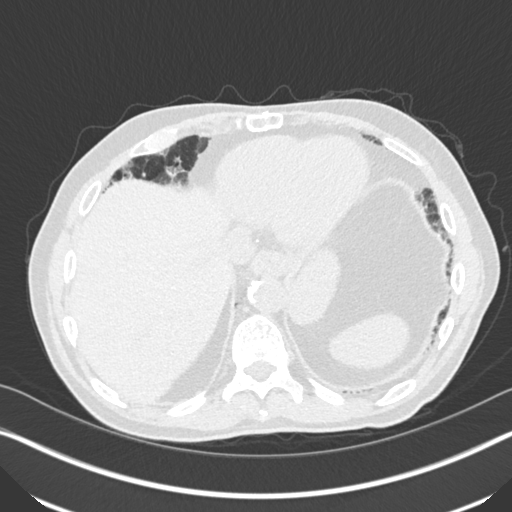
[im 44/143  lung]
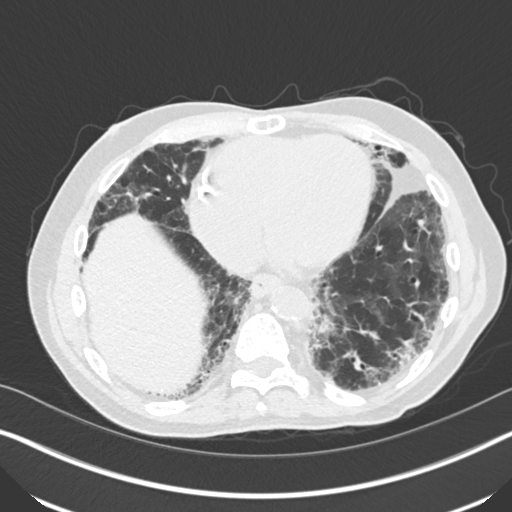
[im 48/143  mediastinal]
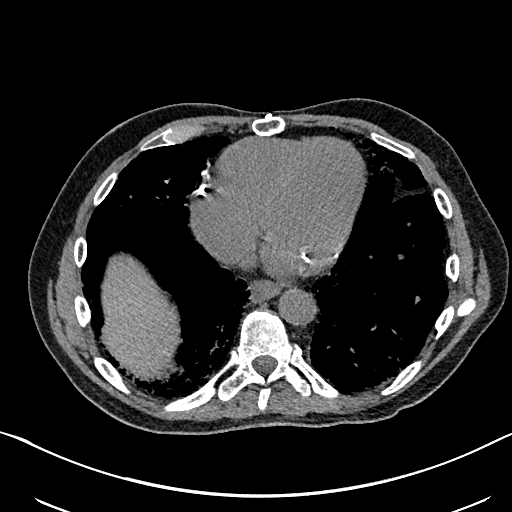
[im 48/143  lung]
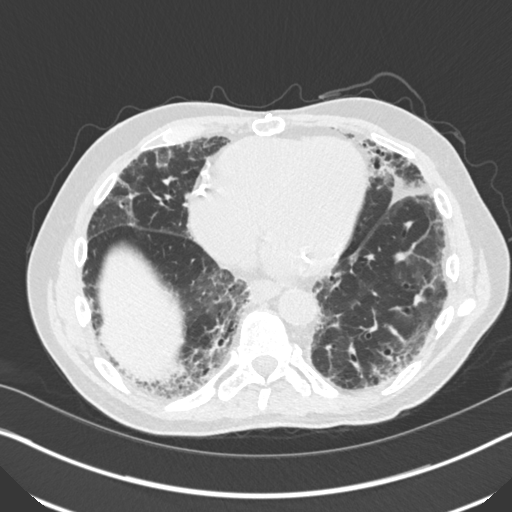
[im 56/143  lung]
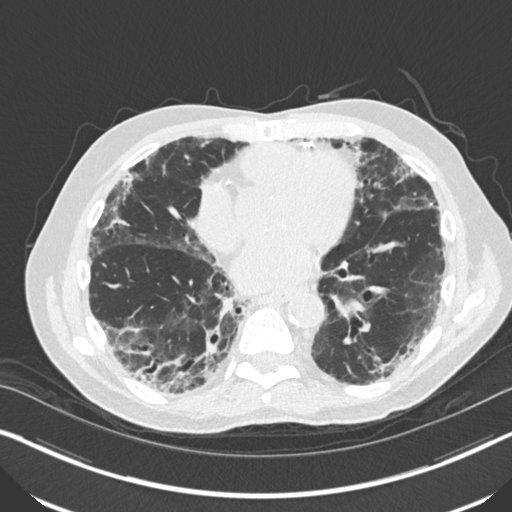
[im 68/143  lung]
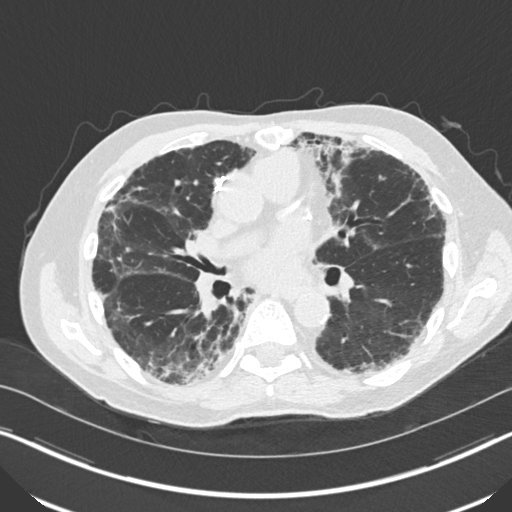
[im 75/143  lung]
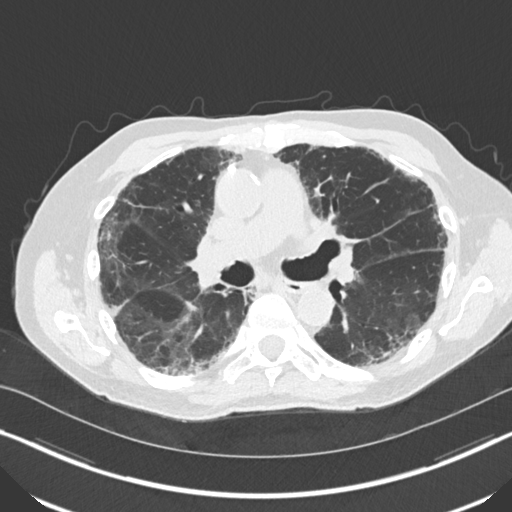
[im 87/143  mediastinal]
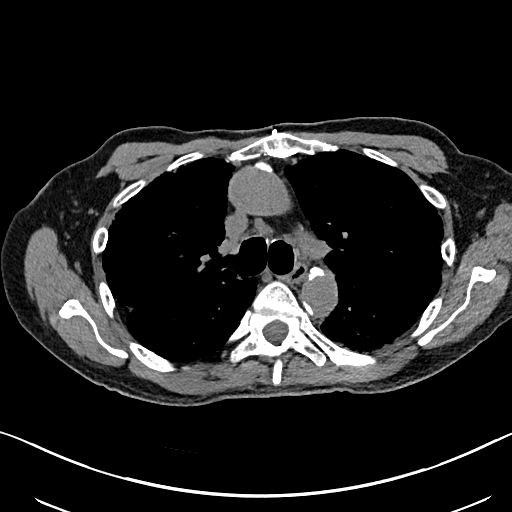
[im 87/143  lung]
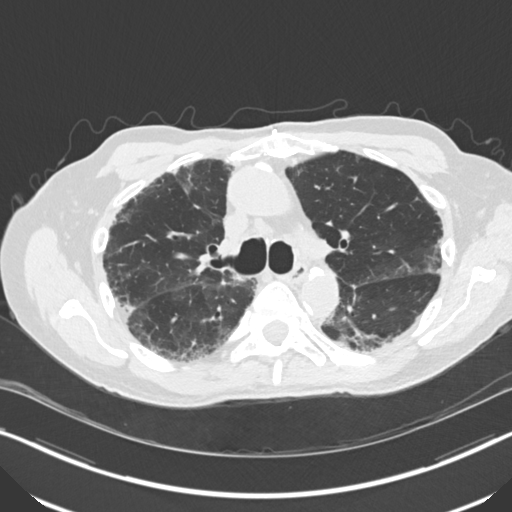
[im 95/143  lung]
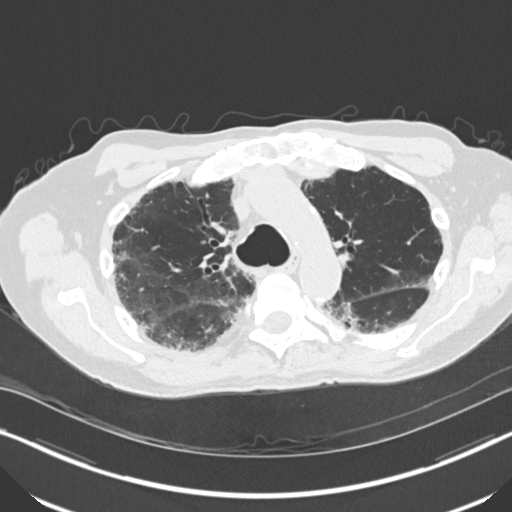
[im 105/143  lung]
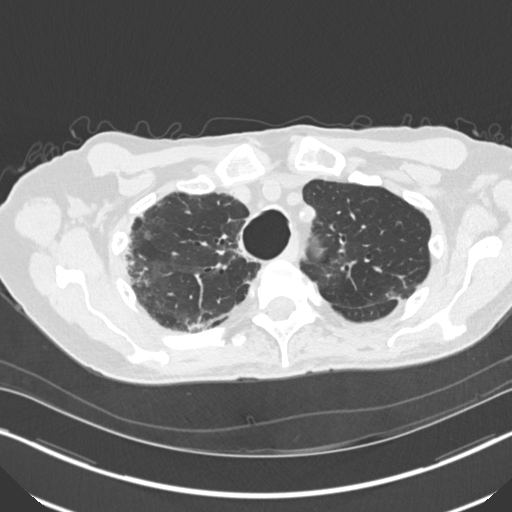
[im 112/143  lung]
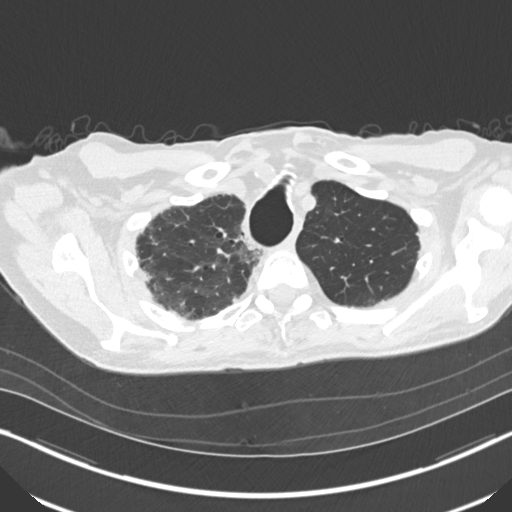
[im 124/143  mediastinal]
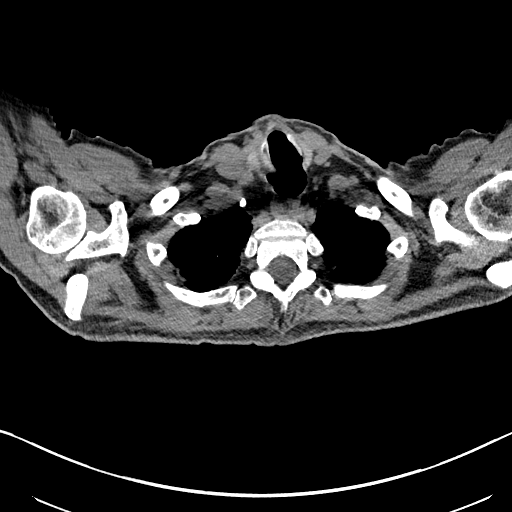
[im 124/143  lung]
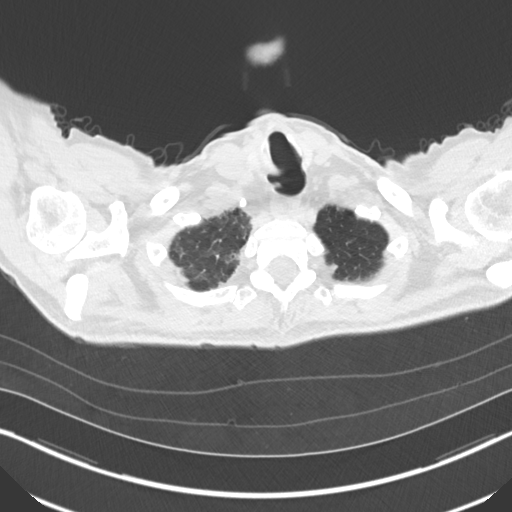
[im 136/143  lung]
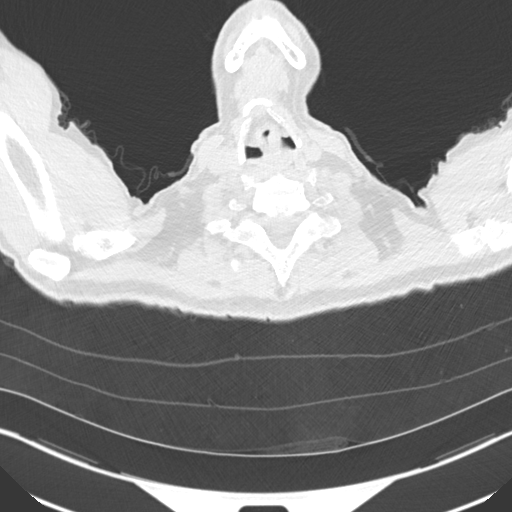

[14 of 32 positions shown; findings below may reference images not displayed]

FINDINGS: Cardiovascular: Heart size is normal. There is no significant
pericardial fluid, thickening or pericardial calcification. There is
aortic atherosclerosis, as well as atherosclerosis of the great
vessels of the mediastinum and the coronary arteries, including
calcified atherosclerotic plaque in the left main, left anterior
descending, left circumflex and right coronary arteries. Coronary
artery stents noted in the left anterior descending, left circumflex
and right coronary arteries. Status post median sternotomy for CABG
including [REDACTED] to the LAD.

Mediastinum/Nodes: No pathologically enlarged mediastinal or hilar
lymph nodes. Please note that accurate exclusion of hilar adenopathy
is limited on noncontrast CT scans. Esophagus is unremarkable in
appearance. No axillary lymphadenopathy.

Lungs/Pleura: Patchy areas of ground-glass attenuation, septal
thickening, subpleural reticulation, traction bronchiectasis, and
peripheral bronchiolectasis in the lungs bilaterally. Findings have
a mild craniocaudal gradient. No definitive honeycombing confidently
identified. Inspiratory and expiratory imaging is unremarkable. No
acute consolidative airspace disease. No pleural effusions. 10 x 4
mm (mean diameter of 7 mm) right middle lobe nodule (axial image 83
of series 3). No other larger more suspicious appearing pulmonary
nodules or masses are noted.

Upper Abdomen: Aortic atherosclerosis.

Musculoskeletal: Median sternotomy wires. There are no aggressive
appearing lytic or blastic lesions noted in the visualized portions
of the skeleton.
IMPRESSION: 1. There are findings in the lungs concerning for interstitial lung
disease, with a spectrum of findings considered probable usual
interstitial pneumonia (UIP) per current ATS guidelines.
2. 7 mm right middle lobe pulmonary nodule (axial image 83 of series
3). Non-contrast chest CT at 6-12 months is recommended. If the
nodule is stable at time of repeat CT, then future CT at 18-24
months (from today's scan) is considered optional for low-risk
patients, but is recommended for high-risk patients. This
recommendation follows the consensus statement: Guidelines for
Management of Incidental Pulmonary Nodules Detected on CT Images:
3. Aortic atherosclerosis, in addition to left main and 3 vessel
coronary artery disease. Status post median sternotomy for CABG
including [REDACTED] to the LAD, as well as PTCI.

Aortic Atherosclerosis ([id number]).
